# Patient Record
Sex: Male | Born: 1935 | Race: White | Hispanic: No | Marital: Married | Smoking: Never smoker
Health system: Southern US, Community
[De-identification: ages and names within clinical notes are randomized; demographics above are authoritative.]

## PROBLEM LIST (undated history)

## (undated) DIAGNOSIS — N183 Chronic kidney disease, stage 3 unspecified: Secondary | ICD-10-CM

## (undated) DIAGNOSIS — Z8679 Personal history of other diseases of the circulatory system: Secondary | ICD-10-CM

## (undated) DIAGNOSIS — G4700 Insomnia, unspecified: Secondary | ICD-10-CM

## (undated) DIAGNOSIS — I5042 Chronic combined systolic (congestive) and diastolic (congestive) heart failure: Secondary | ICD-10-CM

## (undated) DIAGNOSIS — I351 Nonrheumatic aortic (valve) insufficiency: Secondary | ICD-10-CM

## (undated) DIAGNOSIS — E785 Hyperlipidemia, unspecified: Secondary | ICD-10-CM

## (undated) DIAGNOSIS — M8580 Other specified disorders of bone density and structure, unspecified site: Secondary | ICD-10-CM

## (undated) DIAGNOSIS — R296 Repeated falls: Secondary | ICD-10-CM

## (undated) DIAGNOSIS — I272 Pulmonary hypertension, unspecified: Secondary | ICD-10-CM

## (undated) DIAGNOSIS — I10 Essential (primary) hypertension: Secondary | ICD-10-CM

## (undated) DIAGNOSIS — R5383 Other fatigue: Secondary | ICD-10-CM

## (undated) DIAGNOSIS — G4713 Recurrent hypersomnia: Secondary | ICD-10-CM

## (undated) DIAGNOSIS — H9193 Unspecified hearing loss, bilateral: Secondary | ICD-10-CM

## (undated) DIAGNOSIS — R001 Bradycardia, unspecified: Secondary | ICD-10-CM

## (undated) DIAGNOSIS — F0390 Unspecified dementia without behavioral disturbance: Secondary | ICD-10-CM

## (undated) DIAGNOSIS — I480 Paroxysmal atrial fibrillation: Secondary | ICD-10-CM

## (undated) DIAGNOSIS — I071 Rheumatic tricuspid insufficiency: Secondary | ICD-10-CM

## (undated) HISTORY — DX: Personal history of other diseases of the circulatory system: Z86.79

## (undated) HISTORY — DX: Rheumatic tricuspid insufficiency: I07.1

## (undated) HISTORY — DX: Bradycardia, unspecified: R00.1

## (undated) HISTORY — DX: Recurrent hypersomnia: G47.13

## (undated) HISTORY — DX: Chronic kidney disease, stage 3 unspecified: N18.30

## (undated) HISTORY — DX: Other fatigue: R53.83

## (undated) HISTORY — DX: Paroxysmal atrial fibrillation: I48.0

## (undated) HISTORY — DX: Other specified disorders of bone density and structure, unspecified site: M85.80

## (undated) HISTORY — DX: Insomnia, unspecified: G47.00

## (undated) HISTORY — PX: CATARACT EXTRACTION, BILATERAL: SHX1313

## (undated) HISTORY — DX: Chronic combined systolic (congestive) and diastolic (congestive) heart failure: I50.42

## (undated) HISTORY — DX: Unspecified hearing loss, bilateral: H91.93

## (undated) HISTORY — DX: Essential (primary) hypertension: I10

## (undated) HISTORY — DX: Nonrheumatic aortic (valve) insufficiency: I35.1

## (undated) HISTORY — DX: Pulmonary hypertension, unspecified: I27.20

## (undated) HISTORY — DX: Chronic kidney disease, stage 3 (moderate): N18.3

## (undated) HISTORY — DX: Hyperlipidemia, unspecified: E78.5

## (undated) NOTE — *Deleted (*Deleted)
FLUAD vaccine (from pharmacy) administered in right deltoid. Vaccine administration card given to spouse

---

## 1970-08-30 HISTORY — PX: NASAL SEPTUM SURGERY: SHX37

## 1999-12-15 ENCOUNTER — Ambulatory Visit (HOSPITAL_COMMUNITY): Admission: RE | Admit: 1999-12-15 | Discharge: 1999-12-15 | Payer: Self-pay | Admitting: *Deleted

## 2010-02-26 ENCOUNTER — Ambulatory Visit (HOSPITAL_COMMUNITY): Admission: RE | Admit: 2010-02-26 | Discharge: 2010-02-26 | Payer: Self-pay | Admitting: Internal Medicine

## 2010-05-05 ENCOUNTER — Telehealth (INDEPENDENT_AMBULATORY_CARE_PROVIDER_SITE_OTHER): Payer: Self-pay | Admitting: *Deleted

## 2010-05-06 ENCOUNTER — Ambulatory Visit: Payer: Self-pay

## 2010-05-06 ENCOUNTER — Encounter: Payer: Self-pay | Admitting: Cardiovascular Disease

## 2010-05-06 ENCOUNTER — Ambulatory Visit: Payer: Self-pay | Admitting: Cardiovascular Disease

## 2010-05-06 ENCOUNTER — Encounter (HOSPITAL_COMMUNITY): Admission: RE | Admit: 2010-05-06 | Discharge: 2010-05-19 | Payer: Self-pay | Admitting: Cardiovascular Disease

## 2010-07-01 ENCOUNTER — Ambulatory Visit: Payer: Self-pay | Admitting: Cardiovascular Disease

## 2010-09-29 NOTE — Progress Notes (Signed)
Summary: nuc pre procedure  Phone Note Outgoing Call Call back at Home Phone [phone]   Call placed by: Darrick Penna Call placed to: Patient Reason for Call: Confirm/change Appt Summary of Call: Reviewed information on [employer] Information Sheet (see scanned document for further details).  Spoke with patient.      Nuclear Med Background Indications for Stress Test: Evaluation for Ischemia   History: Echo  History Comments: 06/10 Echo nl, LVFand mild AI     Nuclear Pre-Procedure Cardiac Risk Factors: Hypertension, Lipids

## 2010-09-29 NOTE — Assessment & Plan Note (Signed)
Summary: Cardiology Nuclear Testing  Nuclear Med Background Indications for Stress Test: Evaluation for Ischemia   History: Echo  History Comments: .  Symptoms: DOE, Fatigue    Nuclear Pre-Procedure Cardiac Risk Factors: Hypertension, Lipids Caffeine/Decaff Intake: none NPO After: 7:30 AM Lungs: Clear IV 0.9% NS with Angio Cath: 24g     IV Site: L Wrist IV Started by: Bonnita Levan, RN Chest Size (in) 42     Height (in): 71 Weight (lb): 181 BMI: 25.34  Nuclear Med Study 1 or 2 day study:  1 day     Stress Test Type:  Treadmill/Lexiscan Reading MD:  Charlton Haws, MD     Referring MD:  Kristeen Miss, MD Resting Radionuclide:  Technetium 68m Tetrofosmin     Resting Radionuclide Dose:  11 mCi  Stress Radionuclide:  Technetium 65m Tetrofosmin     Stress Radionuclide Dose:  33 mCi   Stress Protocol Exercise Time (min):  5:30 min     Max HR:  123 bpm     Predicted Max HR:  144 bpm       Percent Max HR:  85.42 % Lexiscan: 0.4 mg   Stress Test Technologist:  [employer], CMA-N     Nuclear Technologist:  Domenic Polite, CNMT  Rest Procedure  Myocardial perfusion imaging was performed at rest 45 minutes following the intravenous administration of Technetium 74m Tetrofosmin.  Stress Procedure  The patient initially walked the treadmill utilizing the Bruce protocol for 5:06, but was unable to get his heart rate up, so he was then given IV Lexiscan 0.4 mg over 15-seconds with concurrent exercise and the technetium 25m Tetrofosmin was injected at 30-seconds.  There were no diagnostic ST-T changes with infusion.  There were occasional PVC's and PAC's and he did have a hypotensive response to infusion, relieved in trendelenburg position.  Quantitative spect images were obtained after a 45 minute delay.  QPS Raw Data Images:  Normal; no motion artifact; normal heart/lung ratio. Stress Images:  Normal homogeneous uptake in all areas of the myocardium. Rest Images:  Normal homogeneous  uptake in all areas of the myocardium. Subtraction (SDS):  Normal Transient Ischemic Dilatation:  0.69  (Normal <1.22)  Lung/Heart Ratio:  0.27  (Normal <0.45)  Quantitative Gated Spect Images QGS EDV:  84 ml QGS ESV:  22 ml QGS EF:  73 % QGS cine images:  normal  Findings Low risk nuclear study Clinically Abnormal (chest pain, ST abnormality, hypotension)      Overall Impression  Exercise Capacity: Poor exercise capacity. Lexiscan given at 5 minutes BP Response: Hypotensive blood pressure response. Clinical Symptoms: Dizzy ECG Impression: No significant ST segment change suggestive of ischemia. Overall Impression: Normal nuclear images.  Significance of hypotension with exercise after Lexiscan unknown

## 2010-11-15 LAB — ACTH STIMULATION, 3 TIME POINTS
Cortisol, 60 Min: 24.6 ug/dL (ref >20)
Cortisol, Base: 20.1 ug/dL

## 2011-05-24 ENCOUNTER — Encounter: Payer: Self-pay | Admitting: Cardiovascular Disease

## 2011-05-27 ENCOUNTER — Ambulatory Visit (INDEPENDENT_AMBULATORY_CARE_PROVIDER_SITE_OTHER): Payer: Medicare Other | Admitting: Cardiovascular Disease

## 2011-05-27 ENCOUNTER — Encounter: Payer: Self-pay | Admitting: Cardiovascular Disease

## 2011-05-27 VITALS — BP 126/80 | HR 80 | Ht 71.0 in | Wt 188.0 lb

## 2011-05-27 DIAGNOSIS — I959 Hypotension, unspecified: Secondary | ICD-10-CM

## 2011-05-27 DIAGNOSIS — I951 Orthostatic hypotension: Secondary | ICD-10-CM

## 2011-05-27 NOTE — Progress Notes (Signed)
Randy Murphy Date of Birth  10/30/1931 [facility] [address]    [address] [place], [place]  [address] [phone]  Fax  [phone]  History of Present Illness:  77 year old gentleman with a history of hypertension in the past. More recently he's been having episodes of hypotension. He was diagnosed as having possible early Parkinson's syndrome. He has occasional episodes of very low blood pressure.  He notes that he is fairly fatigued when he has these episodes of hypotension.   Current Outpatient Prescriptions on File Prior to Visit  Medication Sig Dispense Refill  . ALPRAZolam (XANAX) 0.5 MG tablet Take 0.5 mg by mouth at bedtime as needed.        Marland Kitchen aspirin 81 MG tablet Take 81 mg by mouth daily.        . carbidopa-levodopa (SINEMET) 25-100 MG per tablet Take 1 tablet by mouth 3 (three) times daily.        . Coenzyme Q10 (CO Q 10 PO) Take by mouth daily.        . finasteride (PROSCAR) 5 MG tablet Take 5 mg by mouth daily.        . Flaxseed, Linseed, (FLAX SEED OIL PO) Take by mouth 2 (two) times daily.        Marland Kitchen MAGNESIUM PO Take by mouth daily.        . Omega-3 Fatty Acids (OMEGA 3 PO) Take by mouth 2 (two) times daily. 1000 mg each pill        No Known Allergies  Past Medical History  Diagnosis Date  . Hypertension   . Hyperlipidemia   . History of orthostatic hypotension   . Parkinson's disease     Possible early parkinsons disease  . Fatigue   . Insomnia   . Osteopenia     No past surgical history on file.  History  Smoking status  . Unknown If Ever Smoked  Smokeless tobacco  . Not on file    History  Alcohol Use: Not on file    No family history on file.  Reviw of Systems:  Reviewed in the HPI.  All other systems are negative.  Physical Exam: BP 126/80  Pulse 80  Ht 5\' 11"  (1.803 m)  Wt 188 lb (85.276 kg)  BMI 26.22 kg/m2 The patient is alert and oriented x 3.  The mood and affect are normal.   Skin: warm and dry.  Color is normal.      HEENT:   the sclera are nonicteric.  The mucous membranes are moist.  The carotids are 2+ without bruits.  There is no thyromegaly.  There is no JVD.    Lungs: clear.  The chest wall is non tender.    Heart: regular rate with a normal S1 and S2.  There are no murmurs, gallops, or rubs. The PMI is not displaced.     Abdomen: good bowel sounds.  There is no guarding or rebound.  There is no hepatosplenomegaly or tenderness.  There are no masses.   Extremities:  no clubbing, cyanosis, or edema.  The legs are without rashes.  The distal pulses are intact.   Neuro:  Cranial nerves II - XII are intact.  Motor and sensory functions are intact.    The gait is normal.  ECG:  Assessment / Plan:

## 2011-05-27 NOTE — Assessment & Plan Note (Addendum)
He has Parkinson's disease and I suspect that that is the cause of his episodes of hypotension. He will continue to check his blood pressure. I'll see him again in one year. He'll call me sooner if he starts in lots of blood pressure readings that are less than 90.    I've asked him to drink V8 juice and to stay well hydrated. If he continues to have episodes of hypotension we will need to consider starting Midodrine or perhaps Florinef.

## 2011-05-27 NOTE — Patient Instructions (Signed)
Continue to drink V8 juice to keep your BP stable.

## 2011-11-23 ENCOUNTER — Ambulatory Visit: Payer: Medicare Other | Attending: Internal Medicine | Admitting: Physical Therapy

## 2011-11-23 DIAGNOSIS — G20A1 Parkinson's disease without dyskinesia, without mention of fluctuations: Secondary | ICD-10-CM | POA: Insufficient documentation

## 2011-11-23 DIAGNOSIS — R269 Unspecified abnormalities of gait and mobility: Secondary | ICD-10-CM | POA: Insufficient documentation

## 2011-11-23 DIAGNOSIS — IMO0001 Reserved for inherently not codable concepts without codable children: Secondary | ICD-10-CM | POA: Insufficient documentation

## 2011-11-23 DIAGNOSIS — G20 Parkinson's disease: Secondary | ICD-10-CM | POA: Insufficient documentation

## 2011-11-25 ENCOUNTER — Ambulatory Visit: Payer: Medicare Other | Admitting: Physical Therapy

## 2011-11-30 ENCOUNTER — Ambulatory Visit: Payer: Medicare Other | Attending: Internal Medicine | Admitting: Physical Therapy

## 2011-11-30 DIAGNOSIS — IMO0001 Reserved for inherently not codable concepts without codable children: Secondary | ICD-10-CM | POA: Insufficient documentation

## 2011-11-30 DIAGNOSIS — G20 Parkinson's disease: Secondary | ICD-10-CM | POA: Insufficient documentation

## 2011-11-30 DIAGNOSIS — G20A1 Parkinson's disease without dyskinesia, without mention of fluctuations: Secondary | ICD-10-CM | POA: Insufficient documentation

## 2011-11-30 DIAGNOSIS — R269 Unspecified abnormalities of gait and mobility: Secondary | ICD-10-CM | POA: Insufficient documentation

## 2011-12-01 ENCOUNTER — Ambulatory Visit: Payer: Medicare Other | Admitting: Physical Therapy

## 2011-12-07 ENCOUNTER — Ambulatory Visit: Payer: Medicare Other | Admitting: Physical Therapy

## 2011-12-09 ENCOUNTER — Ambulatory Visit: Payer: Medicare Other | Admitting: Physical Therapy

## 2011-12-14 ENCOUNTER — Ambulatory Visit: Payer: Medicare Other | Admitting: Physical Therapy

## 2011-12-16 ENCOUNTER — Ambulatory Visit: Payer: Medicare Other | Admitting: Physical Therapy

## 2011-12-21 ENCOUNTER — Ambulatory Visit: Payer: Medicare Other | Admitting: Physical Therapy

## 2011-12-23 ENCOUNTER — Ambulatory Visit: Payer: Medicare Other | Admitting: Physical Therapy

## 2012-06-12 ENCOUNTER — Ambulatory Visit (HOSPITAL_BASED_OUTPATIENT_CLINIC_OR_DEPARTMENT_OTHER): Payer: Medicare Other | Attending: Psychiatry | Admitting: Radiology

## 2012-06-12 VITALS — Ht 71.0 in | Wt 184.0 lb

## 2012-06-12 DIAGNOSIS — G4733 Obstructive sleep apnea (adult) (pediatric): Secondary | ICD-10-CM | POA: Insufficient documentation

## 2012-06-17 DIAGNOSIS — G4733 Obstructive sleep apnea (adult) (pediatric): Secondary | ICD-10-CM

## 2012-06-17 NOTE — Procedures (Signed)
NAME:  Randy, PIENTA NO.:  [id number]  MEDICAL RECORD NO.:  [id number]          PATIENT TYPE:  OUT  LOCATION:  SLEEP CENTER                 FACILITY:  [facility]  PHYSICIAN:  Rubina Basinski D. Maple Hudson, MD, FCCP, FACPDATE OF BIRTH:  1931/10/02  DATE OF STUDY:  06/12/2012                           NOCTURNAL POLYSOMNOGRAM  REFERRING PHYSICIAN:  IHTSHAM U Murphy  This is an unattended home sleep study.  INDICATION FOR STUDY:  A 78 year old man referred for complaints of excessive daytime somnolence and fatigue with possible insomnia and sleep apnea.  EPWORTH SLEEPINESS SCORE:  8/24.  BMI 26, weight 184 pounds, height 5 feet 11 inches, neck 17 inches.  MEDICATIONS:  Home medications are charted and reviewed.  SLEEP ARCHITECTURE AND RESPIRATORY DATA:  See document in Epic media file.  IMPRESSION: 1. Mild obstructive sleep apnea/hypopnea syndrome, AHI 6 per hour.     The normal range for adults is from 0-5 per hour. 2. Snoring was not recorded by the monitor equipment.  Oxygen     desaturation to a nadir of 88%, with a mean saturation through the     study of 91% on room air. 3. Average heart rate of 59 per minute with maximum of 81 per minute.     Rhythm analysis not available.  RECOMMENDATIONS: 1. The patient indicated he slept poorly on the study night and felt     he was awake much of the night.  Sleep stage assessment is not     provided with this limited monitoring format.  Consider if     difficulty initiating and maintaining sleep (insomnia) is a     significant component of his sleep complaint.  A full sleep center     overnight NPSG study could be an option if appropriate. 2. AHI scores in this range would usually be addressed with     conservative measures if intervention is needed.     Randy Murphy D. Maple Hudson, MD, [facility], FACP Diplomate, American Board of Sleep Medicine   CDY/MEDQ  D:  06/17/2012 10:21:36  T:  06/17/2012 11:55:34  Job:  [id number]

## 2014-02-18 ENCOUNTER — Encounter: Payer: Self-pay | Admitting: Neurology

## 2014-02-18 ENCOUNTER — Ambulatory Visit (INDEPENDENT_AMBULATORY_CARE_PROVIDER_SITE_OTHER): Payer: Medicare Other | Admitting: Neurology

## 2014-02-18 ENCOUNTER — Encounter (INDEPENDENT_AMBULATORY_CARE_PROVIDER_SITE_OTHER): Payer: Self-pay

## 2014-02-18 VITALS — BP 150/75 | HR 63 | Ht 71.0 in | Wt 181.0 lb

## 2014-02-18 DIAGNOSIS — G238 Other specified degenerative diseases of basal ganglia: Secondary | ICD-10-CM

## 2014-02-18 DIAGNOSIS — G903 Multi-system degeneration of the autonomic nervous system: Secondary | ICD-10-CM

## 2014-02-18 DIAGNOSIS — H9190 Unspecified hearing loss, unspecified ear: Secondary | ICD-10-CM

## 2014-02-18 DIAGNOSIS — Z8619 Personal history of other infectious and parasitic diseases: Secondary | ICD-10-CM | POA: Insufficient documentation

## 2014-02-18 DIAGNOSIS — H9193 Unspecified hearing loss, bilateral: Secondary | ICD-10-CM

## 2014-02-18 DIAGNOSIS — G4713 Recurrent hypersomnia: Secondary | ICD-10-CM | POA: Insufficient documentation

## 2014-02-18 DIAGNOSIS — I951 Orthostatic hypotension: Secondary | ICD-10-CM

## 2014-02-18 DIAGNOSIS — Z8661 Personal history of infections of the central nervous system: Secondary | ICD-10-CM

## 2014-02-18 DIAGNOSIS — G20 Parkinson's disease: Secondary | ICD-10-CM | POA: Insufficient documentation

## 2014-02-18 HISTORY — DX: Recurrent hypersomnia: G47.13

## 2014-02-18 NOTE — Patient Instructions (Signed)
Hypersomnia Hypersomnia usually brings recurrent episodes of excessive daytime sleepiness or prolonged nighttime sleep. It is different than feeling tired due to lack of or interrupted sleep at night. People with hypersomnia are compelled to nap repeatedly during the day. This is often at inappropriate times such as:  At work.  During a meal.  In conversation. These daytime naps usually provide no relief. This disorder typically affects adolescents and young adults. CAUSES  This condition may be caused by:  Another sleep disorder (such as narcolepsy or sleep apnea).  Dysfunction of the autonomic nervous system.  Drug or alcohol abuse.  A physical problem, such as:  A tumor.  Head trauma. This is damage caused by an accident.  Injury to the central nervous system.  Certain medications, or medicine withdrawal.  Medical conditions may contribute to the disorder, including:  Multiple sclerosis.  Depression.  Encephalitis.  Epilepsy.  Obesity.  Some people appear to have a genetic predisposition to this disorder. In others, there is no known cause. SYMPTOMS   Patients often have difficulty waking from a long sleep. They may feel dazed or confused.  Other symptoms may include:  Anxiety.  Increased irritation (inflammation).  Decreased energy.  Restlessness.  Slow thinking.  Slow speech.  Loss of appetite.  Hallucinations.  Memory difficulty.  Tremors, Tics.  Some patients lose the ability to function in family, social, occupational, or other settings. TREATMENT  Treatment is symptomatic in nature. Stimulants and other drugs may be used to treat this disorder. Changes in behavior may help. For example, avoid night work and social activities that delay bed time. Changes in diet may offer some relief. Patients should avoid alcohol and caffeine. PROGNOSIS  The likely outcome (prognosis) for persons with hypersomnia depends on the cause of the disorder.  The disorder itself is not life threatening. But it can have serious consequences. For example, automobile accidents can be caused by falling asleep while driving. The attacks usually continue indefinitely. Document Released: 08/06/2002 Document Revised: 11/08/2011 Document Reviewed: 07/10/2008 ExitCare Patient Information 2015 [employer]. This information is not intended to replace advice given to you by your health care provider. Make sure you discuss any questions you have with your health care provider.  

## 2014-02-18 NOTE — Addendum Note (Signed)
Addended by: Melvyn NovasHMEIER, Leigh Kaeding on: 02/18/2014 02:42 PM   Modules accepted: Orders

## 2014-02-18 NOTE — Progress Notes (Signed)
[facility]  Provider:  Melvyn Murphy  Randy Murphy  Referring Provider: Ezequiel Murphy, Randy Murphy Primary Care Physician:  Randy Murphy,Randy Murphy  Chief Complaint  Patient presents with  . New Evaluation    Room 10  . Sleep consult    HPI:  Randy Murphy is Murphy 80 y.o. righthanded, married, caucasian  male , who  is seen here as Murphy referral upon his wifes request, for evaluation of excessive daytime sleepiness and nocturnal insomnia.    Dr. Laurey MoralePerini's notes indicate medical history of regional syndrome, hyperlipidemia, benign prostatic hyperplasia, probably whitecoat phenomenon as his blood pressure has only been elevated when measured in the office.  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  In 2011 he was diagnosed with parkinson's disease . [facility], documented small strokes lacunes - He was placed on Aspirin daily by his neurologist, Randy Murphy .  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 He is not drinking anymore since 2011.  The patent usually goes to bed at 9 PM but watches TV in bed, before he sleeps around 10 Pm. He is waking up at 12 , 2 AM and 4 AM with nocturia. 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 Estimated his usual overnight sleep time at 6 hours only, and in daytime struggles with fatigue.  He has some dysautonomia , orthostatic hypotension, Reynauds and cold hands- not affecting his feet. He has nocturnal leg spasms , cramps.  He exercises on Murphy treat mill daily and than walks his dogs outdoors. In the afternoon he frequently watches TV and sleeps in front of it, his wife reports.  He feels  fatigued when waking in the morning. He has one cup of coffee in AM. He drives for pleasure, only neighborhood area, [place].      Review of Systems: Out of Murphy complete 14 system review, the patient complains of only the following symptoms, and all other reviewed systems are negative. REM BD RLS, cramping . Reynauds. Masked face , dysphonia.  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 He never noted improvement on sinemet.   History   Social History  . Marital Status: Married    Spouse Name: Randy Murphy    Number of Children: 1  . Years of Education: 16   Occupational History  . Not on file.   Social History Main Topics  . Smoking status: Never Smoker   . Smokeless tobacco: Never Used  . Alcohol Use: No  . Drug Use: No  . Sexual Activity: Not on file   Other Topics Concern  . Not on file   Social History Narrative   Patient is married Randy Murphy(Randy Murphy) and lives at home with his wife.   Patient has one adult child.   Patient is retired.   Patient has Murphy college education.   Patient is right-handed.   Patient drinks one cup of coffee daily.    History reviewed. No pertinent family history.  Past Medical History  Diagnosis Date  . Hypertension   . Hyperlipidemia   .  History of orthostatic hypotension   . Parkinson's disease     Possible early parkinsons disease  . Fatigue   . Insomnia   . Osteopenia   . Hearing loss of both ears   . Hypersomnia, periodic 02/18/2014    Past Surgical History  Procedure Laterality Date  . Nasal septum surgery  1972    Current Outpatient Prescriptions  Medication Sig Dispense Refill  . aspirin 81 MG tablet Take 81 mg by mouth daily.        . carbidopa-levodopa (SINEMET) 25-100 MG per tablet Take 1 tablet by mouth 3 (three) times daily.        . Cholecalciferol (VITAMIN Murphy PO) Take by mouth.        . clonazePAM (KLONOPIN) 0.5 MG tablet Take 0.5 mg by mouth at bedtime.      . Coenzyme Q10 (CO  Q 10 PO) Take by mouth daily.        . diphenhydrAMINE (SOMINEX) 25 MG tablet Take 25 mg by mouth daily.      Marland Kitchen donepezil (ARICEPT) 10 MG tablet Take 10 mg by mouth at bedtime.      . finasteride (PROSCAR) 5 MG tablet Take 5 mg by mouth daily.        . Flaxseed, Linseed, (FLAX SEED OIL PO) Take by mouth 2 (two) times daily.        Marland Kitchen MAGNESIUM PO Take by mouth daily.        . Misc Natural Products (PROSTATE HEALTH PO) Take 1 tablet by mouth daily.      . Multiple Vitamins-Minerals (ICAPS AREDS FORMULA PO) Take 1 capsule by mouth 2 (two) times daily.      . Multiple Vitamins-Minerals (MULTIVITAMIN PO) Take 1 tablet by mouth daily.      . Probiotic Product (ALIGN PO) Take 1 tablet by mouth daily.      . sertraline (ZOLOFT) 50 MG tablet Take 50 mg by mouth daily.       No current facility-administered medications for this visit.    Allergies as of 02/18/2014  . (No Known Allergies)    Vitals: BP 150/75  Pulse 63  Ht 5\' 11"  (1.803 m)  Wt 181 lb (82.101 kg)  BMI 25.26 kg/m2 Last Weight:  Wt Readings from Last 1 Encounters:  02/18/14 181 lb (82.101 kg)   Last Height:   Ht Readings from Last 1 Encounters:  02/18/14 5\' 11"  (1.803 m)     Physical exam:  General: The patient is awake, alert and appears not in acute distress. The patient is well groomed. Head: Normocephalic, atraumatic. Neck is supple. Mallampati 2 . Lower on the left , neck circumference: 16.5 inches.  Cardiovascular:  Regular rate and rhythm , without  murmurs or carotid bruit, and without distended neck veins. Respiratory: Lungs are clear to auscultation. Skin:  Without evidence of edema, or rash Trunk: BMI is normal . Hunched posture.  Neurologic exam : The patient is awake and alert, oriented to place and time.  Memory subjective  described as intact. There is Murphy normal attention span & concentration ability.  Speech is fluent with dysphonia . Mood and affect are appropriate.  Cranial nerves: Pupils are equal  and briskly reactive to light. Funduscopic exam without evidence of pallor or edema.  Extraocular movements  in vertical and horizontal planes with coarse saccades and without nystagmus- . Visual fields by finger perimetry are intact. Hearing to finger rub intact.  Facial sensation intact to fine touch. Facial  motor strength - masked face , hypomimia.,symmetric . His  tongue moves midline.  Motor exam:  Cog wheeling over right more than left biceps, elevated  tone but normal muscle bulk and symmetric normal strength in all extremities.  He reports loss of grip strength when opening Murphy jar.  Sensory:  Fine touch, pinprick and vibration were tested in all extremities.  Proprioception is tested in the upper extremities only. This was normal.  Coordination: Rapid alternating movements in the fingers/hands are slowed.  Finger-to-nose maneuver tested - there is no dysmetria and no tremor.  Gait and station: Patient walks without assistive device - stooped posture. Strength within normal limits. Stance is stable and normal.  Deep tendon reflexes: in the  upper and lower extremities are symmetric and intact. Babinski maneuver response is  downgoing.   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   Plan:  Treatment plan and additional workup : 1)  SPLIT study to evaluate for hypoxemia or central apnea.  The patient has REM BD- needs parasomnia montage and CO2. 2) follow yearly with Randy Murphy , I will follow locally.

## 2014-03-28 ENCOUNTER — Ambulatory Visit
Admission: RE | Admit: 2014-03-28 | Discharge: 2014-03-28 | Disposition: A | Discharge status: Home / Self Care | Payer: Medicare Other | Source: Ambulatory Visit | Attending: Neurology | Admitting: Neurology

## 2014-03-28 DIAGNOSIS — I951 Orthostatic hypotension: Secondary | ICD-10-CM

## 2014-03-28 DIAGNOSIS — G903 Multi-system degeneration of the autonomic nervous system: Secondary | ICD-10-CM

## 2014-03-28 DIAGNOSIS — G4713 Recurrent hypersomnia: Secondary | ICD-10-CM

## 2014-03-28 DIAGNOSIS — G890 Central pain syndrome: Secondary | ICD-10-CM

## 2014-03-28 DIAGNOSIS — Z8661 Personal history of infections of the central nervous system: Secondary | ICD-10-CM

## 2014-03-28 DIAGNOSIS — H9193 Unspecified hearing loss, bilateral: Secondary | ICD-10-CM

## 2014-03-28 DIAGNOSIS — Z8619 Personal history of other infectious and parasitic diseases: Secondary | ICD-10-CM

## 2014-03-31 ENCOUNTER — Ambulatory Visit (INDEPENDENT_AMBULATORY_CARE_PROVIDER_SITE_OTHER): Payer: Medicare Other | Admitting: Neurology

## 2014-03-31 DIAGNOSIS — G903 Multi-system degeneration of the autonomic nervous system: Secondary | ICD-10-CM

## 2014-03-31 DIAGNOSIS — H9193 Unspecified hearing loss, bilateral: Secondary | ICD-10-CM

## 2014-03-31 DIAGNOSIS — G4710 Hypersomnia, unspecified: Secondary | ICD-10-CM

## 2014-03-31 DIAGNOSIS — R0902 Hypoxemia: Secondary | ICD-10-CM

## 2014-03-31 DIAGNOSIS — G4713 Recurrent hypersomnia: Secondary | ICD-10-CM

## 2014-03-31 DIAGNOSIS — I951 Orthostatic hypotension: Secondary | ICD-10-CM

## 2014-04-04 ENCOUNTER — Encounter: Payer: Self-pay | Admitting: Neurology

## 2014-04-04 NOTE — Progress Notes (Signed)
Quick Note:  Left message with MRI brain results showing age related changes in brain size and small vessel disease, no focal abnormalities, per Dr. Kandyce Rudomeier. Told to call with further questions. ______

## 2014-04-10 NOTE — Sleep Study (Signed)
See media tab for full report  

## 2014-04-29 ENCOUNTER — Telehealth: Payer: Self-pay | Admitting: Neurology

## 2014-04-29 NOTE — Telephone Encounter (Signed)
Called the patient and left a message for him or his wife to return a call to the office so that sleep study results could be provided.

## 2015-02-24 ENCOUNTER — Other Ambulatory Visit: Payer: Self-pay

## 2017-07-25 ENCOUNTER — Encounter: Payer: Self-pay | Admitting: Neurology

## 2017-08-04 NOTE — Progress Notes (Signed)
Randy Murphy was seen today in the movement disorders clinic for neurologic consultation at the request of Dr. Tonye Royalty.  His PCP is Crist Infante, MD.  This patient is accompanied in the office by his wife who supplements the history.The consultation is for the evaluation of PD.  Pt has also previously seen Dr. Brett Fairy.  I have reviewed neurology records made available to me.  The patient last saw Dr. Tonye Royalty on July 14, 2017.  Records from initial diagnosis are not available.  Patient reports that he was diagnosed in approximately 2011.  His first symptom was falls.  He is currently on carbidopa/levodopa 25/100, 2 tablets four times per day (8am/noon/5pm/3am).  He automatically wakes up at 3am and then takes his medication.  He isn't sure that medication makes a difference (appears that did on/off test and proved efficacy of carbidopa/levodopa at [facility])  Specific Symptoms:  Tremor: No. Family hx of similar:  No. Voice: yes, weaker Sleep: trouble staying asleep  Vivid Dreams:  No.  Acting out dreams:  Yes.  , minor Wet Pillows: Yes.  , minor Postural symptoms:  Yes.    Falls?  Yes.  , last fall was 2 days ago and went down for no reason Bradykinesia symptoms: slow movements and difficulty getting out of a chair; walks fast with walker Loss of smell:  Yes.   Loss of taste:  No. Urinary Incontinence:  No., but has urgency Difficulty Swallowing:  No. Handwriting, micrographia: Yes.   Trouble with ADL's:  No.  Trouble buttoning clothing: Yes.   Depression:  No. Memory changes:  Yes.   (on donepezil; wife takes care of monthly finances; uses pillbox that wife prepares for long time;  Wife drives; pt quit driving x 3-4 years; wife cooks) Hallucinations:  No.  visual distortions: No. N/V:  No. Lightheaded:  Yes.    Syncope: Yes.   (unsure if actually passed out in the past) Diplopia:  No. Dyskinesia:  Yes.   (happens daily to some degree.  Wife notes it at dinner time.  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  Catapres has not been of value for this.  Neuroimaging of the brain has previously been performed.  It is available for my review today.  He had one done on 03/29/14.  There is atrophy and mod small vessel disease.   PREVIOUS MEDICATIONS:  Catapres (for sweating, no help)  ALLERGIES:  No Known Allergies  CURRENT MEDICATIONS:  Outpatient Encounter Medications as of 08/05/2017  Medication Sig  . aspirin 81 MG tablet Take 81 mg by mouth daily.    . carbidopa-levodopa (SINEMET) 25-100 MG per tablet Take 2.5 tablets by mouth 4 (four) times daily. 3 am/ 8 am/ 12 pm/ 5 pm  . Cholecalciferol (VITAMIN D PO) Take by mouth.    . clonazePAM (KLONOPIN) 0.5 MG tablet Take 0.5 mg by mouth at bedtime.  . Coenzyme Q10 (CO Q 10 PO) Take by mouth daily.    Marland Kitchen donepezil (ARICEPT) 10 MG tablet Take 10 mg by mouth at bedtime.  . finasteride (PROSCAR) 5 MG tablet Take 5 mg by mouth daily.    . Flaxseed, Linseed, (FLAX SEED OIL PO) Take by mouth 2 (two) times daily.    Marland Kitchen MAGNESIUM PO Take by mouth daily.    . Melatonin 5 MG TABS Take 5 mg by mouth daily.  . Multiple Vitamins-Minerals (ICAPS AREDS FORMULA PO) Take  1 capsule by mouth 2 (two) times daily.  . Multiple Vitamins-Minerals (MULTIVITAMIN PO) Take 1 tablet by mouth daily.  . Probiotic Product (ALIGN PO) Take 1 tablet by mouth daily.  . TURMERIC PO Take by mouth.  . [DISCONTINUED] diphenhydrAMINE (SOMINEX) 25 MG tablet Take 25 mg by mouth daily.  . [DISCONTINUED] Misc Natural Products (PROSTATE HEALTH PO) Take 1 tablet by mouth daily.  . [DISCONTINUED] sertraline (ZOLOFT) 50 MG tablet Take 50 mg by mouth daily.   No facility-administered encounter medications on file as of 08/05/2017.     PAST MEDICAL HISTORY:   Past Medical History:  Diagnosis Date  . Fatigue   . Hearing loss of both ears   . History of orthostatic hypotension   . Hyperlipidemia   .  Hypersomnia, periodic 02/18/2014  . Hypertension   . Insomnia   . Osteopenia   . Parkinson's disease    Possible early parkinsons disease    PAST SURGICAL HISTORY:   Past Surgical History:  Procedure Laterality Date  . CATARACT EXTRACTION, BILATERAL    . NASAL SEPTUM SURGERY  1972    SOCIAL HISTORY:   Social History   Socioeconomic History  . Marital status: Married    Spouse name: [name]  . Number of children: 1  . Years of education: 10  . Highest education level: Not on file  Social Needs  . Financial resource strain: Not on file  . Food insecurity - worry: Not on file  . Food insecurity - inability: Not on file  . Transportation needs - medical: Not on file  . Transportation needs - non-medical: Not on file  Occupational History  . Not on file  Tobacco Use  . Smoking status: Never Smoker  . Smokeless tobacco: Never Used  Substance and Sexual Activity  . Alcohol use: No  . Drug use: No  . Sexual activity: Not on file  Other Topics Concern  . Not on file  Social History Narrative   Patient is married [name]) and lives at home with his wife.   Patient has one adult child.   Patient is retired.   Patient has a college education.   Patient is right-handed.   Patient drinks one cup of coffee daily.    FAMILY HISTORY:   Family Status  Relation Name Status  . Mother died at 59 Deceased  . Father influenza Deceased  . Sister  Alive  . Son  Alive    ROS:  A complete 10 system review of systems was obtained and was unremarkable apart from what is mentioned above.  PHYSICAL EXAMINATION:    VITALS:   Vitals:   08/05/17 1339  BP: (!) 128/92  Pulse: 74  SpO2: 96%  Weight: 187 lb (84.8 kg)  Height: '5\' 11"'  (1.803 m)    GEN:  The patient appears stated age and is in NAD. HEENT:  Normocephalic, atraumatic.  The mucous membranes are moist. The superficial temporal arteries are without ropiness or tenderness. CV:  RRR Lungs:  CTAB Neck/HEME:  There are no  carotid bruits bilaterally.  Neurological examination:  Orientation: The patient is alert and oriented x3.  I did I did not repeat his Moca since it was just done at [facility] a few weeks ago.  At that point in time his Moca was 12/30. Cranial nerves: There is good facial symmetry. Pupils are equal round and minimally reactive to light bilaterally. Fundoscopic exam is attempted but the disc margins are not well visualized bilaterally. Extraocular muscles  are intact. The visual fields are full to confrontational testing. The speech is fluent and clear. He is hypophonic.  Soft palate rises symmetrically and there is no tongue deviation. Hearing is intact to conversational tone. Sensation: Sensation is intact to light and pinprick throughout (facial, trunk, extremities). Vibration is decreased at the bilateral big toe. There is no extinction with double simultaneous stimulation. There is no sensory dermatomal level identified. Motor: Strength is 5/5 in the bilateral upper and lower extremities.   Shoulder shrug is equal and symmetric.  There is no pronator drift. Deep tendon reflexes: Deep tendon reflexes are 2/4 at the bilateral biceps, triceps, brachioradialis, patella and achilles. Plantar responses are downgoing bilaterally.  Movement examination: Tone: There is normal tone in the bilateral upper extremities.  The tone in the lower extremities is normal.  Abnormal movements: None Coordination:  There is no decremation with RAM's, with any form of RAMS, including alternating supination and pronation of the forearm, hand opening and closing, finger taps, heel taps and toe taps.  He does have some apraxia with rapid alternating movements Gait and Station: The patient has no difficulty arising out of a deep-seated chair without the use of the hands. The patient's stride length is just slightly decreased.  Arm swing is fairly good.    ASSESSMENT/PLAN:  1.   Akinetic idiopathic Parkinson's disease.  The  patient was diagnosed in approximately 2011  -We discussed the diagnosis as well as pathophysiology of the disease.  We discussed treatment options as well as prognostic indicators.  Patient education was provided.  -We discussed that it used to be thought that levodopa would increase risk of melanoma but now it is believed that Parkinsons itself likely increases risk of melanoma. he is to get regular skin checks.  -Greater than 50% of the 60 minute visit was spent in counseling answering questions and talking about what to expect now as well as in the future.  We talked about medication options as well as potential future surgical options.  We talked about safety in the home.  -The patient will continue carbidopa/levodopa 25/100, 2 tablets 4 times per day.  Patient states he is not sure if medication works.  However, it appears that on/off testing at [facility] demonstrate efficacy of medication.  He takes the fourth dosage he is already going to in the middle of the night.  I am not really sure he needs that, as he is really not complaining about symptoms in the middle of the night.  However, I did not change that today.  -I will refer the patient [facility], for PT/ST (including cognitive therapy).  We talked about the importance of safe, cardiovascular exercise in Parkinson's disease.  -We discussed community resources in the area including patient support groups and community exercise programs for PD and pt education was provided to the patient.  He is already going [facility] but encouraged him to go more than 1 day/week.   Also talked about rock steady boxing   -His wife does have respite care  -Met with our social worker today.  Discussed Parkinson's caregiver support group  -His Parkinson's disease has been complicated by dysautonomia (sweating).  Catapres was attempted for this without success.  2.  Memory Loss  -I do think that the patient has  Parkinsons disease dementia.  His Moca was 12/30 in November, 2018.  This is a common part of Parkinson's disease and we discussed this today.  We talked about  the nature, etiology and pathophysiology as well as the degenerative nature.  The patient is on Aricept.  We discussed the importance of mental and physical exercise in the treatment of dementia, and discussed in detail what these things mean.  We discussed community resources and addressed safety in the home.  We discussed caregiver support and caregiver resources.  We offered our PD social worker as part of a support system.  -I am concerned about day/night reversal.  Talked about regular schedule.    3.  Follow up is anticipated in the next few months, sooner should new neurologic issues arise.  Time above did not include the 40 min of record review which was detailed above, which was non face to face time.    Cc:  Crist Infante, MD

## 2017-08-05 ENCOUNTER — Ambulatory Visit: Payer: Medicare Other | Admitting: Neurology

## 2017-08-05 ENCOUNTER — Encounter: Payer: Self-pay | Admitting: Neurology

## 2017-08-05 VITALS — BP 128/92 | HR 74 | Ht 71.0 in | Wt 187.0 lb

## 2017-08-05 DIAGNOSIS — G20 Parkinson's disease: Secondary | ICD-10-CM | POA: Diagnosis not present

## 2017-08-05 DIAGNOSIS — G901 Familial dysautonomia [Riley-Day]: Secondary | ICD-10-CM | POA: Diagnosis not present

## 2017-08-05 DIAGNOSIS — F0280 Dementia in other diseases classified elsewhere without behavioral disturbance: Secondary | ICD-10-CM

## 2017-08-05 NOTE — Patient Instructions (Signed)
1. You have been referred to [facility] for physical and speech therapy. They will call you directly to schedule an appointment.  Please call [phone] if you do not hear from them.    

## 2017-08-29 ENCOUNTER — Other Ambulatory Visit: Payer: Self-pay | Admitting: Neurology

## 2017-08-29 MED ORDER — DONEPEZIL HCL 10 MG PO TABS: 10.0000 mg | ORAL_TABLET | Freq: Every day | ORAL | 1 refills | Status: DC

## 2017-09-02 ENCOUNTER — Telehealth: Payer: Self-pay | Admitting: Neurology

## 2017-09-02 MED ORDER — CLONAZEPAM 0.5 MG PO TABS: 0.5000 mg | ORAL_TABLET | Freq: Every day | ORAL | 1 refills | Status: DC

## 2017-09-02 NOTE — Telephone Encounter (Signed)
RX faxed to [facility].  

## 2017-09-02 NOTE — Telephone Encounter (Signed)
yes

## 2017-09-02 NOTE — Telephone Encounter (Signed)
Find out from patient/wife why taking.  There is some reported RBD in [facility] records but patient/wife said only mild acting out of dreams when here (maybe because he was on med).

## 2017-09-02 NOTE — Telephone Encounter (Signed)
Received request from [facility] to refill Clonazepam 0.5 mg - one at bedtime. You have never prescribed medication. Please advise on refilling.

## 2017-09-02 NOTE — Telephone Encounter (Signed)
I spoke with patient's wife and he does take it for sleep and RBD. Okay to fill?

## 2017-09-13 ENCOUNTER — Ambulatory Visit: Payer: Medicare Other | Attending: Neurology | Admitting: Physical Therapy

## 2017-09-13 ENCOUNTER — Ambulatory Visit: Payer: Medicare Other | Admitting: Speech Pathology

## 2017-09-13 ENCOUNTER — Other Ambulatory Visit: Payer: Self-pay

## 2017-09-13 ENCOUNTER — Encounter: Payer: Self-pay | Admitting: Physical Therapy

## 2017-09-13 DIAGNOSIS — R293 Abnormal posture: Secondary | ICD-10-CM | POA: Diagnosis present

## 2017-09-13 DIAGNOSIS — R2681 Unsteadiness on feet: Secondary | ICD-10-CM | POA: Diagnosis present

## 2017-09-13 DIAGNOSIS — R471 Dysarthria and anarthria: Secondary | ICD-10-CM | POA: Diagnosis present

## 2017-09-13 DIAGNOSIS — R2689 Other abnormalities of gait and mobility: Secondary | ICD-10-CM | POA: Insufficient documentation

## 2017-09-13 NOTE — Therapy (Signed)
[facility] [address] [place], [place], [address] Phone: [phone]   Fax:  [phone]  Physical Therapy Evaluation  Patient Details  Name: Randy Murphy MRN: [id number] Date of Birth: 10/22/1940 Referring Provider: Kerin Salen   Encounter Date: 09/13/2017  PT End of Session - 09/13/17 2244    Visit Number  1    Number of Visits  17    Date for PT Re-Evaluation  11/12/17    Authorization Type  [employer] Medicare    PT Start Time  1149    PT Stop Time  1231    PT Time Calculation (min)  42 min    Activity Tolerance  Patient tolerated treatment well    Behavior During Therapy  [facility] for tasks assessed/performed       Past Medical History:  Diagnosis Date  . Fatigue   . Hearing loss of both ears   . History of orthostatic hypotension   . Hyperlipidemia   . Hypersomnia, periodic 02/18/2014  . Hypertension   . Insomnia   . Osteopenia   . Parkinson's disease    Possible early parkinsons disease    Past Surgical History:  Procedure Laterality Date  . CATARACT EXTRACTION, BILATERAL    . NASAL SEPTUM SURGERY  1972    There were no vitals filed for this visit.   Subjective Assessment - 09/13/17 1156    Subjective  Saw Dr. Arbutus Leas recently, and she referred me here.  Wife reports that its a good thing because his balance is getting worse.  He uses rollator at times, but does not have it with him today.  Pt reports 4-5 falls in the past 6 months.  Falls sometimes occur with just standing there.  Once on the floor, has to call EMS to help get up.    Patient is accompained by:  Family member    Pertinent History  PD diagnosis 2011 (recently evaluated by Dr. Arbutus Leas; has been seen by Dr. Zola Button); osteopenia, HTN, orthostatic hypotension; HOH bilateral    Patient Stated Goals  Pt's goals for physical therapy are to improve balance and moving through house (without walker)    Currently in Pain?  No/denies         [facility] PT  Assessment - 09/13/17 1202      Assessment   Medical Diagnosis  Parkinson's disease    Referring Provider  Tat, Lurena Joiner    Onset Date/Surgical Date  08/05/17      Precautions   Precautions  Fall    Precaution Comments  Is not driving      Balance Screen   Has the patient fallen in the past 6 months  Yes    How many times?  4-5    Has the patient had a decrease in activity level because of a fear of falling?   Yes    Is the patient reluctant to leave their home because of a fear of falling?   Yes      Home Environment   Living Environment  Private residence    Living Arrangements  Spouse/significant other    Available Help at Discharge  Family    Type of Home  House    Home Access  Stairs to enter    Entrance Stairs-Number of Steps  4    Entrance Stairs-Rails  Right;Left    Home Layout  One level    Home Equipment  Walker - 4 wheels;Walker - 2 wheels;Cane - single point  walking poles      Prior Function   Level of Independence  Independent with household mobility with device;Independent with household mobility without device;Independent with community mobility with device Needs assist getting out of shower, with buttons    Vocation  Retired    Leisure  Goes to [employer] once per week, for strengthening; does treadmill every day, walk in the park 30 minutes most days in the park      Cognition   Overall Cognitive Status  History of cognitive impairments - at baseline Per MD report:  MOCA 12/30-PD dementia?      Observation/Other Assessments   Focus on Therapeutic Outcomes (FOTO)   NA      Posture/Postural Control   Posture/Postural Control  Postural limitations    Postural Limitations  Forward head;Rounded Shoulders;Increased thoracic kyphosis;Weight shift left flexed knees bilaterally    Posture Comments  In sitting, pt noted to have dyskinesias in trunk/head      Tone   Assessment Location  --      ROM / Strength   AROM / PROM / Strength  Strength      Strength    Overall Strength Comments  Grossly tested at least 4/5      Transfers   Transfers  Sit to Stand;Stand to Sit    Sit to Stand  6: Modified independent (Device/Increase time);Without upper extremity assist;From chair/3-in-1;5: Supervision    Five time sit to stand comments   13.88 posterior lean on second trial    Stand to Sit  6: Modified independent (Device/Increase time);5: Supervision;Without upper extremity assist;To chair/3-in-1      Ambulation/Gait   Ambulation/Gait  Yes    Ambulation/Gait Assistance  5: Supervision    Ambulation/Gait Assistance Details  No device used during PT eval today; but pt reports using rollator at home and in community    Ambulation Distance (Feet)  200 Feet    Assistive device  None    Gait Pattern  Step-through pattern;Decreased trunk rotation;Narrow base of support    Ambulation Surface  Level;Unlevel    Gait velocity  10.75 sec (3.05 ft/sec); 2nd trial:  8.75 sec (3.75 ft/sec)    Gait velocity - backwards  26.56 sec for 20 ft (0.75 ft/sec)      Standardized Balance Assessment   Standardized Balance Assessment  Timed Up and Go Test      Timed Up and Go Test   Normal TUG (seconds)  12    Cognitive TUG (seconds)  17.59    TUG Comments  Scores >10% in TUG and cognitive TUG indicate difficulty with dual tasking.      High Level Balance   High Level Balance Comments  MiniBEStest score:  12/28 (Scores <22/28 indicate increased fall risk)         Mini-BESTest: Balance Evaluation Systems Test  2005-2013 [facility] & [employer]. All rights reserved. ________________________________________________________________________________________Anticipatory_________Subscore__3___/6 1. SIT TO STAND Instruction: "Cross your arms across your chest. Try not to use your hands unless you must.Do not let your legs lean against the back of the chair when you stand. Please stand up now." X(2) Normal: Comes to stand without use of hands and stabilizes  independently. (1) Moderate: Comes to stand WITH use of hands on first attempt. (0) Severe: Unable to stand up from chair without assistance, OR needs several attempts with use of hands. 2. RISE TO TOES Instruction: "Place your feet shoulder width apart. Place your hands on your hips. Try to rise as high  as you can onto your toes. I will count out loud to 3 seconds. Try to hold this pose for at least 3 seconds. Look straight ahead. Rise now." (2) Normal: Stable for 3 s with maximum height. (1) Moderate: Heels up, but not full range (smaller than when holding hands), OR noticeable instability for 3 s. X(0) Severe: < 3 s. 3. STAND ON ONE LEG Instruction: "Look straight ahead. Keep your hands on your hips. Lift your leg off of the ground behind you without touching or resting your raised leg upon your other standing leg. Stay standing on one leg as long as you can. Look straight ahead. Lift now." Left: Time in Seconds Trial 1:___1.78__Trial 2:__0.78___ (2) Normal: 20 s. X(1) Moderate: < 20 s. (0) Severe: Unable. Right: Time in Seconds Trial 1:__0.75___Trial 2:__2.47___ (2) Normal: 20 s. X(1) Moderate: < 20 s. (0) Severe: Unable To score each side separately use the trial with the longest time. To calculate the sub-score and total score use the side [left or right] with the lowest numerical score [i.e. the worse side]. ______________________________________________________________________________________Reactive Postural Control___________Subscore:___2__/6 4. COMPENSATORY STEPPING CORRECTION- FORWARD Instruction: "Stand with your feet shoulder width apart, arms at your sides. Lean forward against my hands beyond your forward limits. When I let go, do whatever is necessary, including taking a step, to avoid a fall." (2) Normal: Recovers independently with a single, large step (second realignment step is allowed). (1) Moderate: More than one step used to recover equilibrium. X(0) Severe: No  step, OR would fall if not caught, OR falls spontaneously. 5. COMPENSATORY STEPPING CORRECTION- BACKWARD Instruction: "Stand with your feet shoulder width apart, arms at your sides. Lean backward against my hands beyond your backward limits. When I let go, do whatever is necessary, including taking a step, to avoid a fall." (2) Normal: Recovers independently with a single, large step. (1) Moderate: More than one step used to recover equilibrium. X(0) Severe: No step, OR would fall if not caught, OR falls spontaneously. 6. COMPENSATORY STEPPING CORRECTION- LATERAL Instruction: "Stand with your feet together, arms down at your sides. Lean into my hand beyond your sideways limit. When I let go, do whatever is necessary, including taking a step, to avoid a fall." Left X(2) Normal: Recovers independently with 1 step (crossover or lateral OK). (1) Moderate: Several steps to recover equilibrium. (0) Severe: Falls, or cannot step. Right X(2) Normal: Recovers independently with 1 step (crossover or lateral OK). (1) Moderate: Several steps to recover equilibrium. (0) Severe: Falls, or cannot step. Use the side with the lowest score to calculate sub-score and total score. ____________________________________________________________________________________Sensory Orientation_____________Subscore:_____2____/6 7. STANCE (FEET TOGETHER); EYES OPEN, FIRM SURFACE Instruction: "Place your hands on your hips. Place your feet together until almost touching. Look straight ahead. Be as stable and still as possible, until I say stop." Time in seconds:________ X(2) Normal: 30 s. (1) Moderate: < 30 s. (0) Severe: Unable. 8. STANCE (FEET TOGETHER); EYES CLOSED, FOAM SURFACE Instruction: "Step onto the foam. Place your hands on your hips. Place your feet together until almost touching. Be as stable and still as possible, until I say stop. I will start timing when you close your eyes." Time in  seconds:________ (2) Normal: 30 s. (1) Moderate: < 30 s. X(0) Severe: Unable. 9. INCLINE- EYES CLOSED Instruction: "Step onto the incline ramp. Please stand on the incline ramp with your toes toward the top. Place your feet shoulder width apart and have your arms down at your sides. I will start timing  when you close your eyes." Time in seconds:___18.31_____ (2) Normal: Stands independently 30 s and aligns with gravity. X(1) Moderate: Stands independently <30 s OR aligns with surface. (0) Severe: Unable. _________________________________________________________________________________________Dynamic Gait ______Subscore______5__/10 10. CHANGE IN GAIT SPEED Instruction: "Begin walking at your normal speed, when I tell you 'fast', walk as fast as you can. When I say 'slow', walk very slowly." (2) Normal: Significantly changes walking speed without imbalance. X(1) Moderate: Unable to change walking speed or signs of imbalance. (0) Severe: Unable to achieve significant change in walking speed AND signs of imbalance. 11. WALK WITH HEAD TURNS - HORIZONTAL Instruction: "Begin walking at your normal speed, when I say "right", turn your head and look to the right. When I say "left" turn your head and look to the left. Try to keep yourself walking in a straight line." (2) Normal: performs head turns with no change in gait speed and good balance. X(1) Moderate: performs head turns with reduction in gait speed. (0) Severe: performs head turns with imbalance. 12. WALK WITH PIVOT TURNS Instruction: "Begin walking at your normal speed. When I tell you to 'turn and stop', turn as quickly as you can, face the opposite direction, and stop. After the turn, your feet should be close together." (2) Normal: Turns with feet close FAST (< 3 steps) with good balance. X(1) Moderate: Turns with feet close SLOW (>4 steps) with good balance. (0) Severe: Cannot turn with feet close at any speed without imbalance. 13.  STEP OVER OBSTACLES Instruction: "Begin walking at your normal speed. When you get to the box, step over it, not around it and keep walking." (2) Normal: Able to step over box with minimal change of gait speed and with good balance. X(1) Moderate: Steps over box but touches box OR displays cautious behavior by slowing gait. (0) Severe: Unable to step over box OR steps around box. 14. TIMED UP & GO WITH DUAL TASK [3 METER WALK] Instruction TUG: "When I say 'Go', stand up from chair, walk at your normal speed across the tape on the floor, turn around, and come back to sit in the chair." Instruction TUG with Dual Task: "Count backwards by threes starting at ___. When I say 'Go', stand up from chair, walk at your normal speed across the tape on the floor, turn around, and come back to sit in the chair. Continue counting backwards the entire time." TUG: ____12____seconds; Dual Task TUG: ___17.59_____seconds (2) Normal: No noticeable change in sitting, standing or walking while backward counting when compared to TUG without Dual Task. X(1) Moderate: Dual Task affects either counting OR walking (>10%) when compared to the TUG without Dual Task. (0) Severe: Stops counting while walking OR stops walking while counting. When scoring item 14, if subject's gait speed slows more than 10% between the TUG without and with a Dual Task the score should be decreased by a point. TOTAL SCORE: ___12_____/28      Objective measurements completed on examination: See above findings.                PT Short Term Goals - 09/13/17 2251      PT SHORT TERM GOAL #1   Title  Pt will perform HEP with family's supervision, for improved balance, transfers, and gait.  TARGET 10/14/17    Time  4    Period  Weeks    Status  New    Target Date  10/14/17      PT SHORT TERM GOAL #2  Title  Pt will improve TUG cognitive score to less or equal to 15 seconds for decreased fall risk.    Time  4    Period   Weeks    Status  New    Target Date  10/14/17      PT SHORT TERM GOAL #3   Title  Pt will improve MiniBESTest score to at least 16/28 for decreased fall risk.    Time  4    Period  Weeks    Status  New    Target Date  10/14/17      PT SHORT TERM GOAL #4   Title  Pt will improve walking speed in posterior direction to at least 1 ft/sec for improved balance recovery in posterior direction.    Time  4    Period  Weeks    Status  New    Target Date  10/14/17      PT SHORT TERM GOAL #5   Title  Pt will perform floor>stand transfer with UE support with moderate assistance for improved fall recovery.    Time  4    Period  Weeks    Status  New    Target Date  10/14/17        PT Long Term Goals - 09/13/17 2302      PT LONG TERM GOAL #1   Title  Pt/wife will verbalize understanding of fall prevention in home environment.  TARGET 11/11/17    Time  8    Period  Weeks    Status  New    Target Date  11/11/17      PT LONG TERM GOAL #2   Title  Pt will improve MiniBESTest score to at least 20/28 for decreased fall risk.    Time  8    Period  Weeks    Status  New    Target Date  11/11/17      PT LONG TERM GOAL #3   Title  Pt will perform at least 8 of 10 reps of sit<>stand transfers from 18" surface, no posterior lean with minimal to no UE support.    Time  8    Period  Weeks    Status  New    Target Date  11/11/17      PT LONG TERM GOAL #4   Title  Pt will ambulate household distances, 100-200 ft, using rollator walker modified independently, negotiating furniture and narrow spaces, for improved independence with gait in the home.    Time  8    Period  Weeks    Status  New    Target Date  11/11/17             Plan - 09/13/17 2246    Clinical Impression Statement  Pt is an 84 year old male who presents to OP PT with history of Parkinson's disease, with at least 4-5 falls in the past 6 months.  He presents with decreased balance, abnormal posture, postural instability,  decreased timing and coordination of gait.  Pt is at fall risk per TUG cognitive and MiniBESTest scores.  When he has fallen at home, he has been unable to get up without EMS assist most of the time.  Pt would benefit from skilled PT to address the above stated deficits, to improve functional mobility and to decrease fall risk.    History and Personal Factors relevant to plan of care:  4-5 falls past 6 months, at fall risk per TUG  cog and MiniBESTest scores, >3 co-morbidities    Clinical Presentation  Evolving    Clinical Presentation due to:  PD as progressive disease, recent history of falls    Clinical Decision Making  Moderate    Rehab Potential  Good    Clinical Impairments Affecting Rehab Potential  cognition; good family support    PT Frequency  2x / week    PT Duration  8 weeks plus eval    PT Treatment/Interventions  ADLs/Self Care Home Management;DME Instruction;Balance training;Therapeutic exercise;Therapeutic activities;Functional mobility training;Gait training;Neuromuscular re-education;Patient/family education    PT Next Visit Plan  Initiate HEP to address balance strategies, sit<>stand trasnfers; work on SLS, turns, compliant surfaces; gait training with rollator    Consulted and Agree with Plan of Care  Patient;Family member/caregiver    Family Member Consulted  wife, [name]       Patient will benefit from skilled therapeutic intervention in order to improve the following deficits and impairments:  Abnormal gait, Decreased balance, Decreased mobility, Difficulty walking, Impaired flexibility, Postural dysfunction  Visit Diagnosis: Other abnormalities of gait and mobility  Unsteadiness on feet  Abnormal posture     Problem List Patient Active Problem List   Diagnosis Date Noted  . Hypersomnia, periodic 02/18/2014  . 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 H/O viral encephalitis 02/18/2014  . Hearing loss of both  ears   . Orthostatic hypotension 05/27/2011    MARRIOTT,AMY W. 09/13/2017, 11:07 PM  Gean Maidens., PT   [facility] [address] [place], [place], [address] Phone: [phone]   Fax:  [phone]  Name: OZIAH VITANZA MRN: [id number] Date of Birth: 08-Nov-1931

## 2017-09-13 NOTE — Therapy (Signed)
[facility] [address] [place], [place], [address] Phone: [phone]   Fax:  [phone]  Speech Language Pathology Evaluation  Patient Details  Name: Randy Murphy MRN: [id number] Date of Birth: 06/25/1932 Referring Provider: Dr. Lurena Joinerebecca Tat   Encounter Date: 09/13/2017  End of Session - 09/13/17 1315    Visit Number  1    Number of Visits  17    Date for SLP Re-Evaluation  11/11/17    SLP Start Time  1230    SLP Stop Time   1312    SLP Time Calculation (min)  42 min    Activity Tolerance  Patient tolerated treatment well       Past Medical History:  Diagnosis Date  . Fatigue   . Hearing loss of both ears   . History of orthostatic hypotension   . Hyperlipidemia   . Hypersomnia, periodic 02/18/2014  . Hypertension   . Insomnia   . Osteopenia   . Parkinson's disease    Possible early parkinsons disease    Past Surgical History:  Procedure Laterality Date  . CATARACT EXTRACTION, BILATERAL    . NASAL SEPTUM SURGERY  1972    There were no vitals filed for this visit.  Subjective Assessment - 09/13/17 1237    Patient is accompained by:  Family member spouse, Randy JonesCarolyn    Currently in Pain?  No/denies         [facility] - 09/13/17 1237      SLP Visit Information   SLP Received On  09/13/17    Referring Provider  Dr. Lurena Joinerebecca Tat    Onset Date  2011    Medical Diagnosis  Parkinson's Disease      Subjective   Subjective  "He fell on the other side of the house and I couldn't hear him yelling for help"    Patient/Family Stated Goal  I'd like to be able to hear him and understand him - volume and clarityu"      General Information   HPI  Records from initial diagnosis are not available.  Patient reports that he was diagnosed in approximately 2011.  His first symptom was falls.  He is currently on carbidopa/levodopa 25/100, 2 tablets four times per day (8am/noon/5pm/3am).  He automatically  wakes up at 3am and then takes his medication.  He isn't sure that medication makes a difference (appears that did on/off test and proved efficacy of car[facility]opa/[facility]opa at [facility]) HOH Bilateral BTE hearing aids Spouse reports 3 "mini" strokes. Per MD notes, spouse has been assisting pt with med management and she has been managing finances for a while due to  Pt with cognitive impairment   Behavioral/Cognition  [facility]    Mobility Status  fall 3 weeks ago      Balance Screen   Has the patient fallen in the past 6 months  Yes    How many times?  6    Has the patient had a decrease in activity level because of a fear of falling?   Yes    Is the patient reluctant to leave their home because of a fear of falling?   Yes      Prior Functional Status   Cognitive/Linguistic Baseline  Baseline deficits    Type of Home  House     Lives With  Spouse    Available Support  Personal care attendant    Vocation  Retired  Cognition   Overall Cognitive Status  History of cognitive impairments - at baseline reduced memory; bradyphrenia;       Oral Motor/Sensory Function   Overall Oral Motor/Sensory Function  Impaired    Labial ROM  Within Functional Limits    Labial Symmetry  Within Functional Limits    Labial Coordination  Reduced    Lingual ROM  Within Functional Limits    Lingual Symmetry  -- deviates right upon protrusion    Lingual Coordination  Reduced    Velum  Impaired right    Mandible  Within Functional Limits      Motor Speech   Overall Motor Speech  Impaired    Respiration  Impaired    Level of Impairment  Phrase    Phonation  Low vocal intensity    Articulation  Impaired    Level of Impairment  Sentence    Intelligibility  Intelligibility reduced    Word  75-100% accurate    Phrase  75-100% accurate    Sentence  75-100% accurate    Conversation  50-74% accurate    Motor Planning  Witnin functional limits    Interfering Components  Hearing loss     Effective Techniques  Increased vocal intensity;Slow rate;Over-articulate                      SLP Education - 09/13/17 1314    Education provided  Yes    Education Details  Loud AH!, Loudn Hey, breath support for volume, environmental compensations to improve intellgibility    Person(s) Educated  Patient;Spouse    Methods  Explanation;Demonstration;Verbal cues;Handout    Comprehension  Verbalized understanding;Returned demonstration;Verbal cues required       SLP Short Term Goals - 09/13/17 1523      SLP SHORT TERM GOAL #1   Title  Pt will average 85dB on loud /a/ and "hey" with occasional min A over 3 sessions    Time  4    Period  Weeks    Status  New      SLP SHORT TERM GOAL #2   Title  Pt will average 68dB in sentence level structured speech tasksk with occasional min A over 2 sessions    Time  4    Period  Weeks    Status  New      SLP SHORT TERM GOAL #3   Title  Pt will average 68dB in simple conversation over 5 minutes with occasional min A over 2 sessions    Time  4    Period  Weeks    Status  New       SLP Long Term Goals - 09/13/17 1525      SLP LONG TERM GOAL #1   Title  Pt will average 85dB on loud /a/ or "hey" over 6 sessions with rare min A    Time  8    Period  Weeks    Status  New      SLP LONG TERM GOAL #2   Title  Pt will average 68dB over 15  minute simple conversation with occasional min A over 2 sessions    Time  8    Period  Weeks    Status  New      SLP LONG TERM GOAL #3   Title  Pt will be audible/intellgible in mildly noisy environment duirng 10 minute simple conversation over 2 sessions, with 2 or less requests for repetition.  Plan - 09/13/17 1523    Clinical Impression Statement  Mr. Randy Murphy, an 84 y.o. male with h/o of PD dx in 2011 is referred for outpt ST due to reduced intelligilbity. He is accompanied by his wife, Randy Murphy. Per spouse, pt fell in the other end of their home and he did not generate enough volume  to call her to the room. He remained on the floor for an hour, until she found him. Today, he presents with severe hypokinetic dysarthria affecting intellgilbity at home, with his wife and family. His wife also reports reduced articulation. Pt and spouse deny any s/s of dysphagia.  Oral motor assessment revealed WFL lingual ROM and reduced lingual strength and coordination. Labial [facility] was [facility] and strength was [facility] Velar ROM appeared reduced right   Measured when a sound level meter was placed 30 cm away from pt's mouth, 8 minutes of conversational speech was reduced today, at average 55dB (WNL= average 70-72dB) with range of 53 to 58dB. Overall speech intelligibility for this listener in a quiet environment was not affected, at approx 100%. Production of loud /a/ averaged 83dB (range of 80  to 85) and usual moderate cues  needed for loudness.   Pt did not rate effort level upon request, with some confusion of what I was asking and my instructions,  for production of loud /a/. In oral reading of simple common social phrases task pt was asked to use the same amount of effort as with loud /a/. Loudness average with this increased effort was 68dBdB (range of 65 to 70) with usual mod A  for loudness. Pt would benefit from skilled ST in order to improve speech intelligibility and pt's QOL.       Patient will benefit from skilled therapeutic intervention in order to improve the following deficits and impairments:   Dysarthria and anarthria    Problem List Patient Active Problem List   Diagnosis Date Noted  . Hypersomnia, periodic 02/18/2014  . 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 H/O viral encephalitis 02/18/2014  . Hearing loss of both ears   . Orthostatic hypotension 05/27/2011    Babe Anthis, Radene Journey MS, CCC-SLP 09/13/2017, 3:29 PM  [facility] [address] [place], [place],  [address] Phone: [phone]   Fax:  [phone]  Name: Randy Murphy MRN: [id number] Date of Birth: May 24, 1932

## 2017-09-13 NOTE — Patient Instructions (Signed)
  Big Breath with Loud "AH!" 5x twice a day  Big breath before each setence  SLOW, LOUD and BIG  HEY!  HEY THERE  Come here Jean RosenthalJackson  Thank you, Marylu LundJanet  How are you, Francis DowseJoel?  Hi, Marilyn  Read 10 from the handout twice a day    Get the persons attention before you speak  Use eye contact and face the person you are speaking to  Be in close proximity to the person you are speaking to  Turn down any noise in the environment such as the TV, walk away from loud appliances, air conditioners, fans, dish washers etc

## 2017-10-07 ENCOUNTER — Emergency Department (HOSPITAL_COMMUNITY)
Admission: EM | Admit: 2017-10-07 | Discharge: 2017-10-07 | Disposition: A | Discharge status: Home / Self Care | Payer: Medicare Other | Attending: Emergency Medicine | Admitting: Emergency Medicine

## 2017-10-07 ENCOUNTER — Encounter (HOSPITAL_COMMUNITY): Payer: Self-pay

## 2017-10-07 ENCOUNTER — Ambulatory Visit: Payer: Medicare Other | Admitting: Physical Therapy

## 2017-10-07 ENCOUNTER — Ambulatory Visit: Payer: Medicare Other

## 2017-10-07 ENCOUNTER — Emergency Department (HOSPITAL_COMMUNITY): Payer: Medicare Other

## 2017-10-07 DIAGNOSIS — S61412A Laceration without foreign body of left hand, initial encounter: Secondary | ICD-10-CM

## 2017-10-07 DIAGNOSIS — Z79899 Other long term (current) drug therapy: Secondary | ICD-10-CM | POA: Diagnosis not present

## 2017-10-07 DIAGNOSIS — Y999 Unspecified external cause status: Secondary | ICD-10-CM | POA: Diagnosis not present

## 2017-10-07 DIAGNOSIS — Y929 Unspecified place or not applicable: Secondary | ICD-10-CM | POA: Diagnosis not present

## 2017-10-07 DIAGNOSIS — Z23 Encounter for immunization: Secondary | ICD-10-CM | POA: Insufficient documentation

## 2017-10-07 DIAGNOSIS — Y9301 Activity, walking, marching and hiking: Secondary | ICD-10-CM | POA: Diagnosis not present

## 2017-10-07 DIAGNOSIS — I10 Essential (primary) hypertension: Secondary | ICD-10-CM | POA: Insufficient documentation

## 2017-10-07 DIAGNOSIS — S0990XA Unspecified injury of head, initial encounter: Secondary | ICD-10-CM | POA: Diagnosis present

## 2017-10-07 DIAGNOSIS — S0083XA Contusion of other part of head, initial encounter: Secondary | ICD-10-CM | POA: Diagnosis not present

## 2017-10-07 DIAGNOSIS — W19XXXA Unspecified fall, initial encounter: Secondary | ICD-10-CM

## 2017-10-07 DIAGNOSIS — W010XXA Fall on same level from slipping, tripping and stumbling without subsequent striking against object, initial encounter: Secondary | ICD-10-CM | POA: Insufficient documentation

## 2017-10-07 DIAGNOSIS — G20 Parkinson's disease: Secondary | ICD-10-CM | POA: Insufficient documentation

## 2017-10-07 DIAGNOSIS — Z7982 Long term (current) use of aspirin: Secondary | ICD-10-CM | POA: Insufficient documentation

## 2017-10-07 MED ORDER — TETANUS-DIPHTH-ACELL PERTUSSIS 5-2.5-18.5 LF-MCG/0.5 IM SUSP
0.5000 mL | Freq: Once | INTRAMUSCULAR | Status: AC
  Administered 2017-10-07: 0.5 mL via INTRAMUSCULAR
  Filled 2017-10-07: qty 0.5

## 2017-10-07 MED ORDER — LIDOCAINE-EPINEPHRINE (PF) 2 %-1:200000 IJ SOLN
10.0000 mL | Freq: Once | INTRAMUSCULAR | Status: AC
  Administered 2017-10-07: 10 mL via INTRADERMAL
  Filled 2017-10-07: qty 20

## 2017-10-07 NOTE — ED Provider Notes (Signed)
[facility] EMERGENCY DEPARTMENT Provider Note   CSN: [id number] Arrival date & time: 10/07/17  1041     History   Chief Complaint Chief Complaint  Patient presents with  . Fall    HPI Randy Murphy is a 84 y.o. male.  HPI  84 year old male with a history of Parkinson's presents after a fall and head injury.  He states he was walking too fast and fell forward and hit his head.  He did not lose consciousness.  He never felt dizzy or lightheaded beforehand.  No chest pain or shortness of breath.  He denies a headache.  X-ray was obtained of his thumb but he states that there is some swelling but no pain.  He also has a small laceration to the left hand.  He is on aspirin but no other blood thinners.  He denies any weakness or numbness.  Past Medical History:  Diagnosis Date  . Fatigue   . Hearing loss of both ears   . History of orthostatic hypotension   . Hyperlipidemia   . Hypersomnia, periodic 02/18/2014  . Hypertension   . Insomnia   . Osteopenia   . Parkinson's disease    Possible early parkinsons disease    Patient Active Problem List   Diagnosis Date Noted  . Hypersomnia, periodic 02/18/2014  . 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 H/O viral encephalitis 02/18/2014  . Hearing loss of both ears   . Orthostatic hypotension 05/27/2011    Past Surgical History:  Procedure Laterality Date  . CATARACT EXTRACTION, BILATERAL    . NASAL SEPTUM SURGERY  1972       Home Medications    Prior to Admission medications   Medication Sig Start Date End Date Taking? Authorizing Provider  aspirin 81 MG tablet Take 81 mg by mouth daily.      [provider]  carbidopa-levodopa (SINEMET) 25-100 MG per tablet Take 2.5 tablets by mouth 4 (four) times daily. 3 am/ 8 am/ 12 pm/ 5 pm    [provider]  Cholecalciferol (VITAMIN D PO) Take by mouth.      [provider]    clonazePAM (KLONOPIN) 0.5 MG tablet Take 1 tablet (0.5 mg total) by mouth at bedtime. 09/02/17   Tat, Octaviano Batty, DO  Coenzyme Q10 (CO Q 10 PO) Take by mouth daily.      [provider]  donepezil (ARICEPT) 10 MG tablet Take 1 tablet (10 mg total) by mouth at bedtime. 08/29/17   Tat, Octaviano Batty, DO  finasteride (PROSCAR) 5 MG tablet Take 5 mg by mouth daily.      [provider]  Flaxseed, Linseed, (FLAX SEED OIL PO) Take by mouth 2 (two) times daily.      [provider]  losartan (COZAAR) 25 MG tablet Take 25 mg by mouth daily. 1/2 tablet every evening    [provider]  MAGNESIUM PO Take by mouth daily.      [provider]  Melatonin 5 MG TABS Take 5 mg by mouth daily.    [provider]  Multiple Vitamins-Minerals (ICAPS AREDS FORMULA PO) Take 1 capsule by mouth 2 (two) times daily.    [provider]  Multiple Vitamins-Minerals (MULTIVITAMIN PO) Take 1 tablet by mouth daily.    [provider]  Probiotic Product (ALIGN PO) Take 1 tablet by mouth daily.    [provider]  TURMERIC PO Take by mouth.  [provider]    Family History Family History  Problem Relation Age of Onset  . Stroke Sister     Social History Social History   Tobacco Use  . Smoking status: Never Smoker  . Smokeless tobacco: Never Used  Substance Use Topics  . Alcohol use: No  . Drug use: No     Allergies   Patient has no known allergies.   Review of Systems Review of Systems  Respiratory: Negative for shortness of breath.   Cardiovascular: Negative for chest pain.  Gastrointestinal: Negative for vomiting.  Musculoskeletal: Positive for arthralgias and joint swelling. Negative for neck pain.  Skin: Positive for wound.  Neurological: Positive for headaches. Negative for weakness and numbness.  All other systems reviewed and are negative.    Physical Exam Updated Vital Signs BP (!) 203/96 (BP Location:  Right Arm)   Pulse 85   Temp (!) 97.4 F (36.3 C) (Oral)   Resp 18   Ht 5\' 11"  (1.803 m)   Wt 82.6 kg (182 lb)   SpO2 99%   BMI 25.38 kg/m   Physical Exam  Constitutional: He is oriented to person, place, and time. He appears well-developed and well-nourished. No distress.  HENT:  Head: Normocephalic. Head is with contusion.    Right Ear: External ear normal.  Left Ear: External ear normal.  Nose: Nose normal.  Eyes: Right eye exhibits no discharge. Left eye exhibits no discharge.  Neck: Neck supple. No spinous process tenderness and no muscular tenderness present.  Cardiovascular: Normal rate, regular rhythm and normal heart sounds.  Pulmonary/Chest: Effort normal and breath sounds normal.  Abdominal: Soft. There is no tenderness.  Musculoskeletal: He exhibits no edema.       Right hand: He exhibits swelling. He exhibits no tenderness.       Left hand: He exhibits laceration. He exhibits no tenderness.       Hands: Neurological: He is alert and oriented to person, place, and time.  Skin: Skin is warm and dry. He is not diaphoretic.  Nursing note and vitals reviewed.    ED Treatments / Results  Labs (all labs ordered are listed, but only abnormal results are displayed) Labs Reviewed - No data to display  EKG  EKG Interpretation None       Radiology Ct Head Wo Contrast  Result Date: 10/07/2017 CLINICAL DATA:  84 year old and fall this morning. Swelling in the anterior forehead. EXAM: CT HEAD WITHOUT CONTRAST TECHNIQUE: Contiguous axial images were obtained from the base of the skull through the vertex without intravenous contrast. COMPARISON:  Brain MR 03/28/2014 FINDINGS: Brain: Mild atrophy. Extensive low-density in the periventricular and subcortical white matter suggesting chronic changes. No evidence for acute hemorrhage, mass lesion, midline shift, hydrocephalus or large infarct. Vascular: Vertebral artery atherosclerotic calcifications. Skull: No calvarial  fracture. Sinuses/Orbits: Mucosal thickening in anterior ethmoid air cells. Evidence for previous right maxillary sinus surgery and suspect chronic disease in the right maxillary sinus. Other: Focal soft tissue swelling along the anterior forehead. No underlying fracture. IMPRESSION: No acute intracranial abnormality. Scattered white matter disease is suggestive for chronic small vessel ischemic changes. Forehead scalp hematoma.  No underlying fracture. Electronically Signed   By: Richarda OverlieAdam  Henn M.D.   On: 10/07/2017 13:46   Dg Hand Complete Right  Result Date: 10/07/2017 CLINICAL DATA:  Right thumb pain after fall today. EXAM: RIGHT HAND - COMPLETE 3+ VIEW COMPARISON:  None. FINDINGS: There is no evidence of fracture or dislocation. Severe narrowing and  osteophyte formation is seen involving the first carpometacarpal joint. Soft tissues are unremarkable. 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 No acute abnormality seen in the right hand. Electronically Signed   By: Lupita Raider, M.D.   On: 10/07/2017 12:41    Procedures .Marland KitchenLaceration Repair Date/Time: 10/07/2017 5:15 PM Performed by: Pricilla Loveless, MD Authorized by: Pricilla Loveless, MD   Consent:    Consent obtained:  Verbal   Consent given by:  Patient Anesthesia (see MAR for exact dosages):    Anesthesia method:  Local infiltration   Local anesthetic:  Lidocaine 2% WITH epi Laceration details:    Location:  Hand   Hand location:  L hand, dorsum   Length (cm):  3 Repair type:    Repair type:  Simple Pre-procedure details:    Preparation:  Patient was prepped and draped in usual sterile fashion Exploration:    Wound exploration: wound explored through full range of motion and entire depth of wound probed and visualized   Treatment:    Area cleansed with:  Saline   Amount of cleaning:  Standard   Irrigation solution:  Sterile saline   Irrigation method:  Syringe Skin repair:    Repair method:  Sutures    Suture size:  4-0   Suture material:  Nylon   Number of sutures:  4 Approximation:    Approximation:  Close Post-procedure details:    Patient tolerance of procedure:  Tolerated well, no immediate complications   (including critical care time)  Medications Ordered in ED Medications  Tdap (BOOSTRIX) injection 0.5 mL (not administered)  lidocaine-EPINEPHrine (XYLOCAINE W/EPI) 2 %-1:200000 (PF) injection 10 mL (10 mLs Intradermal Given 10/07/17 1609)     Initial Impression / Assessment and Plan / ED Course  I have reviewed the triage vital signs and the nursing notes.  Pertinent labs & imaging results that were available during my care of the patient were reviewed by me and considered in my medical decision making (see chart for details).     Patient presents with mechanical fall.  Has an impressive forehead contusion but CT head does not show any intracranial injury.  He is acting at baseline and his neuro exam is benign.  He is ambulating with a walker at baseline.  He does have some thumb swelling but this x-ray is negative for acute process.  Small V-shaped laceration was repaired as above.  He was updated on his tetanus because he is not sure of when his last immunization was.  Follow-up with PCP for suture removal.  Discussed return precautions.  Final Clinical Impressions(s) / ED Diagnoses   Final diagnoses:  Fall, initial encounter  Contusion of forehead, initial encounter  Laceration of left hand without foreign body, initial encounter    ED Discharge Orders    None       Pricilla Loveless, MD 10/07/17 [phone]

## 2017-10-07 NOTE — ED Notes (Signed)
Suture cart at bedside 

## 2017-10-07 NOTE — ED Triage Notes (Signed)
Per Pt, Pt is coming from home with a mechanical fall this morning that happened when he got tripped up. Pt reports hitting his head and two hands. Has some swelling noted on the anterior of the head, swelling to the right thumb, and an abrasion to the left hand. Alert and oriented x4. Denies any LOC. Hx of ASA, but no other blood thinner.

## 2017-10-07 NOTE — ED Notes (Signed)
Pt verbalized understanding of suture care and to follow up with pcp in 7 days for suture removal. NO further questions. VSS.

## 2017-10-10 ENCOUNTER — Emergency Department (HOSPITAL_COMMUNITY)
Admission: EM | Admit: 2017-10-10 | Discharge: 2017-10-10 | Disposition: A | Discharge status: Home / Self Care | Payer: Medicare Other | Attending: Emergency Medicine | Admitting: Emergency Medicine

## 2017-10-10 ENCOUNTER — Emergency Department (HOSPITAL_COMMUNITY): Payer: Medicare Other

## 2017-10-10 ENCOUNTER — Encounter (HOSPITAL_COMMUNITY): Payer: Self-pay

## 2017-10-10 DIAGNOSIS — I10 Essential (primary) hypertension: Secondary | ICD-10-CM | POA: Insufficient documentation

## 2017-10-10 DIAGNOSIS — Z79899 Other long term (current) drug therapy: Secondary | ICD-10-CM | POA: Diagnosis not present

## 2017-10-10 DIAGNOSIS — W010XXA Fall on same level from slipping, tripping and stumbling without subsequent striking against object, initial encounter: Secondary | ICD-10-CM | POA: Insufficient documentation

## 2017-10-10 DIAGNOSIS — Y929 Unspecified place or not applicable: Secondary | ICD-10-CM | POA: Insufficient documentation

## 2017-10-10 DIAGNOSIS — Y998 Other external cause status: Secondary | ICD-10-CM | POA: Diagnosis not present

## 2017-10-10 DIAGNOSIS — W19XXXA Unspecified fall, initial encounter: Secondary | ICD-10-CM

## 2017-10-10 DIAGNOSIS — Y9301 Activity, walking, marching and hiking: Secondary | ICD-10-CM | POA: Diagnosis not present

## 2017-10-10 DIAGNOSIS — G20 Parkinson's disease: Secondary | ICD-10-CM | POA: Diagnosis not present

## 2017-10-10 DIAGNOSIS — S0093XA Contusion of unspecified part of head, initial encounter: Secondary | ICD-10-CM | POA: Insufficient documentation

## 2017-10-10 DIAGNOSIS — Z7982 Long term (current) use of aspirin: Secondary | ICD-10-CM | POA: Diagnosis not present

## 2017-10-10 DIAGNOSIS — S0990XA Unspecified injury of head, initial encounter: Secondary | ICD-10-CM | POA: Diagnosis present

## 2017-10-10 LAB — COMPREHENSIVE METABOLIC PANEL
ALT: 6 U/L — ABNORMAL LOW (ref 17–63)
AST: 26 U/L (ref 15–41)
Albumin: 3.9 g/dL (ref 3.5–5.0)
Alkaline Phosphatase: 63 U/L (ref 38–126)
Anion gap: 10 (ref 5–15)
BUN: 25 mg/dL — ABNORMAL HIGH (ref 6–20)
CO2: 26 mmol/L (ref 22–32)
Calcium: 8.9 mg/dL (ref 8.9–10.3)
Chloride: 103 mmol/L (ref 101–111)
Creatinine, Ser: 1.22 mg/dL (ref 0.61–1.24)
GFR calc Af Amer: >60 mL/min (ref >60)
GFR calc non Af Amer: 53 mL/min — ABNORMAL LOW (ref >60)
Glucose, Bld: 126 mg/dL — ABNORMAL HIGH (ref 65–99)
Potassium: 4.1 mmol/L (ref 3.5–5.1)
Sodium: 139 mmol/L (ref 135–145)
Total Bilirubin: 0.8 mg/dL (ref 0.3–1.2)
Total Protein: 6.6 g/dL (ref 6.5–8.1)

## 2017-10-10 LAB — CBC
HCT: 43.1 % (ref 39.0–52.0)
Hemoglobin: 13.6 g/dL (ref 13.0–17.0)
MCH: 31.2 pg (ref 26.0–34.0)
MCHC: 31.6 g/dL (ref 30.0–36.0)
MCV: 98.9 fL (ref 78.0–100.0)
Platelets: 182 10*3/uL (ref 150–400)
RBC: 4.36 MIL/uL (ref 4.22–5.81)
RDW: 12.8 % (ref 11.5–15.5)
WBC: 6.9 10*3/uL (ref 4.0–10.5)

## 2017-10-10 NOTE — ED Notes (Signed)
Pt wife would like to come back with pt to help explain what happened to pt. She also wanted to know if she could be treated in same room with pt.

## 2017-10-10 NOTE — ED Notes (Signed)
ED Provider at bedside. 

## 2017-10-10 NOTE — ED Provider Notes (Signed)
[facility] Provider Note   CSN: [id number] Arrival date & time: 10/10/17  1312     History   Chief Complaint Chief Complaint  Patient presents with  . Fall    HPI Randy Murphy is a 84 y.o. male.  HPI 84 y/o male hx of PD/chronic falls presents to ED. S/p fall yesterday after misteppiing with walker.  Pt denies LOC. ASA daily. Denies additional new injuries.  Past Medical History:  Diagnosis Date  . Fatigue   . Hearing loss of both ears   . History of orthostatic hypotension   . Hyperlipidemia   . Hypersomnia, periodic 02/18/2014  . Hypertension   . Insomnia   . Osteopenia   . Parkinson's disease    Possible early parkinsons disease    Patient Active Problem List   Diagnosis Date Noted  . Hypersomnia, periodic 02/18/2014  . 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 H/O viral encephalitis 02/18/2014  . Hearing loss of both ears   . Orthostatic hypotension 05/27/2011    Past Surgical History:  Procedure Laterality Date  . CATARACT EXTRACTION, BILATERAL    . NASAL SEPTUM SURGERY  1972       Home Medications    Prior to Admission medications   Medication Sig Start Date End Date Taking? Authorizing Provider  acetaminophen (TYLENOL) 500 MG tablet Take 1,000 mg by mouth every 6 (six) hours as needed for mild pain.   Yes [provider]  aspirin 81 MG tablet Take 81 mg by mouth daily.     Yes [provider]  carbidopa-levodopa (SINEMET) 25-100 MG per tablet Take 2.5 tablets by mouth 4 (four) times daily. 3 am/ 8 am/ 12 pm/ 5 pm   Yes [provider]  Cholecalciferol (VITAMIN D PO) Take by mouth.     Yes [provider]  clonazePAM (KLONOPIN) 0.5 MG tablet Take 1 tablet (0.5 mg total) by mouth at bedtime. 09/02/17  Yes Tat, Octaviano Battyebecca S, DO  Coenzyme Q10 (CO Q 10 PO) Take by mouth daily.     Yes [provider]  donepezil (ARICEPT)  10 MG tablet Take 1 tablet (10 mg total) by mouth at bedtime. 08/29/17  Yes Tat, Octaviano Battyebecca S, DO  finasteride (PROSCAR) 5 MG tablet Take 5 mg by mouth daily.     Yes [provider]  Flaxseed, Linseed, (FLAX SEED OIL PO) Take by mouth 2 (two) times daily.     Yes [provider]  losartan (COZAAR) 25 MG tablet Take 25 mg by mouth daily. 1/2 tablet every evening   Yes [provider]  MAGNESIUM PO Take by mouth daily.     Yes [provider]  Melatonin 5 MG TABS Take 5 mg by mouth daily.   Yes [provider]  Multiple Vitamins-Minerals (ICAPS AREDS FORMULA PO) Take 1 capsule by mouth 2 (two) times daily.   Yes [provider]  Multiple Vitamins-Minerals (MULTIVITAMIN PO) Take 1 tablet by mouth daily.   Yes [provider]  Probiotic Product (ALIGN PO) Take 1 tablet by mouth daily.   Yes [provider]  TURMERIC PO Take by mouth.   Yes [provider]    Family History Family History  Problem Relation Age of Onset  . Stroke Sister     Social History Social History   Tobacco Use  . Smoking status: Never Smoker  . Smokeless tobacco: Never Used  Substance Use Topics  .  Alcohol use: No  . Drug use: No     Allergies   Patient has no known allergies.   Review of Systems Review of Systems  Constitutional: Negative for chills and fever.  HENT: Negative for ear pain and sore throat.   Eyes: Negative for pain and visual disturbance.  Respiratory: Negative for cough and shortness of breath.   Cardiovascular: Negative for chest pain and palpitations.  Gastrointestinal: Negative for abdominal pain and vomiting.  Genitourinary: Negative for dysuria and hematuria.  Musculoskeletal: Negative for arthralgias and back pain.  Skin: Positive for color change (contusion for forehead). Negative for rash.  Neurological: Negative for seizures and syncope.  All other systems reviewed and are negative.       Physical Exam Updated Vital Signs BP (!) 182/92   Pulse 90   Temp 98.1 F (36.7 C) (Oral)   Resp 16   Ht 5\' 11"  (1.803 m)   Wt 82.6 kg (182 lb)   SpO2 96%   BMI 25.38 kg/m   Physical Exam  Constitutional: Randy Murphy appears well-developed and well-nourished.  HENT:  Head: Normocephalic and atraumatic.  Eyes: Conjunctivae are normal.  Neck: Neck supple.  Cardiovascular: Normal rate and regular rhythm.  No murmur heard. Pulmonary/Chest: Effort normal and breath sounds normal. No respiratory distress.  Abdominal: Soft. There is no tenderness.  Musculoskeletal: Randy Murphy exhibits no edema.  Neurological: Randy Murphy is alert. Randy Murphy displays no tremor. No cranial nerve deficit or sensory deficit. Randy Murphy exhibits normal muscle tone. GCS eye subscore is 4. GCS verbal subscore is 5. GCS motor subscore is 6.  Skin: Skin is warm and dry. Ecchymosis (periorbital; LUE; forehead) noted.  Psychiatric: Randy Murphy has a normal mood and affect.  Nursing note and vitals reviewed.    ED Treatments / Results  Labs (all labs ordered are listed, but only abnormal results are displayed) Labs Reviewed  COMPREHENSIVE METABOLIC PANEL - Abnormal; Notable for the following components:      Result Value   Glucose, Bld 126 (*)    BUN 25 (*)    ALT 6 (*)    GFR calc non Af Amer 53 (*)    All other components within normal limits  CBC  CBG MONITORING, ED    EKG  EKG Interpretation None       Radiology Ct Head Wo Contrast  Result Date: 10/10/2017 CLINICAL DATA:  84 year old male with frequent falls. Subsequent encounter. EXAM: CT HEAD WITHOUT CONTRAST TECHNIQUE: Contiguous axial images were obtained from the base of the skull through the vertex without intravenous contrast. COMPARISON:  10/07/2017. FINDINGS: Brain: No intracranial hemorrhage or CT evidence of large acute infarct. Chronic microvascular changes. Atrophy. No intracranial mass lesion noted on this unenhanced exam. Vascular: Vascular calcifications. Skull: No  skull fracture. Sinuses/Orbits: Mucosal thickening maxillary sinuses greater on right with appearance of sinusitis rather than orbital floor fracture. Other: Decrease size frontal subcutaneous hematoma. Degenerative changes upper cervical spine. IMPRESSION: Decrease size of right frontal subcutaneous hematoma. No skull fracture or intracranial hemorrhage. Chronic microvascular changes. Global atrophy. Maxillary sinus mucosal thickening greater on right suggestive of sinusitis rather than orbital floor fracture. Electronically Signed   By: Lacy Duverney M.D.   On: 10/10/2017 18:01    Procedures Procedures (including critical care time)  Medications Ordered in ED Medications - No data to display   Initial Impression / Assessment and Plan / ED Course  I have reviewed the triage vital signs and the nursing notes.  Pertinent labs & imaging results that were  available during my care of the patient were reviewed by me and considered in my medical decision making (see chart for details).    84 y/o male hx of PD/chronic falls presents to ED. S/p fall yesterday after misteppiing with walker.  Pt denies LOC. ASA daily. Denies additional new injuries.  Review of labs unremarkable.  Neg focal deficits on physical exam.  Multiple contusions various stages healing. Review of labs unremarkable concern for anemia/hypoglycemia/infection.. CT head without findings of CVA/ICH.  Pt baseline cognitive/functional without complaints.    Final Clinical Impressions(s) / ED Diagnoses   Final diagnoses:  Fall, initial encounter  Contusion of head, unspecified part of head, initial encounter    ED Discharge Orders    None       Jaynie Collins, DO 10/10/17 1932    Cathren Laine, MD 10/11/17 [phone]

## 2017-10-10 NOTE — ED Triage Notes (Signed)
Per pT, Pt is coming from home to be re-evaluated after frequent falls. Pt has racoon eyes noted from last fall. Pt fell after he left the ED onto a ceramic floor.  Denies any nausea, vomiting, CP, SOB, headache, blurred vision. Pt reports staying awake all night. Alert and oriented x 4 upon arrival.   Wife reports that she was not able to wake him up easily this morning. Pt finally woke up around 1100. Reports some confusion, and quick to fall asleep. BP at home 77/52 at 1130, 81/51, 98/53 at 1200. Pt was given V8 at home to increase BP.

## 2017-10-11 ENCOUNTER — Encounter: Payer: Medicare Other | Admitting: Speech Pathology

## 2017-10-11 ENCOUNTER — Ambulatory Visit: Payer: Medicare Other | Admitting: Physical Therapy

## 2017-10-13 ENCOUNTER — Encounter: Payer: Medicare Other | Admitting: Speech Pathology

## 2017-10-13 ENCOUNTER — Ambulatory Visit: Payer: Medicare Other | Admitting: Physical Therapy

## 2017-10-17 NOTE — Progress Notes (Signed)
Randy Murphy was seen today in the movement disorders clinic for neurologic consultation at the request of Dr. Zola Murphy.  His PCP is Randy Murphy, [name], MD.  This patient is accompanied in the office by his wife who supplements the history.The consultation is for the evaluation of PD.  Pt has also previously seen Dr. Vickey Murphy.  I have reviewed neurology records made available to me.  The patient last saw Dr. Zola Murphy on July 14, 2017.  Records from initial diagnosis are not available.  Patient reports that he was diagnosed in approximately 2011.  His first symptom was falls.  He is currently on carbidopa/levodopa 25/100, 2 tablets four times per day (8am/noon/5pm/3am).  He automatically wakes up at 3am and then takes his medication.  He isn't sure that medication makes a difference (appears that did on/off test and proved efficacy of carbidopa/levodopa at [facility])  10/18/17 update: Patient is seen today in follow-up.  This patient is accompanied in the office by his wife and son who supplements the history.He is on carbidopa/levodopa 25/100, 2.5 tablets 3 times per day and then takes 2.5 tablets in the middle of the night.  Records have been reviewed since last visit.  The patient was in the emergency room on February 8 and October 10, 2017 with falls.  With the first fall, he did have to have sutures.  Reports that he was walking too fast and fell forward and hit his head.  With the second fall, he was using his walker and mis-stepped and had ecchymosis in the emergency room in his left upper extremity and periorbital region.  Reports had 6 falls in one week.  Called PCP and tried to get urine test and told inconclusive for UTI but called in abx on 2/15. He has 2 days left and is markedly better and nearly back to baseline. Had a single hallucination when he wasn't doing well but none since. Son states there has been a dramatic improvement compared to over the weekend where he described patient as "catatonic."   I  sent him for physical therapy last visit and he had an initial screen, but was not able to get into therapy until February 22.  His wife is not sure that she will be able to safely get him there.  PREVIOUS MEDICATIONS:  Catapres (for sweating, no help)  ALLERGIES:  No Known Allergies  CURRENT MEDICATIONS:  Outpatient Encounter Medications as of 10/18/2017  Medication Sig  . aspirin 81 MG tablet Take 81 mg by mouth daily.    . carbidopa-levodopa (SINEMET IR) 25-100 MG tablet Take 2.5 tablets by mouth 4 (four) times daily. 3 am/ 8 am/ 12 pm/ 5 pm  . Cholecalciferol (VITAMIN D PO) Take by mouth.    . clonazePAM (KLONOPIN) 0.5 MG tablet Take 1 tablet (0.5 mg total) by mouth at bedtime.  . Coenzyme Q10 (CO Q 10 PO) Take by mouth daily.    Marland Kitchen. donepezil (ARICEPT) 10 MG tablet Take 1 tablet (10 mg total) by mouth at bedtime.  . finasteride (PROSCAR) 5 MG tablet Take 5 mg by mouth daily.    . Flaxseed, Linseed, (FLAX SEED OIL PO) Take by mouth 2 (two) times daily.    Marland Kitchen. losartan (COZAAR) 25 MG tablet Take 25 mg by mouth daily. 1/2 tablet every evening  . MAGNESIUM PO Take by mouth daily.    . Melatonin 5 MG TABS Take 5 mg by mouth daily.  . Multiple Vitamins-Minerals (ICAPS AREDS FORMULA PO) Take 1  capsule by mouth 2 (two) times daily.  . Multiple Vitamins-Minerals (MULTIVITAMIN PO) Take 1 tablet by mouth daily.  . nitrofurantoin, macrocrystal-monohydrate, (MACROBID) 100 MG capsule TAKE 1 CAPSULE BY MOUTH TWICE A DAY X 7 DAYS  . Probiotic Product (ALIGN PO) Take 1 tablet by mouth daily.  . TURMERIC PO Take by mouth.  . [DISCONTINUED] carbidopa-levodopa (SINEMET) 25-100 MG per tablet Take 2.5 tablets by mouth 4 (four) times daily. 3 am/ 8 am/ 12 pm/ 5 pm  . AMBULATORY NON FORMULARY MEDICATION Lift Chair DX: G20  . [DISCONTINUED] acetaminophen (TYLENOL) 500 MG tablet Take 1,000 mg by mouth every 6 (six) hours as needed for mild pain.   No facility-administered encounter medications on file as of  10/18/2017.     PAST MEDICAL HISTORY:   Past Medical History:  Diagnosis Date  . Fatigue   . Hearing loss of both ears   . History of orthostatic hypotension   . Hyperlipidemia   . Hypersomnia, periodic 02/18/2014  . Hypertension   . Insomnia   . Osteopenia   . Parkinson's disease    Possible early parkinsons disease    PAST SURGICAL HISTORY:   Past Surgical History:  Procedure Laterality Date  . CATARACT EXTRACTION, BILATERAL    . NASAL SEPTUM SURGERY  1972    SOCIAL HISTORY:   Social History   Socioeconomic History  . Marital status: Married    Spouse name: Randy Murphy  . Number of children: 1  . Years of education: 21  . Highest education level: Not on file  Social Needs  . Financial resource strain: Not on file  . Food insecurity - worry: Not on file  . Food insecurity - inability: Not on file  . Transportation needs - medical: Not on file  . Transportation needs - non-medical: Not on file  Occupational History  . Occupation: retired    Comment: Dealer - Building surveyor  Tobacco Use  . Smoking status: Never Smoker  . Smokeless tobacco: Never Used  Substance and Sexual Activity  . Alcohol use: No  . Drug use: No  . Sexual activity: Not on file  Other Topics Concern  . Not on file  Social History Narrative   Patient is married Randy Murphy) and lives at home with his wife.   Patient has one adult child.   Patient is retired.   Patient has a college education.   Patient is right-handed.   Patient drinks one cup of coffee daily.    FAMILY HISTORY:   Family Status  Relation Name Status  . Mother died at 84 Deceased  . Father influenza Deceased  . Sister  Alive  . Son  Alive    ROS:  A complete 10 system review of systems was obtained and was unremarkable apart from what is mentioned above.  PHYSICAL EXAMINATION:    VITALS:   Vitals:   10/18/17 1447  BP: (!) 152/80  Pulse: 70  SpO2: 91%    GEN:  The patient appears stated age  and is in NAD. HEENT:  Normocephalic, atraumatic.  The mucous membranes are moist. The superficial temporal arteries are without ropiness or tenderness. CV:  RRR Lungs:  Has diffuse expiratory wheezes but otherwise no rales/rhonchi Neck/HEME:  There are no carotid bruits bilaterally.  Neurological examination:  Orientation: The patient is alert and oriented x3.  I did I did not repeat his Moca since it was just done at [facility] a few weeks ago.  At that point in  time his Moca was 12/30. Cranial nerves: There is good facial symmetry. Pupils are equal round and minimally reactive to light bilaterally. Fundoscopic exam is attempted but the disc margins are not well visualized bilaterally. Extraocular muscles are intact. The visual fields are full to confrontational testing. The speech is fluent and clear. He is hypophonic.  Soft palate rises symmetrically and there is no tongue deviation. Hearing is intact to conversational tone. Sensation: Sensation is intact to light and pinprick throughout (facial, trunk, extremities). Vibration is decreased at the bilateral big toe. There is no extinction with double simultaneous stimulation. There is no sensory dermatomal level identified. Motor: Strength is 5/5 in the bilateral upper and lower extremities.   Shoulder shrug is equal and symmetric.  There is no pronator drift. Deep tendon reflexes: Deep tendon reflexes are 2/4 at the bilateral biceps, triceps, brachioradialis, patella and achilles. Plantar responses are downgoing bilaterally.  Movement examination: Tone: There is normal tone in the bilateral upper extremities.  The tone in the lower extremities is normal.  Abnormal movements: None Coordination:  There is no decremation with finger taps or hand opening and closing bilaterally. Gait and Station: The patient arrived on a stretcher.  He was unbuckled and given a gait belt and walker.  He requires some assistance off of the stretcher, but once off of  it, he actually walks very quickly down the hall and has no shuffling.  He has mild difficulty with the turns, but he actually picks up the walker in the turn instead of leaving it on the floor.  ASSESSMENT/PLAN:  1.   Akinetic idiopathic Parkinson's disease.  The patient was diagnosed in approximately 2011  -We discussed the diagnosis as well as pathophysiology of the disease.  We discussed treatment options as well as prognostic indicators.  Patient education was provided.  -We discussed that it used to be thought that levodopa would increase risk of melanoma but now it is believed that Parkinsons itself likely increases risk of melanoma. he is to get regular skin checks.  -The patient will continue carbidopa/levodopa 25/100, 2.5 tablets 4 times per day.  Patient states he is not sure if medication works.  However, it appears that on/off testing at [facility] demonstrate efficacy of medication.  He takes the fourth dosage he is already going to in the middle of the night.  I talked to him about potentially changing this to the CR formulation so that he does not have to take in the middle the night, but we decided to hold on that for right now.  -Patient has gotten markedly worse after urinary tract infection, but only has 2 days of antibiotics left and is almost back to baseline.  Talked about the fact that urinary tract infections can have.  He should follow-up with Dr. Waynard Edwards as he is still having coughing and wheezing.  We were able to schedule an appointment with him tomorrow for this and for suture removal in the hand.  I asked me to do that, but I told him that this is not a service we do here.  -RX lift chair  -recommended gait belt  -PT/ST at home and cx neurorehab for now  -urology referral -Set this up at [facility] urology for February 27.  -information on ramps given (will have Child psychotherapist contact them)  -Levodopa was refilled today.  -will schedule for WC eval  -ask about hospital bed and  don't recommend yet  -discussed importance of hydration and what that means  2.  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  His Moca was 12/30 in November, 2018.  This is a common part of Parkinson's disease and we discussed this today.  We talked about the nature, etiology and pathophysiology as well as the degenerative nature.  The patient is on Aricept.  We discussed the importance of mental and physical exercise in the treatment of dementia, and discussed in detail what these things mean.  We discussed community resources and addressed safety in the home.  We discussed caregiver support and caregiver resources.  We offered our PD social worker as part of a support system.  -I am concerned about day/night reversal.  Talked about regular schedule.    3.  Follow up is anticipated in the next few months, sooner should new neurologic issues arise.  Much greater than 50% of this visit was spent in counseling and coordinating care.  Total face to face time:  40 min    Cc:  Randy Ran, MD

## 2017-10-18 ENCOUNTER — Ambulatory Visit: Payer: Medicare Other | Admitting: Neurology

## 2017-10-18 ENCOUNTER — Ambulatory Visit: Payer: Medicare Other | Admitting: Physical Therapy

## 2017-10-18 ENCOUNTER — Encounter: Payer: Self-pay | Admitting: Neurology

## 2017-10-18 ENCOUNTER — Encounter: Payer: Medicare Other | Admitting: Speech Pathology

## 2017-10-18 VITALS — BP 152/80 | HR 70

## 2017-10-18 DIAGNOSIS — R32 Unspecified urinary incontinence: Secondary | ICD-10-CM | POA: Diagnosis not present

## 2017-10-18 DIAGNOSIS — G20 Parkinson's disease: Secondary | ICD-10-CM | POA: Diagnosis not present

## 2017-10-18 DIAGNOSIS — F0280 Dementia in other diseases classified elsewhere without behavioral disturbance: Secondary | ICD-10-CM

## 2017-10-18 DIAGNOSIS — N390 Urinary tract infection, site not specified: Secondary | ICD-10-CM | POA: Diagnosis not present

## 2017-10-18 DIAGNOSIS — G20A1 Parkinson's disease without dyskinesia, without mention of fluctuations: Secondary | ICD-10-CM

## 2017-10-18 MED ORDER — CARBIDOPA-LEVODOPA 25-100 MG PO TABS: 2.5000 | ORAL_TABLET | Freq: Four times a day (QID) | ORAL | 1 refills | Status: DC

## 2017-10-18 MED ORDER — AMBULATORY NON FORMULARY MEDICATION: 0 refills | Status: DC

## 2017-10-18 NOTE — Patient Instructions (Addendum)
1. Appointment made at [facility] for 10/26/17 at 12:45 with Dr. Mena GoesEskridge. They are located at [address] [place] [address]. If this is not a good date/time please call [phone] to reschedule.   2. [facility]. They will contact you directly to set up care.  3. Appointment scheduled with Dr. Laurey MoralePerini's office tomorrow (10/19/17) at 2:00 pm to remove sutures.   4. Will send referral for power wheelchair. Debbie with [facility] will contact you about this evaluation.

## 2017-10-19 ENCOUNTER — Telehealth: Payer: Self-pay | Admitting: Neurology

## 2017-10-19 NOTE — Telephone Encounter (Signed)
Referral faxed to Zenia Residesebbie Roach for wheelchair evaluation at [phone] with confirmation received. She will contact patient/family.   Notes faxed to [facility] at [phone] with confirmation received.

## 2017-10-20 ENCOUNTER — Encounter: Payer: Self-pay | Admitting: Neurology

## 2017-10-21 ENCOUNTER — Encounter: Payer: Self-pay | Admitting: Psychology

## 2017-10-21 ENCOUNTER — Ambulatory Visit: Payer: Medicare Other | Admitting: Physical Therapy

## 2017-10-21 NOTE — Progress Notes (Signed)
TC to patient's wife and left a message about the [facility] [employer] [place] [facility].  I provided her with the contact information of Ernie AvenaLinda Hopkins [phone].  I did  mention that this is income based ramp program and they provide short-term emergency ramps in addition to long-term ramps that are built  at the home.

## 2017-10-25 ENCOUNTER — Encounter: Payer: Medicare Other | Admitting: Speech Pathology

## 2017-10-25 ENCOUNTER — Ambulatory Visit: Payer: Medicare Other | Admitting: Physical Therapy

## 2017-10-26 ENCOUNTER — Telehealth: Payer: Self-pay | Admitting: Neurology

## 2017-10-26 ENCOUNTER — Encounter: Payer: Self-pay | Admitting: Neurology

## 2017-10-26 MED ORDER — CARBIDOPA-LEVODOPA ER 50-200 MG PO TBCR: 1.0000 | EXTENDED_RELEASE_TABLET | Freq: Every day | ORAL | 1 refills | Status: DC

## 2017-10-26 NOTE — Telephone Encounter (Signed)
Needs  rx for a stair lift with the wording as following  Stair lift is needed for Randy Murphy Date of Birth 03/10/1932 [employer] customer number is [phone]. If we can fax it to [phone] today she can have it installed tomorrow. She would liek to get it done as soon as possible

## 2017-10-26 NOTE — Telephone Encounter (Signed)
Letter faxed to number provided with confirmation received. Patient's wife made aware.

## 2017-10-27 ENCOUNTER — Encounter: Payer: Medicare Other | Admitting: Speech Pathology

## 2017-10-27 ENCOUNTER — Ambulatory Visit: Payer: Medicare Other | Admitting: Physical Therapy

## 2017-11-01 ENCOUNTER — Encounter: Payer: Medicare Other | Admitting: Speech Pathology

## 2017-11-01 ENCOUNTER — Ambulatory Visit: Payer: Medicare Other | Admitting: Physical Therapy

## 2017-11-03 ENCOUNTER — Ambulatory Visit: Payer: Medicare Other | Admitting: Physical Therapy

## 2017-11-03 ENCOUNTER — Encounter: Payer: Medicare Other | Admitting: Speech Pathology

## 2017-11-03 ENCOUNTER — Encounter: Payer: Self-pay | Admitting: Neurology

## 2017-11-04 ENCOUNTER — Ambulatory Visit: Payer: Medicare Other | Admitting: Physical Therapy

## 2017-11-08 ENCOUNTER — Encounter: Payer: Medicare Other | Admitting: Speech Pathology

## 2017-11-08 ENCOUNTER — Ambulatory Visit: Payer: Medicare Other | Admitting: Physical Therapy

## 2017-11-10 ENCOUNTER — Ambulatory Visit: Payer: Medicare Other | Admitting: Physical Therapy

## 2017-11-10 ENCOUNTER — Encounter: Payer: Medicare Other | Admitting: Speech Pathology

## 2017-11-15 ENCOUNTER — Other Ambulatory Visit: Payer: Self-pay | Admitting: Neurology

## 2017-11-17 ENCOUNTER — Encounter: Payer: Medicare Other | Admitting: Speech Pathology

## 2017-11-22 ENCOUNTER — Encounter: Payer: Medicare Other | Admitting: Speech Pathology

## 2017-11-24 ENCOUNTER — Encounter: Payer: Medicare Other | Admitting: Speech Pathology

## 2017-12-08 NOTE — Progress Notes (Deleted)
Randy Murphy was seen today in the movement disorders clinic for neurologic consultation at the request of Dr. Zola Button.  His PCP is Randy Ran, MD.  This patient is accompanied in the office by his wife who supplements the history.The consultation is for the evaluation of PD.  Pt has also previously seen Dr. Vickey Huger.  I have reviewed neurology records made available to me.  The patient last saw Dr. Zola Button on July 14, 2017.  Records from initial diagnosis are not available.  Patient reports that he was diagnosed in approximately 2011.  His first symptom was falls.  He is currently on carbidopa/levodopa 25/100, 2 tablets four times per day (8am/noon/5pm/3am).  He automatically wakes up at 3am and then takes his medication.  He isn't sure that medication makes a difference (appears that did on/off test and proved efficacy of carbidopa/levodopa at [facility])  10/18/17 update: Patient is seen today in follow-up.  This patient is accompanied in the office by his wife and son who supplements the history.He is on carbidopa/levodopa 25/100, 2.5 tablets 3 times per day and then takes 2.5 tablets in the middle of the night.  Records have been reviewed since last visit.  The patient was in the emergency room on February 8 and October 10, 2017 with falls.  With the first fall, he did have to have sutures.  Reports that he was walking too fast and fell forward and hit his head.  With the second fall, he was using his walker and mis-stepped and had ecchymosis in the emergency room in his left upper extremity and periorbital region.  Reports had 6 falls in one week.  Called PCP and tried to get urine test and told inconclusive for UTI but called in abx on 2/15. He has 2 days left and is markedly better and nearly back to baseline. Had a single hallucination when he wasn't doing well but none since. Son states there has been a dramatic improvement compared to over the weekend where he described patient as "catatonic."   I  sent him for physical therapy last visit and he had an initial screen, but was not able to get into therapy until February 22.  His wife is not sure that she will be able to safely get him there.  12/12/17 update:  Pt seen in f/u.  This patient is accompanied in the office by his {companion:[id number]} who supplements the history.  He is on carbidopa/levodopa 25/100, 2.5 tablets three times in the day and we started carbidopa/levodopa 50/200 at bedtime since he was previously taking IR in the middle of the night.  Set pt up last visit for home physical therapy.  Also set him up with referral to urology.  ***  PREVIOUS MEDICATIONS:  Catapres (for sweating, no help)  ALLERGIES:  No Known Allergies  CURRENT MEDICATIONS:  Outpatient Encounter Medications as of 12/12/2017  Medication Sig  . AMBULATORY NON FORMULARY MEDICATION Lift Chair DX: G20  . aspirin 81 MG tablet Take 81 mg by mouth daily.    . carbidopa-levodopa (SINEMET CR) 50-200 MG tablet Take 1 tablet by mouth at bedtime.  . carbidopa-levodopa (SINEMET IR) 25-100 MG tablet Take 2.5 tablets by mouth 4 (four) times daily. 3 am/ 8 am/ 12 pm/ 5 pm  . Cholecalciferol (VITAMIN D PO) Take by mouth.    . clonazePAM (KLONOPIN) 0.5 MG tablet Take 1 tablet (0.5 mg total) by mouth at bedtime.  . Coenzyme Q10 (CO Q 10 PO) Take by mouth  daily.    . donepezil (ARICEPT) 10 MG tablet TAKE 1 TABLET BY MOUTH AT  BEDTIME  . finasteride (PROSCAR) 5 MG tablet Take 5 mg by mouth daily.    . Flaxseed, Linseed, (FLAX SEED OIL PO) Take by mouth 2 (two) times daily.    Marland Kitchen. losartan (COZAAR) 25 MG tablet Take 25 mg by mouth daily. 1/2 tablet every evening  . MAGNESIUM PO Take by mouth daily.    . Melatonin 5 MG TABS Take 5 mg by mouth daily.  . Multiple Vitamins-Minerals (ICAPS AREDS FORMULA PO) Take 1 capsule by mouth 2 (two) times daily.  . Multiple Vitamins-Minerals (MULTIVITAMIN PO) Take 1 tablet by mouth daily.  . nitrofurantoin, macrocrystal-monohydrate,  (MACROBID) 100 MG capsule TAKE 1 CAPSULE BY MOUTH TWICE A DAY X 7 DAYS  . Probiotic Product (ALIGN PO) Take 1 tablet by mouth daily.  . TURMERIC PO Take by mouth.   No facility-administered encounter medications on file as of 12/12/2017.     PAST MEDICAL HISTORY:   Past Medical History:  Diagnosis Date  . Fatigue   . Hearing loss of both ears   . History of orthostatic hypotension   . Hyperlipidemia   . Hypersomnia, periodic 02/18/2014  . Hypertension   . Insomnia   . Osteopenia   . Parkinson's disease    Possible early parkinsons disease    PAST SURGICAL HISTORY:   Past Surgical History:  Procedure Laterality Date  . CATARACT EXTRACTION, BILATERAL    . NASAL SEPTUM SURGERY  1972    SOCIAL HISTORY:   Social History   Socioeconomic History  . Marital status: Married    Spouse name: Randy Murphy  . Number of children: 1  . Years of education: 3016  . Highest education level: Not on file  Occupational History  . Occupation: retired    Comment: [employer] - [employer]  Social Needs  . Financial resource strain: Not on file  . Food insecurity:    Worry: Not on file    Inability: Not on file  . Transportation needs:    Medical: Not on file    Non-medical: Not on file  Tobacco Use  . Smoking status: Never Smoker  . Smokeless tobacco: Never Used  Substance and Sexual Activity  . Alcohol use: No  . Drug use: No  . Sexual activity: Not on file  Lifestyle  . Physical activity:    Days per week: Not on file    Minutes per session: Not on file  . Stress: Not on file  Relationships  . Social connections:    Talks on phone: Not on file    Gets together: Not on file    Attends religious service: Not on file    Active member of club or organization: Not on file    Attends meetings of clubs or organizations: Not on file    Relationship status: Not on file  . Intimate partner violence:    Fear of current or ex partner: Not on file    Emotionally abused:  Not on file    Physically abused: Not on file    Forced sexual activity: Not on file  Other Topics Concern  . Not on file  Social History Narrative   Patient is married Randy Jones([name]) and lives at home with his wife.   Patient has one adult child.   Patient is retired.   Patient has a college education.   Patient is right-handed.   Patient drinks one  cup of coffee daily.    FAMILY HISTORY:   Family Status  Relation Name Status  . Mother died at 52 Deceased  . Father influenza Deceased  . Sister  Alive  . Son  Alive    ROS:  A complete 10 system review of systems was obtained and was unremarkable apart from what is mentioned above.  PHYSICAL EXAMINATION:    VITALS:   There were no vitals filed for this visit.  GEN:  The patient appears stated age and is in NAD. HEENT:  Normocephalic, atraumatic.  The mucous membranes are moist. The superficial temporal arteries are without ropiness or tenderness. CV:  RRR Lungs:  Has diffuse expiratory wheezes but otherwise no rales/rhonchi Neck/HEME:  There are no carotid bruits bilaterally.  Neurological examination:  Orientation: The patient is alert and oriented x3.  I did I did not repeat his Moca since it was just done at [facility] a few weeks ago.  At that point in time his Moca was 12/30. Cranial nerves: There is good facial symmetry. Pupils are equal round and minimally reactive to light bilaterally. Fundoscopic exam is attempted but the disc margins are not well visualized bilaterally. Extraocular muscles are intact. The visual fields are full to confrontational testing. The speech is fluent and clear. He is hypophonic.  Soft palate rises symmetrically and there is no tongue deviation. Hearing is intact to conversational tone. Sensation: Sensation is intact to light and pinprick throughout (facial, trunk, extremities). Vibration is decreased at the bilateral big toe. There is no extinction with double simultaneous stimulation. There is no  sensory dermatomal level identified. Motor: Strength is 5/5 in the bilateral upper and lower extremities.   Shoulder shrug is equal and symmetric.  There is no pronator drift. Deep tendon reflexes: Deep tendon reflexes are 2/4 at the bilateral biceps, triceps, brachioradialis, patella and achilles. Plantar responses are downgoing bilaterally.  Movement examination: Tone: There is normal tone in the bilateral upper extremities.  The tone in the lower extremities is normal.  Abnormal movements: None Coordination:  There is no decremation with finger taps or hand opening and closing bilaterally. Gait and Station: The patient arrived on a stretcher.  He was unbuckled and given a gait belt and walker.  He requires some assistance off of the stretcher, but once off of it, he actually walks very quickly down the hall and has no shuffling.  He has mild difficulty with the turns, but he actually picks up the walker in the turn instead of leaving it on the floor.  ASSESSMENT/PLAN:  1.   Akinetic idiopathic Parkinson's disease.  The patient was diagnosed in approximately 2011  -We discussed the diagnosis as well as pathophysiology of the disease.  We discussed treatment options as well as prognostic indicators.  Patient education was provided.  -We discussed that it used to be thought that levodopa would increase risk of melanoma but now it is believed that Parkinsons itself likely increases risk of melanoma. he is to get regular skin checks.  -The patient will continue carbidopa/levodopa 25/100, 2.5 tablets tid   -continue carbidopa/levodopa 50/200 at bed  2.  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  His Moca was 12/30 in November, 2018.  This is a common part of Parkinson's disease and we discussed this today.  We talked about the nature, etiology and pathophysiology as well as the degenerative nature.  The patient is on Aricept.  We discussed the importance of mental  and  physical exercise in the treatment of dementia, and discussed in detail what these things mean.  We discussed community resources and addressed safety in the home.  We discussed caregiver support and caregiver resources.  We offered our PD social worker as part of a support system.  -I am concerned about day/night reversal.  Talked about regular schedule.    3.  ***    Cc:  Randy Ran, MD

## 2017-12-12 ENCOUNTER — Ambulatory Visit: Payer: Medicare Other | Admitting: Neurology

## 2017-12-13 ENCOUNTER — Telehealth: Payer: Self-pay | Admitting: Neurology

## 2017-12-13 NOTE — Telephone Encounter (Signed)
Gerlene Burdockoach, Debbie N sent to Silvio PateMcCracken, Kenzee Bassin L, CMA        Patient's wife, [name] decided since installing a stair lift that patient's conditions have improved and requested we hold off on pursuing power wheelchair or custom manual wheelchair for now. Plese let us know if you would like for us to move forward. Thanks!

## 2018-01-09 ENCOUNTER — Other Ambulatory Visit: Payer: Self-pay | Admitting: Neurology

## 2018-01-10 NOTE — Telephone Encounter (Signed)
Per last note patient to remain on medications. Dr. Arbutus Leas can you please sign?

## 2018-01-12 ENCOUNTER — Other Ambulatory Visit: Payer: Self-pay | Admitting: Neurology

## 2018-01-12 MED ORDER — CARBIDOPA-LEVODOPA ER 50-200 MG PO TBCR: 1.0000 | EXTENDED_RELEASE_TABLET | Freq: Every day | ORAL | 1 refills | Status: DC

## 2018-02-10 ENCOUNTER — Ambulatory Visit: Payer: Medicare Other | Admitting: Neurology

## 2018-03-08 ENCOUNTER — Telehealth: Payer: Self-pay | Admitting: Neurology

## 2018-03-08 NOTE — Telephone Encounter (Signed)
Patients wife states pt has fell 4 times in the past 5 days and after 3pm everyday pt seems confused and does not know where to go. Pt wife also states his legs become jelly like. They are wanting to know if this is due to the parkinson's or could it be a medication. Please advise.

## 2018-03-09 NOTE — Telephone Encounter (Signed)
Patient's wife made aware. They will keep appt next Tuesday and go to ER if anything worsens.

## 2018-03-09 NOTE — Telephone Encounter (Signed)
Spoke with patient's wife who states he has gotten worse in the last two weeks. Increased falls. No LOC. No injury, but having to call EMS for help lifting patient multiple times. His falls are often in the middle of the night when patient tries to go to the bathroom by himself.  Increased confusion. States worse the last two weeks.  No recent illnesses. No urinary symptoms (other than normal frequency.) He is currently undergoing treatment for overactive bladder with urology.  Patient's wife wants a hospital bed. Last note had stated that Dr. Arbutus Leasat did not want to write for that yet.   Please advise.

## 2018-03-09 NOTE — Telephone Encounter (Signed)
Left message on machine for patient's wife to call back.   

## 2018-03-09 NOTE — Telephone Encounter (Signed)
Can we discuss at appt next week?  If urgent, go to ER

## 2018-03-13 NOTE — Progress Notes (Signed)
Randy Murphy was seen today in the movement disorders clinic for neurologic consultation at the request of Randy Murphy.  His PCP is Randy Murphy, [name], MD.  This patient is accompanied in the office by his wife who supplements the history.The consultation is for the evaluation of PD.  Pt has also previously seen Dr. Vickey Murphy.  I have reviewed neurology records made available to me.  The patient last saw Randy Murphy on July 14, 2017.  Records from initial diagnosis are not available.  Patient reports that he was diagnosed in approximately 2011.  His first symptom was falls.  He is currently on carbidopa/levodopa 25/100, 2 tablets four times per day (8am/noon/5pm/3am).  He automatically wakes up at 3am and then takes his medication.  He isn't sure that medication makes a difference (appears that did on/off test and proved efficacy of carbidopa/levodopa at [facility])  10/18/17 update: Patient is seen today in follow-up.  This patient is accompanied in the office by his wife and son who supplements the history.He is on carbidopa/levodopa 25/100, 2.5 tablets 3 times per day and then takes 2.5 tablets in the middle of the night.  Records have been reviewed since last visit.  The patient was in the emergency room on February 8 and October 10, 2017 with falls.  With the first fall, he did have to have sutures.  Reports that he was walking too fast and fell forward and hit his head.  With the second fall, he was using his walker and mis-stepped and had ecchymosis in the emergency room in his left upper extremity and periorbital region.  Reports had 6 falls in one week.  Called PCP and tried to get urine test and told inconclusive for UTI but called in abx on 2/15. He has 2 days left and is markedly better and nearly back to baseline. Had a single hallucination when he wasn't doing well but none since. Son states there has been a dramatic improvement compared to over the weekend where he described patient as "catatonic."   I  sent him for physical therapy last visit and he had an initial screen, but was not able to get into therapy until February 22.  His wife is not sure that she will be able to safely get him there.  03/14/18 update: Patient is seen today in follow-up.  The patient is accompanied by his wife who supplements the history.  Patient is on carbidopa/levodopa 25/100, 2-1/2 tablets 3 times per day (quit taking in the middle of the night dosage).   He is still on carbidopa/levodopa 50/200 at bedtime.  He was given information on wheelchairs last visit and they decided not to pursue this.  They have gotten a stair lift placed in the home since our last visit and found that mobility was better this way.  They have recently called with multiple falls.  He had 6 falls in June and 6 in the first 2 weeks of July.  Records indicated that falls are often in the middle of the night when he gets up to use the bathroom by himself.  Wife indicates today however that falls can be any time of day.  Doesn't seem to be associated with wearing off of medication.  More confusion at late in day.  No hallucination.  When falling, it may be with or without use of the walker.  He has trouble when backing up to the chair to sit and then will fall.  Last home PT was a  month ago.  Was working out at [employer] but can't get there now.  Went to urology and did nerve stim at the ankle and that has helped with bladder issues/nocturia.  Wanting hospital bed.  Spending 10 hours per day in bed and then will get up and sleep in chair.  Trouble rolling over in the bed.  Trouble getting in and out of the bed.    PREVIOUS MEDICATIONS:  Catapres (for sweating, no help)  ALLERGIES:  No Known Allergies  CURRENT MEDICATIONS:  Outpatient Encounter Medications as of 03/14/2018  Medication Sig  . carbidopa-levodopa (SINEMET CR) 50-200 MG tablet Take 1 tablet by mouth at bedtime.  . carbidopa-levodopa (SINEMET IR) 25-100 MG tablet TAKE 2 AND 1/2 TABLETS BY  MOUTH 4  TIMES DAILY AT 3  AM, 8 AM, 12 PM, 5 PM (Patient taking differently: TAKE 2 AND 1/2 TABLETS BY  MOUTH at 8 am, 12 pm, and 5 pm)  . Cholecalciferol (VITAMIN D PO) Take by mouth.    . clonazePAM (KLONOPIN) 0.5 MG tablet TAKE 1 TABLET BY MOUTH AT  BEDTIME  . Coenzyme Q10 (CO Q 10 PO) Take by mouth daily.    Marland Kitchen donepezil (ARICEPT) 10 MG tablet TAKE 1 TABLET BY MOUTH AT  BEDTIME  . finasteride (PROSCAR) 5 MG tablet Take 5 mg by mouth daily.    . Flaxseed, Linseed, (FLAX SEED OIL PO) Take by mouth 2 (two) times daily.    . fludrocortisone (FLORINEF) 0.1 MG tablet Take 0.1 mcg by mouth 2 (two) times daily.  Marland Kitchen ibandronate (BONIVA) 150 MG tablet Take 150 mg by mouth every 30 (thirty) days. Take in the morning with a full glass of water, on an empty stomach, and do not take anything else by mouth or lie down for the next 30 min.  Marland Kitchen losartan (COZAAR) 25 MG tablet Take 25 mg by mouth daily. 1/2 tablet every evening  . MAGNESIUM PO Take by mouth daily.    . Multiple Vitamins-Minerals (ICAPS AREDS FORMULA PO) Take 1 capsule by mouth 2 (two) times daily.  . Multiple Vitamins-Minerals (MULTIVITAMIN PO) Take 1 tablet by mouth daily.  . Probiotic Product (ALIGN PO) Take 1 tablet by mouth daily.  . TURMERIC PO Take by mouth.  . Carbidopa-Levodopa ER (SINEMET CR) 25-100 MG tablet controlled release Take 2 tablets by mouth 4 (four) times daily.  . [DISCONTINUED] AMBULATORY NON FORMULARY MEDICATION Lift Chair DX: G20  . [DISCONTINUED] aspirin 81 MG tablet Take 81 mg by mouth daily.    . [DISCONTINUED] Melatonin 5 MG TABS Take 5 mg by mouth daily.  . [DISCONTINUED] nitrofurantoin, macrocrystal-monohydrate, (MACROBID) 100 MG capsule TAKE 1 CAPSULE BY MOUTH TWICE A DAY X 7 DAYS   No facility-administered encounter medications on file as of 03/14/2018.     PAST MEDICAL HISTORY:   Past Medical History:  Diagnosis Date  . Fatigue   . Hearing loss of both ears   . History of orthostatic hypotension   .  Hyperlipidemia   . Hypersomnia, periodic 02/18/2014  . Hypertension   . Insomnia   . Osteopenia   . Parkinson's disease    Possible early parkinsons disease    PAST SURGICAL HISTORY:   Past Surgical History:  Procedure Laterality Date  . CATARACT EXTRACTION, BILATERAL    . NASAL SEPTUM SURGERY  1972    SOCIAL HISTORY:   Social History   Socioeconomic History  . Marital status: Married    Spouse name: Randy Murphy  . Number of  children: 1  . Years of education: 48  . Highest education level: Not on file  Occupational History  . Occupation: retired    Comment: Dealer - Building surveyor  Social Needs  . Financial resource strain: Not on file  . Food insecurity:    Worry: Not on file    Inability: Not on file  . Transportation needs:    Medical: Not on file    Non-medical: Not on file  Tobacco Use  . Smoking status: Never Smoker  . Smokeless tobacco: Never Used  Substance and Sexual Activity  . Alcohol use: No  . Drug use: No  . Sexual activity: Not on file  Lifestyle  . Physical activity:    Days per week: Not on file    Minutes per session: Not on file  . Stress: Not on file  Relationships  . Social connections:    Talks on phone: Not on file    Gets together: Not on file    Attends religious service: Not on file    Active member of club or organization: Not on file    Attends meetings of clubs or organizations: Not on file    Relationship status: Not on file  . Intimate partner violence:    Fear of current or ex partner: Not on file    Emotionally abused: Not on file    Physically abused: Not on file    Forced sexual activity: Not on file  Other Topics Concern  . Not on file  Social History Narrative   Patient is married Randy Murphy) and lives at home with his wife.   Patient has one adult child.   Patient is retired.   Patient has a college education.   Patient is right-handed.   Patient drinks one cup of coffee daily.    FAMILY  HISTORY:   Family Status  Relation Name Status  . Mother died at 49 Deceased  . Father influenza Deceased  . Sister  Alive  . Son  Alive    ROS:  Review of Systems  Constitutional: Negative.   HENT: Negative.   Eyes: Negative.   Respiratory: Negative.   Gastrointestinal: Negative.   Genitourinary: Negative.   Musculoskeletal: Positive for falls.  Skin: Negative.      PHYSICAL EXAMINATION:    VITALS:   Vitals:   03/14/18 1500  BP: (!) 144/90  Pulse: 74  SpO2: 95%   GEN:  The patient appears stated age and is in NAD. HEENT:  Normocephalic, atraumatic.  The mucous membranes are moist. The superficial temporal arteries are without ropiness or tenderness. CV:  RRR Lungs:  CTAB Neck/HEME:  There are no carotid bruits bilaterally.  Neurological examination:  Orientation: The patient is alert and oriented to person and place.  He has trouble with time. Cranial nerves: There is good facial symmetry. The speech is fluent and clear. Soft palate rises symmetrically and there is no tongue deviation. Hearing is intact to conversational tone. Sensation: Sensation is intact to light touch throughout Motor: Strength is at least antigravity x4.  Movement examination: Tone: There is normal tone throughout. Abnormal movements: None today. Coordination:  There is mild decremation with RAM's, seen primarily with finger taps bilaterally. Gait and Station: The patient pushes off of the transport chair.  He is given a walker.  He actually walks quite well with a walker today.  He does have a stooped posture.    ASSESSMENT/PLAN:  1.   Akinetic idiopathic Parkinson's disease.  The patient was diagnosed in approximately 2011  -We discussed the diagnosis as well as pathophysiology of the disease.  We discussed treatment options as well as prognostic indicators.  Patient education was provided.  -We discussed that it used to be thought that levodopa would increase risk of melanoma but now it  is believed that Parkinsons itself likely increases risk of melanoma. he is to get regular skin checks.  -change carbidopa/levodopa 25/100 IR to carbidopa/levodopa 25/100 CR, 2 po at 7am/11am/3pm/7pm.  Levodopa challenge test previously done at [facility] demonstrated efficacy of medication.    -Continue carbidopa/levodopa 50/200 at bedtime.  -RX hospital bed.    -Lab work today including CBC, chemistry, TSH.  Urinalysis was just completed on March 10, 2018 at primary care physician office.  2.  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  His Moca was 12/30 in November, 2018.  This is a common part of Parkinson's disease and we discussed this today.  We talked about the nature, etiology and pathophysiology as well as the degenerative nature.  The patient is on Aricept.  We discussed the importance of mental and physical exercise in the treatment of dementia, and discussed in detail what these things mean.  We discussed community resources and addressed safety in the home.  We discussed caregiver support and caregiver resources.  We offered our PD social worker as part of a support system.  -Patient does have 24 hour/day care.  He has caregivers coming into the home.  This has been greatly useful for the patient's wife.  3.  Follow-up in the next 5 months, sooner should new neurologic issues arise.  His wife is to contact me should the above changes not be helpful in terms of fall frequency.  Safety discussed in detail.  Greater than 50% of the 25-minute visit spent in counseling.    Cc:  Randy Ran, MD

## 2018-03-14 ENCOUNTER — Ambulatory Visit: Payer: Medicare Other | Admitting: Neurology

## 2018-03-14 ENCOUNTER — Encounter: Payer: Self-pay | Admitting: Neurology

## 2018-03-14 VITALS — BP 144/90 | HR 74

## 2018-03-14 DIAGNOSIS — G20 Parkinson's disease: Secondary | ICD-10-CM

## 2018-03-14 DIAGNOSIS — G9340 Encephalopathy, unspecified: Secondary | ICD-10-CM | POA: Diagnosis not present

## 2018-03-14 DIAGNOSIS — R296 Repeated falls: Secondary | ICD-10-CM | POA: Diagnosis not present

## 2018-03-14 DIAGNOSIS — Z5181 Encounter for therapeutic drug level monitoring: Secondary | ICD-10-CM

## 2018-03-14 MED ORDER — CARBIDOPA-LEVODOPA ER 25-100 MG PO TBCR: 2.0000 | EXTENDED_RELEASE_TABLET | Freq: Four times a day (QID) | ORAL | 5 refills | Status: DC

## 2018-03-14 NOTE — Patient Instructions (Addendum)
1. Your provider has requested that you have labwork completed today. Please go to [facility]. See attached.   2. Stop Carbidopa Levodopa IR.  Start Carbidopa Levodopa 25/100 CR - 2 tablets at 7 am, 2 tablets at 11 am, 2 tablets at 3 pm, and 2 tablets at 7 pm.   3. We have placed orders for a hospital bed and [facility] should be calling you about this.

## 2018-03-15 ENCOUNTER — Telehealth: Payer: Self-pay | Admitting: Neurology

## 2018-03-15 LAB — CBC WITH DIFFERENTIAL/PLATELET
Basophils Absolute: 39 cells/uL (ref 0–200)
Basophils Relative: 0.5 %
Eosinophils Absolute: 108 cells/uL (ref 15–500)
Eosinophils Relative: 1.4 %
HCT: 42.5 % (ref 38.5–50.0)
Hemoglobin: 14.2 g/dL (ref 13.2–17.1)
Lymphs Abs: 1394 cells/uL (ref 850–3900)
MCH: 30.9 pg (ref 27.0–33.0)
MCHC: 33.4 g/dL (ref 32.0–36.0)
MCV: 92.4 fL (ref 80.0–100.0)
MPV: 11.1 fL (ref 7.5–12.5)
Monocytes Relative: 11.7 %
Neutro Abs: 5259 cells/uL (ref 1500–7800)
Neutrophils Relative %: 68.3 %
Platelets: 204 10*3/uL (ref 140–400)
RBC: 4.6 10*6/uL (ref 4.20–5.80)
RDW: 12.2 % (ref 11.0–15.0)
Total Lymphocyte: 18.1 %
WBC mixed population: 901 cells/uL (ref 200–950)
WBC: 7.7 10*3/uL (ref 3.8–10.8)

## 2018-03-15 LAB — COMPREHENSIVE METABOLIC PANEL
AG Ratio: 1.7 (calc) (ref 1.0–2.5)
ALT: 10 U/L (ref 9–46)
AST: 17 U/L (ref 10–35)
Albumin: 4.3 g/dL (ref 3.6–5.1)
Alkaline phosphatase (APISO): 74 U/L (ref 40–115)
BUN/Creatinine Ratio: 26 (calc) — ABNORMAL HIGH (ref 6–22)
BUN: 29 mg/dL — ABNORMAL HIGH (ref 7–25)
CO2: 26 mmol/L (ref 20–32)
Calcium: 9.2 mg/dL (ref 8.6–10.3)
Chloride: 105 mmol/L (ref 98–110)
Creat: 1.13 mg/dL — ABNORMAL HIGH (ref 0.70–1.11)
Globulin: 2.6 g/dL (calc) (ref 1.9–3.7)
Glucose, Bld: 113 mg/dL (ref 65–139)
Potassium: 4.2 mmol/L (ref 3.5–5.3)
Sodium: 140 mmol/L (ref 135–146)
Total Bilirubin: 0.6 mg/dL (ref 0.2–1.2)
Total Protein: 6.9 g/dL (ref 6.1–8.1)

## 2018-03-15 LAB — TSH: TSH: 1.74 mIU/L (ref 0.40–4.50)

## 2018-03-15 NOTE — Telephone Encounter (Signed)
Mychart message sent to patient.

## 2018-03-15 NOTE — Telephone Encounter (Signed)
-----   Message from Octaviano Battyebecca S Tat, DO sent at 03/15/2018 10:30 AM EDT ----- I have reviewed all lab results which are normal or stable. Please inform the patient.

## 2018-03-16 ENCOUNTER — Other Ambulatory Visit: Payer: Self-pay | Admitting: Neurology

## 2018-03-16 MED ORDER — CARBIDOPA-LEVODOPA ER 25-100 MG PO TBCR: 2.0000 | EXTENDED_RELEASE_TABLET | Freq: Four times a day (QID) | ORAL | 1 refills | Status: DC

## 2018-03-16 NOTE — Telephone Encounter (Signed)
Received request to have medication sent to Pharmacy.

## 2018-03-23 ENCOUNTER — Telehealth: Payer: Self-pay | Admitting: Neurology

## 2018-03-23 DIAGNOSIS — R296 Repeated falls: Secondary | ICD-10-CM

## 2018-03-23 DIAGNOSIS — G20 Parkinson's disease: Secondary | ICD-10-CM

## 2018-03-23 NOTE — Telephone Encounter (Signed)
Patient's wife is needing to know if Dr. Arbutus Leasat could fax a New order for her husband to get an Electric bed? She is unable to use the crank on the bed he has now. Thanks

## 2018-03-23 NOTE — Telephone Encounter (Signed)
[facility] [place] [facility] about [facility] with [facility] and best way to go about it. Waiting to hear back before contacting wife.

## 2018-03-23 NOTE — Telephone Encounter (Signed)
Patient wife called and states that hospital bed was delivered and the patient is having a hard time getting it due to the long rail on the side of the bed. They want a half rail ordered for him. [facility].

## 2018-03-24 NOTE — Telephone Encounter (Signed)
Randy NightingalePierce, [name] sent to Silvio PateMcCracken, Jade L, CMA        Fully electric beds are not covered by insurance. We would need an order for half rails in order to switch the rails out. Hope this helps.   Previous Messages    ----- Message -----  From: Silvio PateMcCracken, Jade L, CMA  Sent: 03/23/2018 10:39 AM  To: Randy Murphy  Subject: Hospital bed questions              Ashley MurrainHi [name],   I have received two messages from patient's wife this morning. They just got hospital bed delivered, but she states that he can not get in and out of the bed and needs a shorter railing ordered and she can not use the manual crank and wants an electric bed.   What is the best way to go about handling this? Thanks so much.   Lesly RubensteinJade      Patient's wife made aware and is okay with order for rails. This has been entered into Epic and message sent to [place].

## 2018-03-27 ENCOUNTER — Telehealth: Payer: Self-pay | Admitting: Neurology

## 2018-03-27 NOTE — Telephone Encounter (Signed)
[employer] calling requesting clarification on on sinemet, Ref [id number].

## 2018-03-27 NOTE — Telephone Encounter (Signed)
Spoke with pharmacist and confirmed that patient should be taking Carbidopa Levodopa 25/100 CR - 2 QID and 50/200 CR 1 hs.

## 2018-04-07 ENCOUNTER — Telehealth: Payer: Self-pay | Admitting: Neurology

## 2018-04-07 NOTE — Telephone Encounter (Signed)
Patient's wife called and stated that her husband saw Dr. Arbutus Leasat on July 16th. At that appt she changed his carbidopa-levodopa to 4xs a day. The patient is experiencing hallucinations,unable to walk/sit,and has fallen 5 times this week. The EMT has been out to their house 2 times this week. She wanted to see if there was something they could do to help him or maybe change his medication. Please call her back at [phone]. His pharmacy is [facility] [address] Thanks.

## 2018-04-07 NOTE — Telephone Encounter (Signed)
At last appt you changed Carbidopa Levodopa IR to CR 25/100 and patient instructed to take 2 QID.  I called to speak to patient's wife to figure out what was worse because all symptoms below have been present. She states he is now more confused. Needs help all the time with transition when it was periodically prior to medicine change. Please advise.

## 2018-04-07 NOTE — Telephone Encounter (Signed)
Try 2 po tid.  Would home health/PT be of value in the home?

## 2018-04-07 NOTE — Telephone Encounter (Signed)
Spoke with patient's wife and instructed on medication. Patient just finished home health PT a month ago.

## 2018-04-25 ENCOUNTER — Telehealth: Payer: Self-pay | Admitting: Neurology

## 2018-04-25 DIAGNOSIS — G20 Parkinson's disease: Secondary | ICD-10-CM

## 2018-04-25 NOTE — Telephone Encounter (Signed)
I thought that they had a transport chair?  Can we send PT to the home to work with them and get recommendations for things to help with shower, etc?

## 2018-04-25 NOTE — Telephone Encounter (Signed)
Patient finished home PT about 1.5-2 months ago. Okay to reorder?

## 2018-04-25 NOTE — Telephone Encounter (Signed)
yes

## 2018-04-25 NOTE — Telephone Encounter (Signed)
Received vm from 04/21/18 from patient's wife.  She states patient having a lot of weakness and falls. This had been worse the last few days. She had had to call EMS several times to help get him back up after falling. They are concerned and wanted to see about getting a smaller wheelchair due to narrow hallways in their home so that patient was not walking so much. Also asked about possible gait belt, or another option to help so he would not fall to get in and out of the shower. She is currently "bathing" him in the bed.   Please advise.

## 2018-04-25 NOTE — Telephone Encounter (Signed)
Patient's wife called back and agrees to try home health PT again to help with these things. She does have transport chair and they will use that.  [facility].

## 2018-04-25 NOTE — Telephone Encounter (Signed)
Left message on machine for patient to call back.

## 2018-05-16 ENCOUNTER — Other Ambulatory Visit: Payer: Self-pay | Admitting: Neurology

## 2018-07-05 ENCOUNTER — Other Ambulatory Visit: Payer: Self-pay | Admitting: Neurology

## 2018-07-09 ENCOUNTER — Inpatient Hospital Stay (HOSPITAL_COMMUNITY)
Admission: EM | Admit: 2018-07-09 | Discharge: 2018-07-14 | DRG: 291 | Disposition: A | Discharge status: Skilled Nursing Facility | Payer: Medicare Other | Attending: Internal Medicine | Admitting: Internal Medicine

## 2018-07-09 ENCOUNTER — Other Ambulatory Visit: Payer: Self-pay

## 2018-07-09 ENCOUNTER — Encounter (HOSPITAL_COMMUNITY): Payer: Self-pay | Admitting: *Deleted

## 2018-07-09 ENCOUNTER — Emergency Department (HOSPITAL_COMMUNITY): Payer: Medicare Other

## 2018-07-09 DIAGNOSIS — H9193 Unspecified hearing loss, bilateral: Secondary | ICD-10-CM | POA: Diagnosis present

## 2018-07-09 DIAGNOSIS — E871 Hypo-osmolality and hyponatremia: Secondary | ICD-10-CM | POA: Diagnosis not present

## 2018-07-09 DIAGNOSIS — F0280 Dementia in other diseases classified elsewhere without behavioral disturbance: Secondary | ICD-10-CM | POA: Diagnosis present

## 2018-07-09 DIAGNOSIS — I5043 Acute on chronic combined systolic (congestive) and diastolic (congestive) heart failure: Secondary | ICD-10-CM | POA: Diagnosis present

## 2018-07-09 DIAGNOSIS — Z79899 Other long term (current) drug therapy: Secondary | ICD-10-CM

## 2018-07-09 DIAGNOSIS — J189 Pneumonia, unspecified organism: Secondary | ICD-10-CM

## 2018-07-09 DIAGNOSIS — R001 Bradycardia, unspecified: Secondary | ICD-10-CM | POA: Diagnosis not present

## 2018-07-09 DIAGNOSIS — N179 Acute kidney failure, unspecified: Secondary | ICD-10-CM | POA: Diagnosis present

## 2018-07-09 DIAGNOSIS — I10 Essential (primary) hypertension: Secondary | ICD-10-CM | POA: Diagnosis not present

## 2018-07-09 DIAGNOSIS — I272 Pulmonary hypertension, unspecified: Secondary | ICD-10-CM | POA: Diagnosis present

## 2018-07-09 DIAGNOSIS — I441 Atrioventricular block, second degree: Secondary | ICD-10-CM | POA: Diagnosis not present

## 2018-07-09 DIAGNOSIS — I5023 Acute on chronic systolic (congestive) heart failure: Secondary | ICD-10-CM | POA: Diagnosis not present

## 2018-07-09 DIAGNOSIS — R7989 Other specified abnormal findings of blood chemistry: Secondary | ICD-10-CM | POA: Diagnosis not present

## 2018-07-09 DIAGNOSIS — I509 Heart failure, unspecified: Secondary | ICD-10-CM | POA: Diagnosis not present

## 2018-07-09 DIAGNOSIS — M7989 Other specified soft tissue disorders: Secondary | ICD-10-CM | POA: Diagnosis not present

## 2018-07-09 DIAGNOSIS — I480 Paroxysmal atrial fibrillation: Secondary | ICD-10-CM | POA: Diagnosis not present

## 2018-07-09 DIAGNOSIS — R627 Adult failure to thrive: Secondary | ICD-10-CM | POA: Diagnosis present

## 2018-07-09 DIAGNOSIS — I130 Hypertensive heart and chronic kidney disease with heart failure and stage 1 through stage 4 chronic kidney disease, or unspecified chronic kidney disease: Principal | ICD-10-CM | POA: Diagnosis present

## 2018-07-09 DIAGNOSIS — M8580 Other specified disorders of bone density and structure, unspecified site: Secondary | ICD-10-CM | POA: Diagnosis present

## 2018-07-09 DIAGNOSIS — I5021 Acute systolic (congestive) heart failure: Secondary | ICD-10-CM | POA: Diagnosis not present

## 2018-07-09 DIAGNOSIS — I420 Dilated cardiomyopathy: Secondary | ICD-10-CM | POA: Diagnosis present

## 2018-07-09 DIAGNOSIS — R296 Repeated falls: Secondary | ICD-10-CM | POA: Diagnosis present

## 2018-07-09 DIAGNOSIS — N183 Chronic kidney disease, stage 3 (moderate): Secondary | ICD-10-CM | POA: Diagnosis present

## 2018-07-09 DIAGNOSIS — E876 Hypokalemia: Secondary | ICD-10-CM | POA: Diagnosis not present

## 2018-07-09 DIAGNOSIS — G20 Parkinson's disease: Secondary | ICD-10-CM | POA: Diagnosis present

## 2018-07-09 DIAGNOSIS — G901 Familial dysautonomia [Riley-Day]: Secondary | ICD-10-CM | POA: Diagnosis not present

## 2018-07-09 DIAGNOSIS — I110 Hypertensive heart disease with heart failure: Secondary | ICD-10-CM | POA: Diagnosis not present

## 2018-07-09 DIAGNOSIS — E785 Hyperlipidemia, unspecified: Secondary | ICD-10-CM | POA: Diagnosis present

## 2018-07-09 DIAGNOSIS — R0602 Shortness of breath: Secondary | ICD-10-CM | POA: Diagnosis present

## 2018-07-09 DIAGNOSIS — I5041 Acute combined systolic (congestive) and diastolic (congestive) heart failure: Secondary | ICD-10-CM | POA: Diagnosis not present

## 2018-07-09 DIAGNOSIS — G4700 Insomnia, unspecified: Secondary | ICD-10-CM | POA: Diagnosis present

## 2018-07-09 DIAGNOSIS — I447 Left bundle-branch block, unspecified: Secondary | ICD-10-CM | POA: Diagnosis not present

## 2018-07-09 DIAGNOSIS — I4891 Unspecified atrial fibrillation: Secondary | ICD-10-CM | POA: Diagnosis not present

## 2018-07-09 DIAGNOSIS — I951 Orthostatic hypotension: Secondary | ICD-10-CM | POA: Diagnosis present

## 2018-07-09 DIAGNOSIS — Z6824 Body mass index (BMI) 24.0-24.9, adult: Secondary | ICD-10-CM | POA: Diagnosis not present

## 2018-07-09 DIAGNOSIS — I351 Nonrheumatic aortic (valve) insufficiency: Secondary | ICD-10-CM | POA: Diagnosis not present

## 2018-07-09 DIAGNOSIS — I340 Nonrheumatic mitral (valve) insufficiency: Secondary | ICD-10-CM | POA: Diagnosis not present

## 2018-07-09 HISTORY — DX: Unspecified dementia, unspecified severity, without behavioral disturbance, psychotic disturbance, mood disturbance, and anxiety: F03.90

## 2018-07-09 HISTORY — DX: Repeated falls: R29.6

## 2018-07-09 LAB — CBC WITH DIFFERENTIAL/PLATELET
Abs Immature Granulocytes: 0.03 10*3/uL (ref 0.00–0.07)
Basophils Absolute: 0 10*3/uL (ref 0.0–0.1)
Basophils Relative: 0 %
Eosinophils Absolute: 0 10*3/uL (ref 0.0–0.5)
Eosinophils Relative: 0 %
HCT: 44.8 % (ref 39.0–52.0)
Hemoglobin: 13.3 g/dL (ref 13.0–17.0)
Immature Granulocytes: 0 %
Lymphocytes Relative: 14 %
Lymphs Abs: 1 10*3/uL (ref 0.7–4.0)
MCH: 30.1 pg (ref 26.0–34.0)
MCHC: 29.7 g/dL — ABNORMAL LOW (ref 30.0–36.0)
MCV: 101.4 fL — ABNORMAL HIGH (ref 80.0–100.0)
Monocytes Absolute: 0.5 10*3/uL (ref 0.1–1.0)
Monocytes Relative: 8 %
Neutro Abs: 5.3 10*3/uL (ref 1.7–7.7)
Neutrophils Relative %: 78 %
Platelets: 251 10*3/uL (ref 150–400)
RBC: 4.42 MIL/uL (ref 4.22–5.81)
RDW: 14.5 % (ref 11.5–15.5)
WBC: 6.9 10*3/uL (ref 4.0–10.5)
nRBC: 0 % (ref 0.0–0.2)

## 2018-07-09 LAB — TSH: TSH: 3.757 u[IU]/mL (ref 0.350–4.500)

## 2018-07-09 LAB — BASIC METABOLIC PANEL
Anion gap: 8 (ref 5–15)
BUN: 22 mg/dL (ref 8–23)
CO2: 27 mmol/L (ref 22–32)
Calcium: 9.1 mg/dL (ref 8.9–10.3)
Chloride: 107 mmol/L (ref 98–111)
Creatinine, Ser: 1.06 mg/dL (ref 0.61–1.24)
GFR calc Af Amer: >60 mL/min (ref >60)
GFR calc non Af Amer: >60 mL/min (ref >60)
Glucose, Bld: 126 mg/dL — ABNORMAL HIGH (ref 70–99)
Potassium: 3.4 mmol/L — ABNORMAL LOW (ref 3.5–5.1)
Sodium: 142 mmol/L (ref 135–145)

## 2018-07-09 LAB — BRAIN NATRIURETIC PEPTIDE: B Natriuretic Peptide: 752 pg/mL — ABNORMAL HIGH (ref 0.0–100.0)

## 2018-07-09 LAB — I-STAT TROPONIN, ED: Troponin i, poc: 0.03 ng/mL (ref 0.00–0.08)

## 2018-07-09 LAB — STREP PNEUMONIAE URINARY ANTIGEN: Strep Pneumo Urinary Antigen: NEGATIVE

## 2018-07-09 LAB — TROPONIN I
Troponin I: 0.06 ng/mL (ref <0.03)
Troponin I: 0.08 ng/mL (ref <0.03)

## 2018-07-09 MED ORDER — CLONAZEPAM 0.5 MG PO TABS
0.5000 mg | ORAL_TABLET | Freq: Every day | ORAL | Status: DC
  Administered 2018-07-09 – 2018-07-13 (×5): 0.5 mg via ORAL
  Filled 2018-07-09 (×5): qty 1

## 2018-07-09 MED ORDER — SODIUM CHLORIDE 0.9 % IV SOLN
500.0000 mg | Freq: Once | INTRAVENOUS | Status: AC
  Administered 2018-07-09: 500 mg via INTRAVENOUS
  Filled 2018-07-09: qty 500

## 2018-07-09 MED ORDER — FINASTERIDE 5 MG PO TABS
5.0000 mg | ORAL_TABLET | Freq: Every day | ORAL | Status: DC
  Administered 2018-07-10 – 2018-07-14 (×5): 5 mg via ORAL
  Filled 2018-07-09 (×5): qty 1

## 2018-07-09 MED ORDER — SODIUM CHLORIDE 0.9 % IV SOLN: 250.0000 mL | INTRAVENOUS | Status: DC | PRN

## 2018-07-09 MED ORDER — ONDANSETRON HCL 4 MG/2ML IJ SOLN: 4.0000 mg | Freq: Four times a day (QID) | INTRAMUSCULAR | Status: DC | PRN

## 2018-07-09 MED ORDER — FUROSEMIDE 10 MG/ML IJ SOLN
20.0000 mg | Freq: Once | INTRAMUSCULAR | Status: AC
  Administered 2018-07-09: 20 mg via INTRAVENOUS
  Filled 2018-07-09: qty 2

## 2018-07-09 MED ORDER — ENOXAPARIN SODIUM 30 MG/0.3ML ~~LOC~~ SOLN
30.0000 mg | SUBCUTANEOUS | Status: DC
  Administered 2018-07-09 – 2018-07-10 (×2): 30 mg via SUBCUTANEOUS
  Filled 2018-07-09 (×2): qty 0.3

## 2018-07-09 MED ORDER — AZITHROMYCIN 500 MG PO TABS
500.0000 mg | ORAL_TABLET | ORAL | Status: DC
  Administered 2018-07-10: 500 mg via ORAL
  Filled 2018-07-09: qty 1

## 2018-07-09 MED ORDER — HYDROCODONE-ACETAMINOPHEN 5-325 MG PO TABS
1.0000 | ORAL_TABLET | ORAL | Status: DC | PRN
  Administered 2018-07-11: 1 via ORAL
  Filled 2018-07-09: qty 1

## 2018-07-09 MED ORDER — SODIUM CHLORIDE 0.9 % IV SOLN
1.0000 g | Freq: Once | INTRAVENOUS | Status: AC
  Administered 2018-07-09: 1 g via INTRAVENOUS
  Filled 2018-07-09: qty 10

## 2018-07-09 MED ORDER — IBANDRONATE SODIUM 150 MG PO TABS: 150.0000 mg | ORAL_TABLET | ORAL | Status: DC

## 2018-07-09 MED ORDER — FUROSEMIDE 10 MG/ML IJ SOLN
40.0000 mg | Freq: Two times a day (BID) | INTRAMUSCULAR | Status: DC
  Administered 2018-07-10 – 2018-07-13 (×7): 40 mg via INTRAVENOUS
  Filled 2018-07-09 (×7): qty 4

## 2018-07-09 MED ORDER — ACETAMINOPHEN 650 MG RE SUPP: 650.0000 mg | Freq: Four times a day (QID) | RECTAL | Status: DC | PRN

## 2018-07-09 MED ORDER — CARBIDOPA-LEVODOPA ER 50-200 MG PO TBCR
1.0000 | EXTENDED_RELEASE_TABLET | Freq: Every day | ORAL | Status: DC
  Administered 2018-07-09 – 2018-07-13 (×5): 1 via ORAL
  Filled 2018-07-09 (×6): qty 1

## 2018-07-09 MED ORDER — ONDANSETRON HCL 4 MG PO TABS: 4.0000 mg | ORAL_TABLET | Freq: Four times a day (QID) | ORAL | Status: DC | PRN

## 2018-07-09 MED ORDER — SODIUM CHLORIDE 0.9% FLUSH: 3.0000 mL | INTRAVENOUS | Status: DC | PRN

## 2018-07-09 MED ORDER — FLUDROCORTISONE ACETATE 0.1 MG PO TABS
0.2000 mg | ORAL_TABLET | Freq: Every day | ORAL | Status: DC
  Administered 2018-07-10: 0.2 mg via ORAL
  Filled 2018-07-09: qty 2

## 2018-07-09 MED ORDER — HYDRALAZINE HCL 20 MG/ML IJ SOLN: 5.0000 mg | Freq: Four times a day (QID) | INTRAMUSCULAR | Status: DC | PRN

## 2018-07-09 MED ORDER — CARBIDOPA-LEVODOPA ER 25-100 MG PO TBCR
2.0000 | EXTENDED_RELEASE_TABLET | ORAL | Status: DC
  Administered 2018-07-10 – 2018-07-14 (×18): 2 via ORAL
  Filled 2018-07-09 (×21): qty 2

## 2018-07-09 MED ORDER — ACETAMINOPHEN 500 MG PO TABS: 1000.0000 mg | ORAL_TABLET | Freq: Four times a day (QID) | ORAL | Status: DC | PRN

## 2018-07-09 MED ORDER — SODIUM CHLORIDE 0.9 % IV SOLN
1.0000 g | INTRAVENOUS | Status: DC
  Administered 2018-07-10: 1 g via INTRAVENOUS
  Filled 2018-07-09 (×2): qty 10

## 2018-07-09 MED ORDER — ACETAMINOPHEN 325 MG PO TABS: 650.0000 mg | ORAL_TABLET | Freq: Four times a day (QID) | ORAL | Status: DC | PRN

## 2018-07-09 MED ORDER — SODIUM CHLORIDE 0.9% FLUSH
3.0000 mL | Freq: Two times a day (BID) | INTRAVENOUS | Status: DC
  Administered 2018-07-09 – 2018-07-14 (×9): 3 mL via INTRAVENOUS

## 2018-07-09 NOTE — ED Triage Notes (Signed)
Wife present in room and reports  Pt was DX with PNA 10 days ago at his PCP office. Pt has been taking PO anti  Pt finished Anti-bx on Friday. Wife called PCP today and was instructed to come to ED. PCP reported to wife pt my have aspirated.

## 2018-07-09 NOTE — Progress Notes (Signed)
Pt's BP is 173/99. No PRN BP meds on file.  MD on call was notified.

## 2018-07-09 NOTE — H&P (Signed)
[facility]                                                                                    Patient Demographics  Randy Murphy, is a 84 y.o. male  CSN: [id number]  MRN: [id number]  DOB - 10-05-1931  Admit Date - 07/09/2018  Outpatient Primary MD for the patient is Rodrigo Ran, MD   With History of -  Past Medical History:  Diagnosis Date  . Fatigue   . Hearing loss of both ears   . History of orthostatic hypotension   . Hyperlipidemia   . Hypersomnia, periodic 02/18/2014  . Hypertension   . Insomnia   . Osteopenia   . Parkinson's disease    Possible early parkinsons disease      Past Surgical History:  Procedure Laterality Date  . CATARACT EXTRACTION, BILATERAL    . NASAL SEPTUM SURGERY  1972    in for   Chief Complaint  Patient presents with  . Pneumonia     HPI  Randy Murphy  is a 84 y.o. male, 84 year old male with past medical history significant for dementia and Parkinson's in addition to hypertension presenting with 2 weeks history of shortness of breath and cough in addition to fever or chills.  The patient was treated with a course of Levaquin by his primary care physician that finished 2 days ago however the patient started having more shortness of breath and cough in the last 2 days and he presented to our hospital for further management.  Patient denies any chest pains but his wife noted that his lower extremities have been swelling more.  His chest x-ray in the emergency room showed minimal left pleural effusion with atelectasis versus pneumonia at the left base.  No history of nausea or vomiting reported.    Review of Systems    In addition to the HPI above,  No Headache, No changes with Vision or hearing, No problems swallowing food or Liquids, No Chest pain,  No Abdominal pain, No Nausea or Vommitting, Bowel movements are regular, No Blood in stool or Urine, No dysuria, No new skin rashes or bruises, No new joints  pains-aches,  No new weakness, tingling, numbness in any extremity, No recent weight gain or loss, No polyuria, polydypsia or polyphagia, No significant Mental Stressors.  A full 10 point Review of Systems was done, except as stated above, all other Review of Systems were negative.   Social History Social History   Tobacco Use  . Smoking status: Never Smoker  . Smokeless tobacco: Never Used  Substance Use Topics  . Alcohol use: No     Family History Family History  Problem Relation Age of Onset  . Stroke Sister      Prior to Admission medications   Medication Sig Start Date End Date Taking? Authorizing Provider  acetaminophen (TYLENOL) 500 MG tablet Take 1,000 mg by mouth every 6 (six) hours as needed for mild pain.   Yes [provider]  carbidopa-levodopa (SINEMET CR) 50-200 MG tablet TAKE 1 TABLET BY MOUTH AT  BEDTIME 05/17/18  Yes Tat, Rebecca S, DO  Carbidopa-Levodopa ER (SINEMET CR) 25-100 MG tablet  controlled release Take 2 tablets by mouth 4 (four) times daily. 03/16/18  Yes Tat, Octaviano Batty, DO  chlorhexidine (PERIDEX) 0.12 % solution 15 mLs by Mouth Rinse route 2 (two) times daily. Rinse and spit 06/20/18  Yes [provider]  Cholecalciferol (VITAMIN D PO) Take by mouth.     Yes [provider]  clonazePAM (KLONOPIN) 0.5 MG tablet TAKE 1 TABLET BY MOUTH AT  BEDTIME 01/10/18  Yes Tat, Octaviano Batty, DO  Coenzyme Q10 (CO Q 10) 100 MG CAPS Take 200 mg by mouth at bedtime.    Yes [provider]  donepezil (ARICEPT) 10 MG tablet TAKE 1 TABLET BY MOUTH AT  BEDTIME 07/05/18  Yes Tat, Rebecca S, DO  finasteride (PROSCAR) 5 MG tablet Take 5 mg by mouth daily.     Yes [provider]  Flaxseed, Linseed, (FLAX SEED OIL PO) Take by mouth 2 (two) times daily.     Yes [provider]  fludrocortisone (FLORINEF) 0.1 MG tablet Take 0.2 mg by mouth daily.  02/15/18  Yes [provider]  guaiFENesin (MUCINEX) 600 MG 12 hr tablet  Take 600 mg by mouth 2 (two) times daily.   Yes [provider]  ibandronate (BONIVA) 150 MG tablet Take 150 mg by mouth every 30 (thirty) days. Take in the morning with a full glass of water, on an empty stomach, and do not take anything else by mouth or lie down for the next 30 min.   Yes [provider]  MAGNESIUM PO Take 400 mg by mouth at bedtime.    Yes [provider]  Multiple Vitamins-Minerals (ICAPS AREDS FORMULA PO) Take 1 capsule by mouth 2 (two) times daily.   Yes [provider]  Multiple Vitamins-Minerals (MULTIVITAMIN PO) Take 1 tablet by mouth daily.   Yes [provider]  Probiotic Product (ALIGN PO) Take 1 tablet by mouth daily.   Yes [provider]  TURMERIC PO Take 1,000 mg by mouth at bedtime.    Yes [provider]  levofloxacin (LEVAQUIN) 500 MG tablet Take 500 mg by mouth daily. Finished regimen on 07-07-18 06/30/18   [provider]    No Known Allergies  Physical Exam  Vitals  Blood pressure (!) 192/90, pulse 67, temperature 97.7 F (36.5 C), temperature source Oral, resp. rate (!) 23, SpO2 94 %.   1. General chronically ill male, looks tired.  2. Normal affect and insight, Not Suicidal or Homicidal, Awake Alert, Oriented X 3.  3. No F.N deficits, grossly, patient moving all extremities.  4. Ears and Eyes appear Normal, Conjunctivae clear, PERRLA. Moist Oral Mucosa.  5. Supple Neck, No JVD, No cervical lymphadenopathy appriciated, No Carotid Bruits.  6. Symmetrical Chest wall movement, mild bilateral crackles  7. RRR, No Gallops, Rubs or Murmurs, No Parasternal Heave.  8. Positive Bowel Sounds, Abdomen Soft, Non tender, No organomegaly appriciated,No rebound -guarding or rigidity.  9.  No Cyanosis, Normal Skin Turgor, No Skin Rash or Bruise.  10. Good muscle tone, lower extremity edema +2    Data Review  CBC Recent Labs  Lab 07/09/18 1259  WBC 6.9  HGB 13.3  HCT 44.8   PLT 251  MCV 101.4*  MCH 30.1  MCHC 29.7*  RDW 14.5  LYMPHSABS 1.0  MONOABS 0.5  EOSABS 0.0  BASOSABS 0.0   ------------------------------------------------------------------------------------------------------------------  Chemistries  Recent Labs  Lab 07/09/18 1259  NA 142  K 3.4*  CL 107  CO2 27  GLUCOSE 126*  BUN 22  CREATININE 1.06  CALCIUM 9.1   ------------------------------------------------------------------------------------------------------------------ CrCl cannot be calculated (Unknown ideal weight.). ------------------------------------------------------------------------------------------------------------------ No results for input(s): TSH, T4TOTAL, T3FREE, THYROIDAB in the last 72 hours.  Invalid input(s): FREET3   Coagulation profile No results for input(s): INR, PROTIME in the last 168 hours. ------------------------------------------------------------------------------------------------------------------- No results for input(s): DDIMER in the last 72 hours. -------------------------------------------------------------------------------------------------------------------  Cardiac Enzymes No results for input(s): CKMB, TROPONINI, MYOGLOBIN in the last 168 hours.  Invalid input(s): CK ------------------------------------------------------------------------------------------------------------------ Invalid input(s): POCBNP   ---------------------------------------------------------------------------------------------------------------  Urinalysis No results found for: COLORURINE, APPEARANCEUR, LABSPEC, PHURINE, GLUCOSEU, HGBUR, BILIRUBINUR, KETONESUR, PROTEINUR, UROBILINOGEN, NITRITE, LEUKOCYTESUR  ----------------------------------------------------------------------------------------------------------------   Imaging results:   Dg Chest 2 View  Result Date: 07/09/2018 CLINICAL DATA:  Shortness of breath. EXAM: CHEST - 2 VIEW  COMPARISON:  None. FINDINGS: Mild cardiomegaly is noted. No pneumothorax is noted. Mild left basilar atelectasis or infiltrate is noted with mild associated pleural effusion. Right lung is unremarkable. Bony thorax is unremarkable. IMPRESSION: Mild left basilar atelectasis or infiltrate is noted with mild left pleural effusion. Electronically Signed   By: Lupita Raider, M.D.   On: 07/09/2018 14:43    My personal review of EKG: Left bundle branch block, normal sinus rhythm at 83 bpm  Assessment & Plan  Congestive heart failure/fluid overload Lasix IV Check echocardiogram Serial troponins EKG in a.m.  Partially treated CAP, failed outpatient therapy with Levaquin Rocephin/Zithromax  Left bundle branch block continue with telemetry  History of Parkinson's disease and dementia Continue with Sinemet  Hypertension, not on treatment,  Uncontrolled Will observe with IV Lasix  Hyperlipidemia Not on treatment    DVT Prophylaxis Lovenox  AM Labs Ordered, also please review Full Orders  Family Communication: Admission, patients condition and plan of care including tests being ordered have been discussed with the patient and wife who indicate understanding and agree with the plan and Code Status.  Code Status full  Disposition Plan: Home with home health  Time spent in minutes : 46 minutes  Condition GUARDED   @SIGNATURE @

## 2018-07-09 NOTE — Progress Notes (Signed)
CRITICAL VALUE ALERT  Critical Value:  Troponin 0.06  Date & Time Notied:  07/09/18; 755pm  Provider Notified: Caleb Popp, MD  Orders Received/Actions taken:  No new orders. Will continue to monitor.

## 2018-07-09 NOTE — ED Provider Notes (Signed)
[facility] EMERGENCY DEPARTMENT Provider Note   CSN: [id number] Arrival date & time: 07/09/18  1206     History   Chief Complaint Chief Complaint  Patient presents with  . Pneumonia    HPI Randy Murphy is a 84 y.o. male.  Patient is an 84 year old male with past medical history of Parkinson's dementia, hypertension.  He presents for evaluation of difficulty breathing.  According to the wife, he was told he had pneumonia by his primary doctor 2 weeks ago.  He was treated with Levaquin, however has not improved significantly.  She states that today he had noisy breathing and wife brings him for evaluation of this.  There have been no fevers or chills.  Patient history is limited secondary to dementia, however he denies to me he is experiencing any discomfort or symptoms at present.  Wife has also noticed some swelling of both lower extremities.  The history is provided by the patient.  Shortness of Breath  This is a new problem. The average episode lasts 2 weeks. The problem occurs continuously.The problem has not changed since onset.Associated symptoms include wheezing and leg swelling. Pertinent negatives include no fever and no cough. Treatments tried: Levaquin. The treatment provided no relief.    Past Medical History:  Diagnosis Date  . Fatigue   . Hearing loss of both ears   . History of orthostatic hypotension   . Hyperlipidemia   . Hypersomnia, periodic 02/18/2014  . Hypertension   . Insomnia   . Osteopenia   . Parkinson's disease    Possible early parkinsons disease    Patient Active Problem List   Diagnosis Date Noted  . Hypersomnia, periodic 02/18/2014  . 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 H/O viral encephalitis 02/18/2014  . Hearing loss of both ears   . Orthostatic hypotension 05/27/2011    Past Surgical History:  Procedure Laterality Date  . CATARACT EXTRACTION, BILATERAL      . NASAL SEPTUM SURGERY  1972        Home Medications    Prior to Admission medications   Medication Sig Start Date End Date Taking? Authorizing Provider  carbidopa-levodopa (SINEMET CR) 50-200 MG tablet TAKE 1 TABLET BY MOUTH AT  BEDTIME 05/17/18   Tat, Rebecca S, DO  Carbidopa-Levodopa ER (SINEMET CR) 25-100 MG tablet controlled release Take 2 tablets by mouth 4 (four) times daily. 03/16/18   Tat, Octaviano Batty, DO  Cholecalciferol (VITAMIN D PO) Take by mouth.      [provider]  clonazePAM (KLONOPIN) 0.5 MG tablet TAKE 1 TABLET BY MOUTH AT  BEDTIME 01/10/18   Tat, Octaviano Batty, DO  Coenzyme Q10 (CO Q 10 PO) Take by mouth daily.      [provider]  donepezil (ARICEPT) 10 MG tablet TAKE 1 TABLET BY MOUTH AT  BEDTIME 07/05/18   Tat, Octaviano Batty, DO  finasteride (PROSCAR) 5 MG tablet Take 5 mg by mouth daily.      [provider]  Flaxseed, Linseed, (FLAX SEED OIL PO) Take by mouth 2 (two) times daily.      [provider]  fludrocortisone (FLORINEF) 0.1 MG tablet Take 0.1 mcg by mouth 2 (two) times daily. 02/15/18   [provider]  ibandronate (BONIVA) 150 MG tablet Take 150 mg by mouth every 30 (thirty) days. Take in the morning with a full glass of water, on an empty stomach, and do not take anything else by mouth or  lie down for the next 30 min.    [provider]  losartan (COZAAR) 25 MG tablet Take 25 mg by mouth daily. 1/2 tablet every evening    [provider]  MAGNESIUM PO Take by mouth daily.      [provider]  Multiple Vitamins-Minerals (ICAPS AREDS FORMULA PO) Take 1 capsule by mouth 2 (two) times daily.    [provider]  Multiple Vitamins-Minerals (MULTIVITAMIN PO) Take 1 tablet by mouth daily.    [provider]  Probiotic Product (ALIGN PO) Take 1 tablet by mouth daily.    [provider]  TURMERIC PO Take by mouth.    [provider]    Family History Family History   Problem Relation Age of Onset  . Stroke Sister     Social History Social History   Tobacco Use  . Smoking status: Never Smoker  . Smokeless tobacco: Never Used  Substance Use Topics  . Alcohol use: No  . Drug use: No     Allergies   Patient has no known allergies.   Review of Systems Review of Systems  Unable to perform ROS: Dementia  Constitutional: Negative for fever.  Respiratory: Positive for shortness of breath and wheezing. Negative for cough.   Cardiovascular: Positive for leg swelling.     Physical Exam Updated Vital Signs BP (!) 147/93 (BP Location: Right Arm)   Pulse 81   Temp 97.7 F (36.5 C) (Oral)   Resp 18   SpO2 92%   Physical Exam  Constitutional: He is oriented to person, place, and time. He appears well-developed and well-nourished. No distress.  HENT:  Head: Normocephalic and atraumatic.  Mouth/Throat: Oropharynx is clear and moist.  Neck: Normal range of motion. Neck supple.  Cardiovascular: Normal rate and regular rhythm. Exam reveals no friction rub.  No murmur heard. Pulmonary/Chest: Effort normal. No respiratory distress. He has no wheezes. He has rales.  There are slight rales in the bases bilaterally.  Abdominal: Soft. Bowel sounds are normal. He exhibits no distension. There is no tenderness.  Musculoskeletal: Normal range of motion. He exhibits edema.  There is 2+ pitting edema of both lower extremities.  Neurological: He is alert and oriented to person, place, and time. Coordination normal.  Skin: Skin is warm and dry. He is not diaphoretic.  Nursing note and vitals reviewed.    ED Treatments / Results  Labs (all labs ordered are listed, but only abnormal results are displayed) Labs Reviewed  BASIC METABOLIC PANEL  CBC WITH DIFFERENTIAL/PLATELET  BRAIN NATRIURETIC PEPTIDE  I-STAT TROPONIN, ED    EKG EKG Interpretation  Date/Time:  Sunday July 09 2018 12:19:37 EST Ventricular Rate:  83 PR Interval:    QRS  Duration: 166 QT Interval:  458 QTC Calculation: 539 R Axis:   -77 Text Interpretation:  Sinus rhythm Borderline prolonged PR interval Left bundle branch block Confirmed by Geoffery Lyons ([id number]) on 07/09/2018 12:58:46 PM   Radiology No results found.  Procedures Procedures (including critical care time)  Medications Ordered in ED Medications - No data to display   Initial Impression / Assessment and Plan / ED Course  I have reviewed the triage vital signs and the nursing notes.  Pertinent labs & imaging results that were available during my care of the patient were reviewed by me and considered in my medical decision making (see chart for details).  Patient with a 2-week history of dyspnea.  He was diagnosed with pneumonia by his PCP  and treated with Levaquin, however has not improved.  His breathing is worsened over the past 2 days and is hypoxic upon presentation with saturations in the upper 80s.  Chest x-ray shows what appears to be an infiltrate in the left base.  He also has an elevated BNP and bilateral lower extremity edema.  I am uncertain as to whether his dyspnea is CHF related or pneumonia, but has been treated for both.  He was given IV Lasix and Rocephin and Zithromax.  I have discussed with the hospitalist who will admit.  Final Clinical Impressions(s) / ED Diagnoses   Final diagnoses:  None    ED Discharge Orders    None       Geoffery Lyons, MD 07/09/18 1527

## 2018-07-09 NOTE — ED Triage Notes (Signed)
Pt dropped by Wife at front door . Wife reported to staff Pt has PNA. Unsure how DX was made . Waiting for wife.

## 2018-07-10 ENCOUNTER — Inpatient Hospital Stay (HOSPITAL_COMMUNITY): Payer: Medicare Other

## 2018-07-10 ENCOUNTER — Other Ambulatory Visit: Payer: Self-pay

## 2018-07-10 ENCOUNTER — Encounter (HOSPITAL_COMMUNITY): Payer: Self-pay | Admitting: Physician Assistant

## 2018-07-10 DIAGNOSIS — I340 Nonrheumatic mitral (valve) insufficiency: Secondary | ICD-10-CM

## 2018-07-10 DIAGNOSIS — I351 Nonrheumatic aortic (valve) insufficiency: Secondary | ICD-10-CM

## 2018-07-10 DIAGNOSIS — I447 Left bundle-branch block, unspecified: Secondary | ICD-10-CM

## 2018-07-10 DIAGNOSIS — I5021 Acute systolic (congestive) heart failure: Secondary | ICD-10-CM

## 2018-07-10 DIAGNOSIS — G20 Parkinson's disease: Secondary | ICD-10-CM

## 2018-07-10 DIAGNOSIS — M7989 Other specified soft tissue disorders: Secondary | ICD-10-CM

## 2018-07-10 LAB — BASIC METABOLIC PANEL
Anion gap: 8 (ref 5–15)
BUN: 21 mg/dL (ref 8–23)
CO2: 28 mmol/L (ref 22–32)
Calcium: 8.5 mg/dL — ABNORMAL LOW (ref 8.9–10.3)
Chloride: 109 mmol/L (ref 98–111)
Creatinine, Ser: 1.23 mg/dL (ref 0.61–1.24)
GFR calc Af Amer: >60 mL/min (ref >60)
GFR calc non Af Amer: 52 mL/min — ABNORMAL LOW (ref >60)
Glucose, Bld: 87 mg/dL (ref 70–99)
Potassium: 2.9 mmol/L — ABNORMAL LOW (ref 3.5–5.1)
Sodium: 145 mmol/L (ref 135–145)

## 2018-07-10 LAB — ECHOCARDIOGRAM COMPLETE
Height: 71 in
Weight: 3040.58 oz

## 2018-07-10 LAB — TROPONIN I: Troponin I: 0.08 ng/mL (ref <0.03)

## 2018-07-10 LAB — MAGNESIUM: Magnesium: 2 mg/dL (ref 1.7–2.4)

## 2018-07-10 MED ORDER — FLUDROCORTISONE ACETATE 0.1 MG PO TABS
0.1000 mg | ORAL_TABLET | Freq: Every day | ORAL | Status: AC
  Administered 2018-07-11 – 2018-07-12 (×2): 0.1 mg via ORAL
  Filled 2018-07-10 (×2): qty 1

## 2018-07-10 MED ORDER — POTASSIUM CHLORIDE CRYS ER 20 MEQ PO TBCR
40.0000 meq | EXTENDED_RELEASE_TABLET | ORAL | Status: AC
  Administered 2018-07-10 (×2): 40 meq via ORAL
  Filled 2018-07-10 (×2): qty 2

## 2018-07-10 NOTE — Progress Notes (Signed)
PHARMACIST - PHYSICIAN COMMUNICATION  CONCERNING: P&T Medication Policy Regarding Oral Bisphosphonates  RECOMMENDATION: Your order for ibandronate (Boniva) has been discontinued at this time.  If the patient's post-hospital medical condition warrants safe use of this class of drugs, please resume the pre-hospital regimen upon discharge.  DESCRIPTION:  Alendronate (Fosamax), ibandronate (Boniva), and risedronate (Actonel) can cause severe esophageal erosions in patients who are unable to remain upright at least 30 minutes after taking this medication.   Since brief interruptions in therapy are thought to have minimal impact on bone mineral density, the Pharmacy & Therapeutics Committee has established that bisphosphonate orders should be routinely discontinued during hospitalization.   To override this safety policy and permit administration of Boniva, Fosamax, or Actonel in the hospital, prescribers must write "DO NOT HOLD" in the comments section when placing the order for this class of medications.  Jenetta Downer, [facility] Clinical Pharmacist Phone [phone]  07/10/2018 10:37 AM

## 2018-07-10 NOTE — Progress Notes (Signed)
Patient ID: Randy Murphy, male   DOB: 25-Feb-1932, 84 y.o.   MRN: [id number]  PROGRESS NOTE    Randy Murphy  WUJ:[id number] DOB: [birth year] DOA: 07/09/2018 PCP: Rodrigo Ran, MD   Brief Narrative:  84 year old male with history of dementia, Parkinson's disease, hypertension presented with worsening cough with fever and chills despite being treated with Levaquin for a week by his primary care provider.  He was admitted for CHF exacerbation along with pneumonia and started on IV antibiotics and Lasix.   Assessment & Plan:   Active Problems:   Congestive heart failure (CHF) (HCC)  Acute probably diastolic heart failure exacerbation -Patient does not have a history of heart failure -2D echo.  Strict input and output.  Daily weights.  Continue intravenous Lasix.  Will get lower extremity duplex to rule out DVT -Consult cardiology   Mildly positive troponins -Probably secondary to above.  No current chest pain.  Follow cardiology recommendations  Recent treatment for community acquired pneumonia -Patient was treated with Levaquin for a week by primary care provider recently.  Doubt that he still has bacterial pneumonia but will continue Rocephin and Zithromax for now.  Hypokalemia Replace.  Repeat a.m. labs  History of Parkinson's disease and dementia -Continue Sinemet  Generalized deconditioning -PT eval  DVT prophylaxis: Lovenox Code Status: Full Family Communication: Spoke to wife at bedside Disposition Plan: Depends on clinical outcome  Consultants: Cardiology  Procedures: None  Antimicrobials: Rocephin and Zithromax from 07/09/2018 onwards   Subjective: Patient seen and examined at bedside.  Wife present at bedside.  She denies any worsening chest pain, cough or shortness of breath.  Feels weak and tired.  Objective: Vitals:   07/10/18 0018 07/10/18 0437 07/10/18 0801 07/10/18 1213  BP: (!) 161/90 (!) 163/79 (!) 165/86 (!) 155/86  Pulse: 63 72 73 83  Resp: 20 18  (!) 22 (!) 22  Temp: 98 F (36.7 C) 98.1 F (36.7 C) 98.6 F (37 C) 98.4 F (36.9 C)  TempSrc: Oral Oral Oral Oral  SpO2: 92% 94% 96% 96%  Weight:  86.2 kg    Height:        Intake/Output Summary (Last 24 hours) at 07/10/2018 1338 Last data filed at 07/10/2018 1222 Gross per 24 hour  Intake 830 ml  Output 2650 ml  Net -1820 ml   Filed Weights   07/09/18 1740 07/10/18 0437  Weight: 87.1 kg 86.2 kg    Examination:  General exam: Elderly male lying in bed, no distress.  Speech is slightly slurred, wife states that this happens when patient gets ill. Respiratory system: Bilateral decreased breath sounds at bases with some scattered crackles Cardiovascular system: S1 & S2 heard, Rate controlled Gastrointestinal system: Abdomen is nondistended, soft and nontender. Normal bowel sounds heard. Extremities: No cyanosis, clubbing; bilateral pitting edema present, more on the left side   Data Reviewed: I have personally reviewed following labs and imaging studies  CBC: Recent Labs  Lab 07/09/18 1259  WBC 6.9  NEUTROABS 5.3  HGB 13.3  HCT 44.8  MCV 101.4*  PLT 251   Basic Metabolic Panel: Recent Labs  Lab 07/09/18 1259 07/10/18 0724  NA 142 145  K 3.4* 2.9*  CL 107 109  CO2 27 28  GLUCOSE 126* 87  BUN 22 21  CREATININE 1.06 1.23  CALCIUM 9.1 8.5*  MG  --  2.0   GFR: Estimated Creatinine Clearance: 47.6 mL/min (by C-G formula based on SCr of 1.23 mg/dL). Liver Function Tests: No results  for input(s): AST, ALT, ALKPHOS, BILITOT, PROT, ALBUMIN in the last 168 hours. No results for input(s): LIPASE, AMYLASE in the last 168 hours. No results for input(s): AMMONIA in the last 168 hours. Coagulation Profile: No results for input(s): INR, PROTIME in the last 168 hours. Cardiac Enzymes: Recent Labs  Lab 07/09/18 1826 07/09/18 2025 07/10/18 0724  TROPONINI 0.06* 0.08* 0.08*   BNP (last 3 results) No results for input(s): PROBNP in the last 8760  hours. HbA1C: No results for input(s): HGBA1C in the last 72 hours. CBG: No results for input(s): GLUCAP in the last 168 hours. Lipid Profile: No results for input(s): CHOL, HDL, LDLCALC, TRIG, CHOLHDL, LDLDIRECT in the last 72 hours. Thyroid Function Tests: Recent Labs    07/09/18 1827  TSH 3.757   Anemia Panel: No results for input(s): VITAMINB12, FOLATE, FERRITIN, TIBC, IRON, RETICCTPCT in the last 72 hours. Sepsis Labs: No results for input(s): PROCALCITON, LATICACIDVEN in the last 168 hours.  Recent Results (from the past 240 hour(s))  Culture, blood (routine x 2) Call MD if unable to obtain prior to antibiotics being given     Status: None (Preliminary result)   Collection Time: 07/09/18  6:15 PM  Result Value Ref Range Status   Specimen Description BLOOD RIGHT ANTECUBITAL  Final   Special Requests   Final    BOTTLES DRAWN AEROBIC AND ANAEROBIC Blood Culture adequate volume   Culture   Final    NO GROWTH < 24 HOURS Performed at [facility], [address], [place], [place] [address]    Report Status PENDING  Incomplete  Culture, blood (routine x 2) Call MD if unable to obtain prior to antibiotics being given     Status: None (Preliminary result)   Collection Time: 07/09/18  6:30 PM  Result Value Ref Range Status   Specimen Description BLOOD RIGHT HAND  Final   Special Requests   Final    BOTTLES DRAWN AEROBIC AND ANAEROBIC Blood Culture adequate volume   Culture   Final    NO GROWTH < 24 HOURS Performed at [facility], [address], [place], [place] [address]    Report Status PENDING  Incomplete         Radiology Studies: Dg Chest 2 View  Result Date: 07/09/2018 CLINICAL DATA:  Shortness of breath. EXAM: CHEST - 2 VIEW COMPARISON:  None. FINDINGS: Mild cardiomegaly is noted. No pneumothorax is noted. Mild left basilar atelectasis or infiltrate is noted with mild associated pleural effusion. Right lung is unremarkable. Bony thorax is  unremarkable. IMPRESSION: Mild left basilar atelectasis or infiltrate is noted with mild left pleural effusion. Electronically Signed   By: Lupita Raider, M.D.   On: 07/09/2018 14:43        Scheduled Meds: . azithromycin  500 mg Oral Q24H  . carbidopa-levodopa  1 tablet Oral QHS  . Carbidopa-Levodopa ER  2 tablet Oral 4 times per day  . clonazePAM  0.5 mg Oral QHS  . enoxaparin (LOVENOX) injection  30 mg Subcutaneous Q24H  . finasteride  5 mg Oral Daily  . fludrocortisone  0.2 mg Oral Daily  . furosemide  40 mg Intravenous BID  . sodium chloride flush  3 mL Intravenous Q12H   Continuous Infusions: . sodium chloride    . cefTRIAXone (ROCEPHIN)  IV       LOS: 1 day        Glade Lloyd, MD [facility] Pager [phone]  If 7PM-7AM, please contact  night-coverage [email or web address] Password [email or web address] 07/10/2018, 1:38 PM

## 2018-07-10 NOTE — Plan of Care (Signed)
?  Problem: Education: ?Goal: Ability to demonstrate management of disease process will improve ?Outcome: Progressing ?  ?Problem: Education: ?Goal: Ability to verbalize understanding of medication therapies will improve ?Outcome: Progressing ?  ?Problem: Cardiac: ?Goal: Ability to achieve and maintain adequate cardiopulmonary perfusion will improve ?Outcome: Progressing ?  ?

## 2018-07-10 NOTE — Progress Notes (Signed)
  Echocardiogram 2D Echocardiogram has been performed.  Randy Murphy 07/10/2018, 10:00 AM

## 2018-07-10 NOTE — Progress Notes (Signed)
Bilateral lower extremity venous duplex has been completed. Negative for DVT.  07/10/18 3:00 PM Olen Cordial RVT

## 2018-07-10 NOTE — Evaluation (Signed)
Physical Therapy Evaluation Patient Details Name: Randy Murphy MRN: [id number] DOB: 10/11/1931 Today's Date: 07/10/2018   History of Present Illness  39y male with 2 week history of SOB and cough, fever/chills, and B LE edema. Diagnosed with CHF. PMH HOH, hypersomnia, HTN, PD   Clinical Impression   Patient received in bed with family present, pleasant and willing to participate in PT session but A&O to self only this afternoon, reports increased fatigue in afternoon. He requires MaxA for all functional bed mobility and has strong posterior lean at EOB requiring ModA to maintain upright; at one point able to briefly maintain upright and attempted sit to stand with maxAx1 but unable to complete transfer safely. Deferred progression of mobility today due to safety concerns secondary to need for high level of assist. He was left in bed with all needs met, bed alarm active, and family present. He will continue to benefit from skilled PT services in the acute setting as well as ongoing skilled services in the ST-SNF setting moving forward.     Follow Up Recommendations SNF;Supervision/Assistance - 24 hour    Equipment Recommendations  Other (comment);None recommended by PT(defer to next venue )    Recommendations for Other Services       Precautions / Restrictions Precautions Precautions: Fall;Other (comment) Precaution Comments: watch SpO2  Restrictions Weight Bearing Restrictions: No      Mobility  Bed Mobility Overal bed mobility: Needs Assistance Bed Mobility: Supine to Sit;Sit to Supine     Supine to sit: Max assist Sit to supine: Max assist   General bed mobility comments: MaxA for all bed mobility, strong posterior lean at EOB with ModA to maintain upright; attempted sit to stand but unable safely with assist of just +1   Transfers                 General transfer comment: deferred  Ambulation/Gait             General Gait Details: deferred  Stairs             Wheelchair Mobility    Modified Rankin (Stroke Patients Only)       Balance Overall balance assessment: Needs assistance Sitting-balance support: Bilateral upper extremity supported;Feet supported Sitting balance-Leahy Scale: Poor     Standing balance support: Bilateral upper extremity supported;During functional activity Standing balance-Leahy Scale: Zero                               Pertinent Vitals/Pain Pain Assessment: No/denies pain    Home Living Family/patient expects to be discharged to:: Private residence Living Arrangements: Spouse/significant other Available Help at Discharge: Personal care attendant;Family;Available 24 hours/day Type of Home: House Home Access: Stairs to enter Entrance Stairs-Rails: Can reach both Entrance Stairs-Number of Steps: 6 Home Layout: One level Home Equipment: Walker - 2 wheels;Transport chair      Prior Function Level of Independence: Needs assistance   Gait / Transfers Assistance Needed: significant amounts of assistance for transfers   ADL's / Homemaking Assistance Needed: totalA         Hand Dominance        Extremity/Trunk Assessment   Upper Extremity Assessment Upper Extremity Assessment: Defer to OT evaluation    Lower Extremity Assessment Lower Extremity Assessment: Generalized weakness    Cervical / Trunk Assessment Cervical / Trunk Assessment: Kyphotic  Communication   Communication: HOH  Cognition Arousal/Alertness: Awake/alert Behavior During Therapy: [facility] for  tasks assessed/performed Overall Cognitive Status: History of cognitive impairments - at baseline                                 General Comments: history of dementia at baseline; A&Ox1 this afternoon, fatigued. Able to follow simple commands inconsistently       General Comments General comments (skin integrity, edema, etc.): SpO2 fluctuating between 88-91% during session after nasal cannula were  replaced on his nose, had dropped to 84% when he was in bed with them off     Exercises     Assessment/Plan    PT Assessment Patient needs continued PT services  PT Problem List Decreased strength;Decreased mobility;Decreased safety awareness;Decreased coordination;Decreased activity tolerance;Cardiopulmonary status limiting activity;Decreased balance       PT Treatment Interventions DME instruction;Therapeutic activities;Gait training;Therapeutic exercise;Patient/family education;Stair training;Balance training;Functional mobility training;Neuromuscular re-education    PT Goals (Current goals can be found in the Care Plan section)  Acute Rehab PT Goals Patient Stated Goal: get stronger, get rehab  PT Goal Formulation: With family Time For Goal Achievement: 07/24/18 Potential to Achieve Goals: Good    Frequency Min 2X/week   Barriers to discharge        Co-evaluation               AM-PAC PT "6 Clicks" Daily Activity  Outcome Measure Difficulty turning over in bed (including adjusting bedclothes, sheets and blankets)?: Unable Difficulty moving from lying on back to sitting on the side of the bed? : Unable Difficulty sitting down on and standing up from a chair with arms (e.g., wheelchair, bedside commode, etc,.)?: Unable Help needed moving to and from a bed to chair (including a wheelchair)?: Total Help needed walking in hospital room?: Total Help needed climbing 3-5 steps with a railing? : Total 6 Click Score: 6    End of Session Equipment Utilized During Treatment: Gait belt Activity Tolerance: Patient tolerated treatment well;Patient limited by fatigue Patient left: in bed;with bed alarm set;with family/visitor present;with call bell/phone within reach   PT Visit Diagnosis: Unsteadiness on feet (R26.81);Muscle weakness (generalized) (M62.81);Difficulty in walking, not elsewhere classified (R26.2);History of falling (Z91.81)    Time: 1443-1540 PT Time  Calculation (min) (ACUTE ONLY): 23 min   Charges:   PT Evaluation $PT Eval Moderate Complexity: 1 Mod PT Treatments $Therapeutic Activity: 8-22 mins        Deniece Ree PT, DPT, CBIS  Supplemental Physical Therapist [place]    Pager [phone] Acute Rehab Office [phone]

## 2018-07-10 NOTE — Progress Notes (Signed)
CCMD notified this RN of an irregular rhythm that was seen on the patient at around 7:30pm last night at around 1:00am this AM. Vitals and EKG on file. MD on call notified. Will continue to monitor.

## 2018-07-10 NOTE — Consult Note (Addendum)
Cardiology Consultation:   Patient ID: Randy Murphy; [id number]; [birth year]   Admit date: 07/09/2018 Date of Consult: 07/10/2018  Primary Care Provider: Rodrigo Ran, MD Primary Cardiologist: Dr. Elease Hashimoto remotely. Now being seen by Dr. Anne Fu. Primary Electrophysiologist:  None  Chief Complaint: shortness of breath, weakness  Patient Profile:   Randy Murphy is a 84 y.o. 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 Hanley Ben.  History of Present Illness:   Randy Murphy has dementia and is not really able to meaningfully contribute to the conversation. He thinks its 1900-something and cannot say where he is. He does know his name. History is therefore limited to collection from chart and wife at bedside. He has a history of normal nuc in 2011 with EF 73%. He remotely saw Dr. Elease Hashimoto in 2012 for orthostatic hypotension felt related to Parkinson's. There does not appear to be an EKG done at that visit. He is on Florinef and it's not clear when this was started. Wife thinks Dr. Waynard Edwards prescribed it about a year ago but it was not listed on neuro MAR in 02/2018. She denies any recent med changes so does not think it is a recent addition.  His wife says over the last month he's really declined. He used to be able to walk in the park but has now been much weaker and spending all of his time seated. His appetite is not robust. In September she notified primary care of some daytime wheezing that was managed conservatively. In late October he developed SOB, cough and nocturnal wheezing. She describes the SOB as though he cannot really put any power behind his voice when he talked and pausing between words. H/P notes fevers and chills but wife denies this to me. He was treated with a course of  Levaquin by PCP and wife initially saw improvement. He finished this just a few days ago. However, symptoms rebounded and she also noticed some increasing bilateral lower extremity edema so they came to the ER. CXR shows mild left basilar atelectasis or infiltrate is noted with mild left pleural effusion. He has been hypertensive on labs and intermittently hypoxic on RA requiring Assumption supplementation. Labs show normal WBC and Hgb, K 3.4->2.9, Cr up to 1.22 similar to prior, troponin 0.06-0.08-0.08, BNP 752, TSH wnl, strep pneumo neg, blood cx neg thus far. 2D echo pending. Temp has been normal.  Weight does appear to be up about 8lb since 09/2017 (182 in 09/2017, 192 on adm, 190 today). He was started on IV Lasix with -1.8L out thus far. EKG shows NSR with LBBB of unknown chronicity. Telemetry shows rare nonsustained junctional beating with HR in the upper 40s. Telemetry yesterday evening also demonstrates a persistent irregular rhythm, difficult to discern if atrial fib, rate controlled. This was not captured on EKG. LE venous duplex and echo are pending. He is on azithromycin and ceftriaxone.   Past Medical History:  Diagnosis Date  . Fatigue   . Hearing loss of both ears   . History of orthostatic hypotension   . Hyperlipidemia   . Hypersomnia, periodic 02/18/2014  . Hypertension   . Insomnia   . Osteopenia   . Parkinson's disease    Possible early parkinsons disease    Past Surgical History:  Procedure Laterality Date  .  CATARACT EXTRACTION, BILATERAL    . NASAL SEPTUM SURGERY  1972     Inpatient Medications: Scheduled Meds: . azithromycin  500 mg Oral Q24H  . carbidopa-levodopa  1 tablet Oral QHS  . Carbidopa-Levodopa ER  2 tablet Oral 4 times per day  . clonazePAM  0.5 mg Oral QHS  . enoxaparin (LOVENOX) injection  30 mg Subcutaneous Q24H  . finasteride  5 mg Oral Daily  . fludrocortisone  0.2 mg Oral Daily  . furosemide  40 mg Intravenous BID  . potassium chloride  40 mEq Oral  Q4H  . sodium chloride flush  3 mL Intravenous Q12H   Continuous Infusions: . sodium chloride    . cefTRIAXone (ROCEPHIN)  IV     PRN Meds: sodium chloride, acetaminophen **OR** acetaminophen, hydrALAZINE, HYDROcodone-acetaminophen, ondansetron **OR** ondansetron (ZOFRAN) IV, sodium chloride flush  Home Meds: Prior to Admission medications   Medication Sig Start Date End Date Taking? Authorizing Provider  acetaminophen (TYLENOL) 500 MG tablet Take 1,000 mg by mouth every 6 (six) hours as needed for mild pain.   Yes [provider]  carbidopa-levodopa (SINEMET CR) 50-200 MG tablet TAKE 1 TABLET BY MOUTH AT  BEDTIME 05/17/18  Yes Tat, Rebecca S, DO  Carbidopa-Levodopa ER (SINEMET CR) 25-100 MG tablet controlled release Take 2 tablets by mouth 4 (four) times daily. 03/16/18  Yes Tat, Octaviano Batty, DO  chlorhexidine (PERIDEX) 0.12 % solution 15 mLs by Mouth Rinse route 2 (two) times daily. Rinse and spit 06/20/18  Yes [provider]  Cholecalciferol (VITAMIN D PO) Take by mouth.     Yes [provider]  clonazePAM (KLONOPIN) 0.5 MG tablet TAKE 1 TABLET BY MOUTH AT  BEDTIME 01/10/18  Yes Tat, Octaviano Batty, DO  Coenzyme Q10 (CO Q 10) 100 MG CAPS Take 200 mg by mouth at bedtime.    Yes [provider]  donepezil (ARICEPT) 10 MG tablet TAKE 1 TABLET BY MOUTH AT  BEDTIME 07/05/18  Yes Tat, Rebecca S, DO  finasteride (PROSCAR) 5 MG tablet Take 5 mg by mouth daily.     Yes [provider]  Flaxseed, Linseed, (FLAX SEED OIL PO) Take by mouth 2 (two) times daily.     Yes [provider]  fludrocortisone (FLORINEF) 0.1 MG tablet Take 0.2 mg by mouth daily.  02/15/18  Yes [provider]  guaiFENesin (MUCINEX) 600 MG 12 hr tablet Take 600 mg by mouth 2 (two) times daily.   Yes [provider]  ibandronate (BONIVA) 150 MG tablet Take 150 mg by mouth every 30 (thirty) days. Take in the morning with a full glass of water, on an empty stomach, and  do not take anything else by mouth or lie down for the next 30 min.   Yes [provider]  MAGNESIUM PO Take 400 mg by mouth at bedtime.    Yes [provider]  Multiple Vitamins-Minerals (ICAPS AREDS FORMULA PO) Take 1 capsule by mouth 2 (two) times daily.   Yes [provider]  Multiple Vitamins-Minerals (MULTIVITAMIN PO) Take 1 tablet by mouth daily.   Yes [provider]  Probiotic Product (ALIGN PO) Take 1 tablet by mouth daily.   Yes [provider]  TURMERIC PO Take 1,000 mg by mouth at bedtime.    Yes [provider]  levofloxacin (LEVAQUIN) 500 MG tablet Take 500 mg by mouth daily. Finished regimen on 07-07-18 06/30/18   [provider]    Allergies:   No Known Allergies  Social History:   Social History   Socioeconomic History  . Marital status: Married    Spouse name: [name]  . Number of children: 1  . Years of education: 62  . Highest education level: Not on file  Occupational History  . Occupation: retired    Comment: Dealer - Building surveyor  Social Needs  . Financial resource strain: Not on file  . Food insecurity:    Worry: Not on file    Inability: Not on file  . Transportation needs:    Medical: Not on file    Non-medical: Not on file  Tobacco Use  . Smoking status: Never Smoker  . Smokeless tobacco: Never Used  Substance and Sexual Activity  . Alcohol use: No  . Drug use: No  . Sexual activity: Not on file  Lifestyle  . Physical activity:    Days per week: Not on file    Minutes per session: Not on file  . Stress: Not on file  Relationships  . Social connections:    Talks on phone: Not on file    Gets together: Not on file    Attends religious service: Not on file    Active member of club or organization: Not on file    Attends meetings of clubs or organizations: Not on file    Relationship status: Not on file  . Intimate partner violence:    Fear of current or ex  partner: Not on file    Emotionally abused: Not on file    Physically abused: Not on file    Forced sexual activity: Not on file  Other Topics Concern  . Not on file  Social History Narrative   Patient is married [name]) and lives at home with his wife.   Patient has one adult child.   Patient is retired.   Patient has a college education.   Patient is right-handed.   Patient drinks one cup of coffee daily.    Family History:   The patient's family history includes Stroke in his sister.  ROS:  Please see the history of present illness.  Unable to truly ascertain from patient.  Physical Exam/Data:   Vitals:   07/10/18 0018 07/10/18 0437 07/10/18 0801 07/10/18 1213  BP: (!) 161/90 (!) 163/79 (!) 165/86 (!) 155/86  Pulse: 63 72 73 83  Resp: 20 18 (!) 22 (!) 22  Temp: 98 F (36.7 C) 98.1 F (36.7 C) 98.6 F (37 C) 98.4 F (36.9 C)  TempSrc: Oral Oral Oral Oral  SpO2: 92% 94% 96% 96%  Weight:  86.2 kg    Height:        Intake/Output Summary (Last 24 hours) at 07/10/2018 1406 Last data filed at 07/10/2018 1222 Gross per 24 hour  Intake 830 ml  Output 2650 ml  Net -1820 ml   Filed Weights   07/09/18 1740 07/10/18 0437  Weight: 87.1 kg 86.2 kg   Body mass index is 26.5 kg/m.  General: Frail elderly WM in no acute distress. Head: Normocephalic, atraumatic, sclera non-icteric, no xanthomas, nares are without discharge.  Neck: Negative for carotid bruits. JVD not elevated. Lungs: Decreased BS at bases without wheezes, rales, or rhonchi. Breathing is unlabored. Heart: RRR with S1 S2. No murmurs, rubs, or gallops appreciated. Abdomen: Soft, non-tender, non-distended with normoactive bowel sounds. No hepatomegaly. No rebound/guarding. No obvious abdominal masses. Msk:  Strength and tone appear normal for age. Extremities: No clubbing or cyanosis. 1+ soft pitting BLE edema.  Distal pedal pulses are in tact bilaterally. Neuro: Alert and oriented to self. Low tone of  voice, difficult to discern words. No facial asymmetry. Follows commands. Thinks its 1900 something and does not know where he is Psych:  Flat affect  EKG:  The EKG was personally reviewed and demonstrates NSR with LBBB unknown chronicity, borderline prolonged PR interval  Relevant CV Studies: Pending echo   Laboratory Data:  Chemistry Recent Labs  Lab 07/09/18 1259 07/10/18 0724  NA 142 145  K 3.4* 2.9*  CL 107 109  CO2 27 28  GLUCOSE 126* 87  BUN 22 21  CREATININE 1.06 1.23  CALCIUM 9.1 8.5*  GFRNONAA >60 52*  GFRAA >60 >60  ANIONGAP 8 8    No results for input(s): PROT, ALBUMIN, AST, ALT, ALKPHOS, BILITOT in the last 168 hours. Hematology Recent Labs  Lab 07/09/18 1259  WBC 6.9  RBC 4.42  HGB 13.3  HCT 44.8  MCV 101.4*  MCH 30.1  MCHC 29.7*  RDW 14.5  PLT 251   Cardiac Enzymes Recent Labs  Lab 07/09/18 1826 07/09/18 2025 07/10/18 0724  TROPONINI 0.06* 0.08* 0.08*    Recent Labs  Lab 07/09/18 1309  TROPIPOC 0.03    BNP Recent Labs  Lab 07/09/18 1259  BNP 752.0*    DDimer No results for input(s): DDIMER in the last 168 hours.  Radiology/Studies:  Dg Chest 2 View  Result Date: 07/09/2018 CLINICAL DATA:  Shortness of breath. EXAM: CHEST - 2 VIEW COMPARISON:  None. FINDINGS: Mild cardiomegaly is noted. No pneumothorax is noted. Mild left basilar atelectasis or infiltrate is noted with mild associated pleural effusion. Right lung is unremarkable. Bony thorax is unremarkable. IMPRESSION: Mild left basilar atelectasis or infiltrate is noted with mild left pleural effusion. Electronically Signed   By: Lupita Raider, M.D.   On: 07/09/2018 14:43    Assessment and Plan:   1. Shortness of breath/hypoxia possibly due to CAP and acute CHF (type unknown) - CXR does show possible infiltrate but he is afebrile with normal WBC and was already treated with Levaquin, so suspect there is more than one process at play. He does appear volume overloaded on exam so  I do suspect a component of CHF. BP and Cr appear to be tolerating diuresis at baseline, thus would continue. I suspect he would benefit from tapering of Florinef and consideration of alternative agent such as midodrine if needed. 2D echo and LE venous duplex are pending. IM is repleting potassium. Addendum: 2D echo has just resulted with EF 35-40%, diffuse HK, moderate diastolic dysfunction, severe pulmonary HTN and dilated IVC suggestive of elevated RV filling pressure, mild-mod MR, moderate AI. Would continue diuresis as ordered. Will taper down florinef to 0.1mg  x 2 more days then stop completely. We can add midodrine to regimen if needed. For now, will hold off additional therapies pending assessment of what his BP does with down-titration of florinef. Need to be cautious given h/o frequent falls and orthostasis. Avoid BB for now given intermittent bradycardia.  2. Arrhythmia - ? Possible atrial fib, also rare junctional beats - Yesterday evening (for example 19h-1a) telemetry shows an irregular HR where the p waves are as discernible as on other portions of telemetry. I will ask MD to review. The patient does not seem like he would be a candidate for anticoag regardless given frequent falls and rate is unchanged during these episodes. He currently appears to be in NSR. If tele triggers for irregular HR again  I would suggest obtaining a 12-lead during that time.   3. LBBB - unclear chronicity. Await echo. No syncope reported.  4. Elevated troponin - suspect due to demand process given low/flat trend. Await echocardiogram but given frailty and recent failure to thrive, suspect conservative management is appropriate. He has numerous falls regularly requiring EMS to come out to the house so risk of blood thinner (I.e. Aspirin) would seem to outweigh benefit at this time. Avoid BB given baseline bradycardia. Statin can be considered but would defer to OP setting given his recent weakness issues so as not to  confuse the picture.  5. H/o orthostatic hypotension - pt now spends majority of his time seated. He has been persistently hypertensive here. As above, will discuss d/c of florinef with Dr. Anne Fu. Already received today. Midodrine would be an alternative if needed.  6. Parkinson's disease - question whether he would require eval for aspiration given his Parkinson's disease.  I will defer to IM.  For questions or updates, please contact [facility] Please consult [email or web address] for contact info under Cardiology/STEMI.    Signed, Laurann Montana, PA-C  07/10/2018 2:06 PM   Personally seen and examined. Agree with above.  84 year old male with Parkinson's, autonomic dysfunction, dementia frequent falls with newly discovered dilated cardiomyopathy EF 35% left bundle branch block, has been on Florinef.  Currently he is in bed with mildly increased respiratory effort.  Breathing is not at baseline but it is improved.  No chest pain.  Much of the history is gathered from his wife.  On exam-appears elderly, mildly disheveled, pleasant.  Heart regular rate and rhythm with occasional ectopy.  1/6 systolic murmur heard best at apex.  Crackles heard at lung bases.  Increased respiratory rate.  Increased edema bilateral lower extremities.  Protuberant abdomen.  Echocardiogram-EF 35 to 40% with severely elevated pulmonary pressures.  Mild to moderate MR and moderate AI.  Telemetry-personally reviewed and interpreted- does occasionally have what appear to be junctional beats.  Interventricular conduction delay.  This may be atrial fibrillation but is not conclusive.  Occasionally we will see a P wave interspersed.  Assessment and plan:  Acute systolic heart failure - Tapering off Florinef which is contributing to increased intravascular volume. - Lasix IV continue. - Try to add ACE inhibitor or angiotensin receptor blocker tomorrow as long as creatinine remains fairly stable.  Would likely avoid  Entresto given his history of autonomic dysfunction.  Left bundle branch block - Newly discovered.  Likely secondary to underlying cardiomyopathy.  Continue to monitor.  No evidence of significant pauses on telemetry.  Parkinson's disease with dysautonomia - This is been contributing to his orthostatic hypotension.  He has been on Florinef 0.2 mg once a day at home.  We will taper this back given his newly discovered cardiomyopathy and shortness of breath.  Certainly this could be contributing to his increased edema and increased intravascular volume. - Of course, all of these situations may exacerbate his orthostatic hypotension and this will be a fine balance for him.  Discussed with the family.  Elevated troponin -Demand ischemia.  Donato Schultz, MD

## 2018-07-11 LAB — CBC WITH DIFFERENTIAL/PLATELET
Abs Immature Granulocytes: 0.03 10*3/uL (ref 0.00–0.07)
Basophils Absolute: 0 10*3/uL (ref 0.0–0.1)
Basophils Relative: 1 %
Eosinophils Absolute: 0.1 10*3/uL (ref 0.0–0.5)
Eosinophils Relative: 2 %
HCT: 41.7 % (ref 39.0–52.0)
Hemoglobin: 13.1 g/dL (ref 13.0–17.0)
Immature Granulocytes: 0 %
Lymphocytes Relative: 20 %
Lymphs Abs: 1.4 10*3/uL (ref 0.7–4.0)
MCH: 30.9 pg (ref 26.0–34.0)
MCHC: 31.4 g/dL (ref 30.0–36.0)
MCV: 98.3 fL (ref 80.0–100.0)
Monocytes Absolute: 1 10*3/uL (ref 0.1–1.0)
Monocytes Relative: 14 %
Neutro Abs: 4.6 10*3/uL (ref 1.7–7.7)
Neutrophils Relative %: 63 %
Platelets: 205 10*3/uL (ref 150–400)
RBC: 4.24 MIL/uL (ref 4.22–5.81)
RDW: 14.1 % (ref 11.5–15.5)
WBC: 7.2 10*3/uL (ref 4.0–10.5)
nRBC: 0 % (ref 0.0–0.2)

## 2018-07-11 LAB — BASIC METABOLIC PANEL
Anion gap: 8 (ref 5–15)
BUN: 20 mg/dL (ref 8–23)
CO2: 30 mmol/L (ref 22–32)
Calcium: 8.4 mg/dL — ABNORMAL LOW (ref 8.9–10.3)
Chloride: 103 mmol/L (ref 98–111)
Creatinine, Ser: 1.42 mg/dL — ABNORMAL HIGH (ref 0.61–1.24)
GFR calc Af Amer: 51 mL/min — ABNORMAL LOW (ref >60)
GFR calc non Af Amer: 44 mL/min — ABNORMAL LOW (ref >60)
Glucose, Bld: 88 mg/dL (ref 70–99)
Potassium: 3.1 mmol/L — ABNORMAL LOW (ref 3.5–5.1)
Sodium: 141 mmol/L (ref 135–145)

## 2018-07-11 LAB — LEGIONELLA PNEUMOPHILA SEROGP 1 UR AG: L. pneumophila Serogp 1 Ur Ag: NEGATIVE

## 2018-07-11 LAB — MAGNESIUM: Magnesium: 1.9 mg/dL (ref 1.7–2.4)

## 2018-07-11 MED ORDER — POTASSIUM CHLORIDE CRYS ER 20 MEQ PO TBCR
40.0000 meq | EXTENDED_RELEASE_TABLET | ORAL | Status: AC
  Administered 2018-07-11 (×2): 40 meq via ORAL
  Filled 2018-07-11 (×2): qty 2

## 2018-07-11 NOTE — Clinical Social Work Note (Signed)
Clinical Social Work Assessment  Patient Details  Name: Randy Murphy MRN: [id number] Date of Birth: 08/09/1932  Date of referral:  07/11/18               Reason for consult:  Facility Placement, Discharge Planning                Permission sought to share information with:  Facility Medical sales representativeContact Representative, Family Supports Permission granted to share information::     Name::     Lacretia LeighCarolyn Arntz  Agency::  SNF's  Relationship::  Wife  Contact Information:  [phone]  Housing/Transportation Living arrangements for the past 2 months:  Single Family Home Source of Information:  Medical Team, Spouse, Friend/Neighbor Patient Interpreter Needed:  None Criminal Activity/Legal Involvement Pertinent to Current Situation/Hospitalization:  No - Comment as needed Significant Relationships:  Spouse, Other Family Members, Friend Lives with:  Spouse Do you feel safe going back to the place where you live?  Yes Need for family participation in patient care:  Yes (Comment)  Care giving concerns:  PT recommending SNF placement once medically stable for discharge.   Social Worker assessment / plan:  Patient not fully oriented. Wife and close family friend at bedside. CSW introduced role and explained that PT recommendations would be discussed. Patient's wife agreeable to SNF placement with plan to return home after discharge. [facility] [address] [facility]. CSW left message for [facility] admissions coordinator. No further concerns. CSW encouraged patient's wife to contact CSW as needed. CSW will continue to follow patient and his wife for support and facilitate discharge to SNF once medically stable.  Employment status:  Retired Database administratornsurance information:  Managed Medicare PT Recommendations:  Skilled Nursing Facility Information / Referral to community resources:  Skilled Nursing Facility  Patient/Family's Response to  care:  Patient not fully oriented. Patient's wife supportive and involved in patient's care. Patient's wife supportive and involved in patient's care. Patient's wife appreciated social work intervention.  Patient/Family's Understanding of and Emotional Response to Diagnosis, Current Treatment, and Prognosis:  Patient noto fully oriented. Patient's wife has a good understanding of the reason for admission and his need for rehab prior to returning home. Patient's wife appears happy with hospital care.  Emotional Assessment Appearance:  Appears stated age Attitude/Demeanor/Rapport:  Unable to Assess Affect (typically observed):  Unable to Assess Orientation:  Oriented to Self, Oriented to Place, Oriented to Situation Alcohol / Substance use:  Never Used Psych involvement (Current and /or in the community):  No (Comment)  Discharge Needs  Concerns to be addressed:  Care Coordination Readmission within the last 30 days:  No Current discharge risk:  Dependent with Mobility Barriers to Discharge:  Continued Medical Work up, Insurance Authorization   Margarito LinerSarah C Merri Dimaano, LCSW 07/11/2018, 10:22 AM

## 2018-07-11 NOTE — Progress Notes (Addendum)
Progress Note  Patient Name: Randy Murphy Date of Encounter: 07/11/2018  Primary Cardiologist: Donato Schultz, MD  Subjective   Pt seems a little more alert today. Lower extremity edema appears much improved. He put out almost 5L of urine yesterday. No CP. His voice remains with low tone and low power but I am able to understand he says he doesn't feel any better but doesn't feel any worse. Wife thinks SOB improving. She is struggling with decision of SNF vs continued home care.  Inpatient Medications    Scheduled Meds: . carbidopa-levodopa  1 tablet Oral QHS  . Carbidopa-Levodopa ER  2 tablet Oral 4 times per day  . clonazePAM  0.5 mg Oral QHS  . finasteride  5 mg Oral Daily  . fludrocortisone  0.1 mg Oral Daily  . furosemide  40 mg Intravenous BID  . potassium chloride  40 mEq Oral Q4H  . sodium chloride flush  3 mL Intravenous Q12H   Continuous Infusions: . sodium chloride     PRN Meds: sodium chloride, acetaminophen **OR** acetaminophen, hydrALAZINE, HYDROcodone-acetaminophen, ondansetron **OR** ondansetron (ZOFRAN) IV, sodium chloride flush   Vital Signs    Vitals:   07/10/18 1948 07/11/18 0402 07/11/18 1020 07/11/18 1126  BP: (!) 148/64 (!) 159/77 (!) 119/56 (!) 151/80  Pulse: 70 80  73  Resp: (!) 24 20 20 18   Temp: 98.4 F (36.9 C) 98.1 F (36.7 C)  98.7 F (37.1 C)  TempSrc: Oral Oral  Oral  SpO2: 94% 95% 91% 95%  Weight:  81.2 kg    Height:        Intake/Output Summary (Last 24 hours) at 07/11/2018 1147 Last data filed at 07/11/2018 0405 Gross per 24 hour  Intake 309.04 ml  Output 4825 ml  Net -4515.96 ml   Filed Weights   07/09/18 1740 07/10/18 0437 07/11/18 0402  Weight: 87.1 kg 86.2 kg 81.2 kg    Telemetry    NSR LBBB with brief episode of Wenckebach - Personally Reviewed  Physical Exam   GEN: No acute distress. Somewhat disheveled appearing  HEENT: Normocephalic, atraumatic, sclera non-icteric. Neck: No JVD or bruits. Cardiac: RRR no  murmurs, rubs, or gallops.  Radials/DP/PT 1+ and equal bilaterally.  Respiratory: Clear to auscultation bilaterally anteriorly. Mild increased WOB after speaking but otherwise improved.  GI: Soft, nontender, non-distended, BS +x 4. MS: no deformity. Extremities: + varicose veins. No edema. Distal pedal pulses are 2+ and equal bilaterally. Neuro: Answers simple questions with brief responses but dysphonia persists. Follows commands. PsychAbran Cantor today  Labs    Chemistry Recent Labs  Lab 07/09/18 1259 07/10/18 0724 07/11/18 0518  NA 142 145 141  K 3.4* 2.9* 3.1*  CL 107 109 103  CO2 27 28 30   GLUCOSE 126* 87 88  BUN 22 21 20   CREATININE 1.06 1.23 1.42*  CALCIUM 9.1 8.5* 8.4*  GFRNONAA >60 52* 44*  GFRAA >60 >60 51*  ANIONGAP 8 8 8      Hematology Recent Labs  Lab 07/09/18 1259 07/11/18 0518  WBC 6.9 7.2  RBC 4.42 4.24  HGB 13.3 13.1  HCT 44.8 41.7  MCV 101.4* 98.3  MCH 30.1 30.9  MCHC 29.7* 31.4  RDW 14.5 14.1  PLT 251 205    Cardiac Enzymes Recent Labs  Lab 07/09/18 1826 07/09/18 2025 07/10/18 0724  TROPONINI 0.06* 0.08* 0.08*    Recent Labs  Lab 07/09/18 1309  TROPIPOC 0.03     BNP Recent Labs  Lab 07/09/18 1259  BNP 752.0*     DDimer No results for input(s): DDIMER in the last 168 hours.   Radiology    Dg Chest 2 View  Result Date: 07/09/2018 CLINICAL DATA:  Shortness of breath. EXAM: CHEST - 2 VIEW COMPARISON:  None. FINDINGS: Mild cardiomegaly is noted. No pneumothorax is noted. Mild left basilar atelectasis or infiltrate is noted with mild associated pleural effusion. Right lung is unremarkable. Bony thorax is unremarkable. IMPRESSION: Mild left basilar atelectasis or infiltrate is noted with mild left pleural effusion. Electronically Signed   By: Lupita Raider, M.D.   On: 07/09/2018 14:43   Vas [place] Lower Extremity Venous (dvt)  Result Date: 07/10/2018  Lower Venous Study Indications: Swelling.  Performing Technologist: Chanda Busing RVT  Examination Guidelines: A complete evaluation includes B-mode imaging, spectral Doppler, color Doppler, and power Doppler as needed of all accessible portions of each vessel. Bilateral testing is considered an integral part of a complete examination. Limited examinations for reoccurring indications may be performed as noted.  Right Venous Findings: +---------+---------------+---------+-----------+----------+-------+          CompressibilityPhasicitySpontaneityPropertiesSummary +---------+---------------+---------+-----------+----------+-------+ CFV      Full           Yes      Yes                          +---------+---------------+---------+-----------+----------+-------+ SFJ      Full                                                 +---------+---------------+---------+-----------+----------+-------+ FV Prox  Full                                                 +---------+---------------+---------+-----------+----------+-------+ FV Mid   Full                                                 +---------+---------------+---------+-----------+----------+-------+ FV DistalFull                                                 +---------+---------------+---------+-----------+----------+-------+ PFV      Full                                                 +---------+---------------+---------+-----------+----------+-------+ POP      Full           Yes      Yes                          +---------+---------------+---------+-----------+----------+-------+ PTV      Full                                                 +---------+---------------+---------+-----------+----------+-------+  PERO     Full                                                 +---------+---------------+---------+-----------+----------+-------+  Left Venous Findings: +---------+---------------+---------+-----------+----------+-------+           CompressibilityPhasicitySpontaneityPropertiesSummary +---------+---------------+---------+-----------+----------+-------+ CFV      Full           Yes      Yes                          +---------+---------------+---------+-----------+----------+-------+ SFJ      Full                                                 +---------+---------------+---------+-----------+----------+-------+ FV Prox  Full                                                 +---------+---------------+---------+-----------+----------+-------+ FV Mid   Full                                                 +---------+---------------+---------+-----------+----------+-------+ FV DistalFull                                                 +---------+---------------+---------+-----------+----------+-------+ PFV      Full                                                 +---------+---------------+---------+-----------+----------+-------+ POP      Full           Yes      Yes                          +---------+---------------+---------+-----------+----------+-------+ PTV      Full                                                 +---------+---------------+---------+-----------+----------+-------+ PERO     Full                                                 +---------+---------------+---------+-----------+----------+-------+    Summary: Right: There is no evidence of deep vein thrombosis in the lower extremity. No cystic structure found in the popliteal fossa. Left: There is no evidence of deep vein thrombosis in the lower extremity. No cystic structure found in the popliteal fossa.  *  See table(s) above for measurements and observations. Electronically signed by Fabienne Bruns MD on 07/10/2018 at 4:25:01 PM.    Final     Cardiac Studies   2d echo 07/10/18 Study Conclusions  - Left ventricle: The cavity size was normal. Wall thickness was   increased in a pattern of mild LVH. Systolic  function was   moderately reduced. The estimated ejection fraction was in the   range of 35% to 40%. Diffuse hypokinesis. Features are consistent   with a pseudonormal left ventricular filling pattern, with   concomitant abnormal relaxation and increased filling pressure   (grade 2 diastolic dysfunction). - Aortic valve: There was no stenosis. There was moderate   regurgitation. - Mitral valve: Mildly calcified annulus. There was mild to   moderate regurgitation. - Left atrium: The atrium was severely dilated. - Right ventricle: The cavity size was normal. Systolic function   was normal. - Right atrium: The atrium was mildly dilated. - Tricuspid valve: There was moderate regurgitation. Peak RV-RA   gradient (S): 66 mm Hg. - Pulmonary arteries: PA peak pressure: 81 mm Hg (S). - Systemic veins: IVC measured 3 cm with < 50% respirophasic   variation, suggesting RA pressure 15 mmHg. - Pericardium, extracardiac: A trivial pericardial effusion was   identified. There was a pericardial effusion.  Impressions:  - Normal LV size with mild LV hypertrophy. EF 35-40%, diffuse   hypokinesis. Moderate diastolic dysfunction. Normal RV size and   systolic function. Severe pulmonary hypertension. Dilated IVC   suggestive of elevated RV filling pressure. Mild to moderate MR.   Moderate AI.  Patient Profile     84 y.o. 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 Also being treated for possible CAP.  Assessment & Plan    1. Shortness of breath/edema likely due to acute combined CHF with severe pulm HTN, also mild-mod MR, mod AI  - weaning off Florinef.  Weight 190->179 (?accurate). I/O's do represent net negative loss. Will ask nurse to re-weigh  patient to verify. Cr has bumped so may need to scale back on Lasix. May even be able to change to oral form. He is diuretic naive. If persistent hyopkalemia remains an issue, could even consider changing instead to spironolactone. Need to be cautious given h/o orthostasis. Avoid BB for now given intermittent bradycardia. ACEI/ARB has been considered but with bump in Cr, would hold off until we see trajectory.  2. Arrhythmia with possible transient rate-controlled AF this admission, also intermittent junctional beats, and 1 episode noted of 2nd degree type 1 AV block - rhythm relatively stable overnight except 1 episode of Wenckebach. Not an anticoag candidate with frequent falls. Continue to monitor.  3. LBBB - unclear chronicity. Do not feel this represents acute MI. Some conduction disease noted on tele as above. Monitor conservatively.  4. Elevated troponin - suspect demand ischemia. LV dysfunction is out of proportion to mildly elevated troponin, so not suspected to be ACS.   5. H/o orthostatic hypotension - pt now spends majority of his time seated. He has been persistently hypertensive here. Tapering off Florinef. Can use midodrine in future if needed.  6. Parkinson's disease - per IM.  7. Hypokalemia - being repleted by internal medicine.  Given complex medical processes  with recent failure to thrive, dementia, Parkinson's and new CHF, would consider goals of care discussion.   For questions or updates, please contact [facility] Please consult [email or web address] for contact info under Cardiology/STEMI.  Signed, Laurann Montana, PA-C 07/11/2018, 11:47 AM    Personally seen and examined. Agree with above.  Overall potentially slightly more alert but still not at baseline.  Breathing and edema mildly improved but not at baseline.  On exam, resting comfortably in bed alert but sleepy at times regular rate and rhythm lungs with minimal crackles at bases lower extremity with 2+ edema  versus 4+ yesterday.  Lab work reviewed- creatinine increased from 1.06>  1.23> 1.42, expected with diuresis.  Potassium 3.1.  Replete.  Assessment and plan:  Acute systolic heart failure -Continue with diuresis, medications reviewed as above.  Hypokalemia -Replete potassium  Parkinson's - Complex with dysautonomia. -Tapering off fludrocortisone which was contributing to increasing intravascular volume.  Being careful with labile blood pressures.  Second-degree heart block type I with left bundle branch block - Continue to monitor.  No indication for pacemaker.  Donato Schultz, MD

## 2018-07-11 NOTE — Clinical Social Work Placement (Signed)
   CLINICAL SOCIAL WORK PLACEMENT  NOTE  Date:  07/11/2018  Patient Details  Name: Randy Murphy MRN: [id number] Date of Birth: 03/21/1932  Clinical Social Work is seeking post-discharge placement for this patient at the Skilled  Nursing Facility level of care (*CSW will initial, date and re-position this form in  chart as items are completed):  Yes   Patient/family provided with [facility] Department's list of facilities offering this level of care within the geographic area requested by the patient (or if unable, by the patient's family).  Yes   Patient/family informed of their freedom to choose among providers that offer the needed level of care, that participate in Medicare, Medicaid or managed care program needed by the patient, have an available bed and are willing to accept the patient.  Yes   [facility]/family informed of [facility] and [facility], as well as of the fact that they are under no obligation to receive care at these facilities.  PASRR submitted to EDS on 07/11/18     PASRR number received on 07/11/18     Existing PASRR number confirmed on       FL2 transmitted to all facilities in geographic area requested by pt/family on 07/11/18     FL2 transmitted to all facilities within larger geographic area on       Patient informed that his/her managed care company has contracts with or will negotiate with certain facilities, including the following:            Patient/family informed of bed offers received.  Patient chooses bed at       Physician recommends and patient chooses bed at      Patient to be transferred to   on  .  Patient to be transferred to facility by       Patient family notified on   of transfer.  Name of family member notified:        PHYSICIAN Please sign FL2     Additional Comment:    _______________________________________________ Margarito LinerSarah C Alpha Chouinard, LCSW 07/11/2018,  10:32 AM

## 2018-07-11 NOTE — Progress Notes (Signed)
Patient ID: Randy Murphy, male   DOB: [birth year], 84 y.o.   MRN: [id number]  PROGRESS NOTE    RIPLEY BOGOSIAN  KGM:[id number] DOB: [birth year] DOA: 07/09/2018 PCP: Rodrigo Ran, MD   Brief Narrative:  84 year old male with history of dementia, Parkinson's disease, hypertension presented with worsening cough with fever and chills despite being treated with Levaquin for a week by his primary care provider.  He was admitted for CHF exacerbation along with pneumonia and started on IV antibiotics and Lasix.  Cardiology consulted.   Assessment & Plan:   Active Problems:   Congestive heart failure (CHF) (HCC)  Acute probably systolic and diastolic heart failure exacerbation -Patient does not have a history of heart failure -Echo showed EF of 30 to 35% with diffuse hypokinesis and grade 2 diastolic dysfunction with moderate aortic regurgitation and mild to moderate mitral regurgitation and moderate tricuspid regurgitation -Cardiology following.  Continue intravenous Lasix.  Strict input and output.  Daily weights.  Negative balance of 5086 cc since admission.  Lower extremity duplex was negative for DVT -Taper off Florinef which is contributing to increased intravascular volume  Mildly positive troponins -Probably secondary to above.  No current chest pain.  Follow cardiology recommendations  Recent treatment for community acquired pneumonia -Patient was treated with Levaquin for a week by primary care provider recently.  Doubt that he still has bacterial pneumonia.  Patient was started on Rocephin and Zithromax as well on admission.  Will discontinue antibiotics and continue diuresis.  Hypokalemia Replace.  Repeat a.m. labs  History of Parkinson's disease and dementia along with dysautonomia -Continue Sinemet -Continue Florinef taper.  Watch out for orthostatic hypotension  Generalized deconditioning -PT recommends SNF placement.  Social worker consult.  DVT prophylaxis: Lovenox Code  Status: Full Family Communication: Spoke to wife at bedside Disposition Plan: Probably discharge to SNF in 1 to 3 days if clinically stable and cleared by cardiology  Consultants: Cardiology  Procedures:  Echo on 07/10/2018 Study Conclusions  - Left ventricle: The cavity size was normal. Wall thickness was   increased in a pattern of mild LVH. Systolic function was   moderately reduced. The estimated ejection fraction was in the   range of 35% to 40%. Diffuse hypokinesis. Features are consistent   with a pseudonormal left ventricular filling pattern, with   concomitant abnormal relaxation and increased filling pressure   (grade 2 diastolic dysfunction). - Aortic valve: There was no stenosis. There was moderate   regurgitation. - Mitral valve: Mildly calcified annulus. There was mild to   moderate regurgitation. - Left atrium: The atrium was severely dilated. - Right ventricle: The cavity size was normal. Systolic function   was normal. - Right atrium: The atrium was mildly dilated. - Tricuspid valve: There was moderate regurgitation. Peak RV-RA   gradient (S): 66 mm Hg. - Pulmonary arteries: PA peak pressure: 81 mm Hg (S). - Systemic veins: IVC measured 3 cm with < 50% respirophasic   variation, suggesting RA pressure 15 mmHg. - Pericardium, extracardiac: A trivial pericardial effusion was   identified. There was a pericardial effusion.  Impressions:  - Normal LV size with mild LV hypertrophy. EF 35-40%, diffuse   hypokinesis. Moderate diastolic dysfunction. Normal RV size and   systolic function. Severe pulmonary hypertension. Dilated IVC   suggestive of elevated RV filling pressure. Mild to moderate MR.   Moderate AI.  Bilateral lower extremity duplex on 07/10/2018: No DVT  Antimicrobials: Rocephin and Zithromax from 07/09/2018 onwards   Subjective: Patient  seen and examined at bedside.  Wife present at bedside.  Patient still feels very weak and tired but  slightly better.  No overnight worsening chest pain, shortness of breath or fever. Objective: Vitals:   07/10/18 1213 07/10/18 1948 07/11/18 0402 07/11/18 1020  BP: (!) 155/86 (!) 148/64 (!) 159/77 (!) 119/56  Pulse: 83 70 80   Resp: (!) 22 (!) 24 20 20   Temp: 98.4 F (36.9 C) 98.4 F (36.9 C) 98.1 F (36.7 C)   TempSrc: Oral Oral Oral   SpO2: 96% 94% 95% 91%  Weight:   81.2 kg   Height:        Intake/Output Summary (Last 24 hours) at 07/11/2018 1115 Last data filed at 07/11/2018 0405 Gross per 24 hour  Intake 309.04 ml  Output 4825 ml  Net -4515.96 ml   Filed Weights   07/09/18 1740 07/10/18 0437 07/11/18 0402  Weight: 87.1 kg 86.2 kg 81.2 kg    Examination:  General exam: Elderly male lying in bed, no distress.  Speech is slightly slurred, wife states that this happens when patient gets ill. Respiratory system: Bilateral decreased breath sounds at bases with some scattered crackles, no wheezing Cardiovascular system: S1 & S2 heard, Rate controlled Gastrointestinal system: Abdomen is nondistended, soft and nontender. Normal bowel sounds heard. Extremities: No cyanosis; trace edema, improved since yesterday  Data Reviewed: I have personally reviewed following labs and imaging studies  CBC: Recent Labs  Lab 07/09/18 1259 07/11/18 0518  WBC 6.9 7.2  NEUTROABS 5.3 4.6  HGB 13.3 13.1  HCT 44.8 41.7  MCV 101.4* 98.3  PLT 251 205   Basic Metabolic Panel: Recent Labs  Lab 07/09/18 1259 07/10/18 0724 07/11/18 0518  NA 142 145 141  K 3.4* 2.9* 3.1*  CL 107 109 103  CO2 27 28 30   GLUCOSE 126* 87 88  BUN 22 21 20   CREATININE 1.06 1.23 1.42*  CALCIUM 9.1 8.5* 8.4*  MG  --  2.0 1.9   GFR: Estimated Creatinine Clearance: 41.2 mL/min (A) (by C-G formula based on SCr of 1.42 mg/dL (H)). Liver Function Tests: No results for input(s): AST, ALT, ALKPHOS, BILITOT, PROT, ALBUMIN in the last 168 hours. No results for input(s): LIPASE, AMYLASE in the last 168  hours. No results for input(s): AMMONIA in the last 168 hours. Coagulation Profile: No results for input(s): INR, PROTIME in the last 168 hours. Cardiac Enzymes: Recent Labs  Lab 07/09/18 1826 07/09/18 2025 07/10/18 0724  TROPONINI 0.06* 0.08* 0.08*   BNP (last 3 results) No results for input(s): PROBNP in the last 8760 hours. HbA1C: No results for input(s): HGBA1C in the last 72 hours. CBG: No results for input(s): GLUCAP in the last 168 hours. Lipid Profile: No results for input(s): CHOL, HDL, LDLCALC, TRIG, CHOLHDL, LDLDIRECT in the last 72 hours. Thyroid Function Tests: Recent Labs    07/09/18 1827  TSH 3.757   Anemia Panel: No results for input(s): VITAMINB12, FOLATE, FERRITIN, TIBC, IRON, RETICCTPCT in the last 72 hours. Sepsis Labs: No results for input(s): PROCALCITON, LATICACIDVEN in the last 168 hours.  Recent Results (from the past 240 hour(s))  Culture, blood (routine x 2) Call MD if unable to obtain prior to antibiotics being given     Status: None (Preliminary result)   Collection Time: 07/09/18  6:15 PM  Result Value Ref Range Status   Specimen Description BLOOD RIGHT ANTECUBITAL  Final   Special Requests   Final    BOTTLES DRAWN AEROBIC AND ANAEROBIC  Blood Culture adequate volume   Culture   Final    NO GROWTH < 24 HOURS Performed at [facility], [address], [place], [place] [address]    Report Status PENDING  Incomplete  Culture, blood (routine x 2) Call MD if unable to obtain prior to antibiotics being given     Status: None (Preliminary result)   Collection Time: 07/09/18  6:30 PM  Result Value Ref Range Status   Specimen Description BLOOD RIGHT HAND  Final   Special Requests   Final    BOTTLES DRAWN AEROBIC AND ANAEROBIC Blood Culture adequate volume   Culture   Final    NO GROWTH < 24 HOURS Performed at [facility], [address] [place], [place] [address]    Report Status PENDING  Incomplete         Radiology  Studies: Dg Chest 2 View  Result Date: 07/09/2018 CLINICAL DATA:  Shortness of breath. EXAM: CHEST - 2 VIEW COMPARISON:  None. FINDINGS: Mild cardiomegaly is noted. No pneumothorax is noted. Mild left basilar atelectasis or infiltrate is noted with mild associated pleural effusion. Right lung is unremarkable. Bony thorax is unremarkable. IMPRESSION: Mild left basilar atelectasis or infiltrate is noted with mild left pleural effusion. Electronically Signed   By: Lupita Raider, M.D.   On: 07/09/2018 14:43   Vas [place] Lower Extremity Venous (dvt)  Result Date: 07/10/2018  Lower Venous Study Indications: Swelling.  Performing Technologist: Chanda Busing RVT  Examination Guidelines: A complete evaluation includes B-mode imaging, spectral Doppler, color Doppler, and power Doppler as needed of all accessible portions of each vessel. Bilateral testing is considered an integral part of a complete examination. Limited examinations for reoccurring indications may be performed as noted.  Right Venous Findings: +---------+---------------+---------+-----------+----------+-------+          CompressibilityPhasicitySpontaneityPropertiesSummary +---------+---------------+---------+-----------+----------+-------+ CFV      Full           Yes      Yes                          +---------+---------------+---------+-----------+----------+-------+ SFJ      Full                                                 +---------+---------------+---------+-----------+----------+-------+ FV Prox  Full                                                 +---------+---------------+---------+-----------+----------+-------+ FV Mid   Full                                                 +---------+---------------+---------+-----------+----------+-------+ FV DistalFull                                                 +---------+---------------+---------+-----------+----------+-------+ PFV      Full                                                  +---------+---------------+---------+-----------+----------+-------+  POP      Full           Yes      Yes                          +---------+---------------+---------+-----------+----------+-------+ PTV      Full                                                 +---------+---------------+---------+-----------+----------+-------+ PERO     Full                                                 +---------+---------------+---------+-----------+----------+-------+  Left Venous Findings: +---------+---------------+---------+-----------+----------+-------+          CompressibilityPhasicitySpontaneityPropertiesSummary +---------+---------------+---------+-----------+----------+-------+ CFV      Full           Yes      Yes                          +---------+---------------+---------+-----------+----------+-------+ SFJ      Full                                                 +---------+---------------+---------+-----------+----------+-------+ FV Prox  Full                                                 +---------+---------------+---------+-----------+----------+-------+ FV Mid   Full                                                 +---------+---------------+---------+-----------+----------+-------+ FV DistalFull                                                 +---------+---------------+---------+-----------+----------+-------+ PFV      Full                                                 +---------+---------------+---------+-----------+----------+-------+ POP      Full           Yes      Yes                          +---------+---------------+---------+-----------+----------+-------+ PTV      Full                                                 +---------+---------------+---------+-----------+----------+-------+  PERO     Full                                                  +---------+---------------+---------+-----------+----------+-------+    Summary: Right: There is no evidence of deep vein thrombosis in the lower extremity. No cystic structure found in the popliteal fossa. Left: There is no evidence of deep vein thrombosis in the lower extremity. No cystic structure found in the popliteal fossa.  *See table(s) above for measurements and observations. Electronically signed by Fabienne Bruns MD on 07/10/2018 at 4:25:01 PM.    Final         Scheduled Meds: . azithromycin  500 mg Oral Q24H  . carbidopa-levodopa  1 tablet Oral QHS  . Carbidopa-Levodopa ER  2 tablet Oral 4 times per day  . clonazePAM  0.5 mg Oral QHS  . enoxaparin (LOVENOX) injection  30 mg Subcutaneous Q24H  . finasteride  5 mg Oral Daily  . fludrocortisone  0.1 mg Oral Daily  . furosemide  40 mg Intravenous BID  . potassium chloride  40 mEq Oral Q4H  . sodium chloride flush  3 mL Intravenous Q12H   Continuous Infusions: . sodium chloride    . cefTRIAXone (ROCEPHIN)  IV 1 g (07/10/18 1625)     LOS: 2 days        Glade Lloyd, MD [facility] Pager [phone]  If 7PM-7AM, please contact night-coverage [email or web address] Password [email or web address] 07/11/2018, 11:15 AM

## 2018-07-11 NOTE — Clinical Social Work Note (Addendum)
[facility] will review referral and notify CSW of decision.  Charlynn CourtSarah Curley Hogen, CSW [phone]  4:25 pm [facility]. Will follow up with wife tomorrow with other bed offers. So [facility]  Charlynn CourtSarah Meggan Dhaliwal, CSW [phone]

## 2018-07-11 NOTE — NC FL2 (Signed)
[place] MEDICAID FL2 LEVEL OF CARE SCREENING TOOL     IDENTIFICATION  Patient Name: Randy HockDonald H Romberger Birthdate: 12/30/1931 Sex: male Admission Date (Current Location): 07/09/2018  [facility] [place]   Facility and Address:  [facility], [address], [place], [place] [address]      Provider Number: [id number]  Attending Physician Name and Address:  Glade LloydAlekh, Kshitiz, MD  Relative Name and Phone Number:       Current Level of Care: Hospital Recommended Level of Care: Skilled Nursing Facility Prior Approval Number:    Date Approved/Denied:   PASRR Number: [id number]  Discharge Plan: SNF    Current Diagnoses: Patient Active Problem List   Diagnosis Date Noted  . Congestive heart failure (CHF) (HCC) 07/09/2018  . Hypersomnia, periodic 02/18/2014  . 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 H/O viral encephalitis 02/18/2014  . Hearing loss of both ears   . Orthostatic hypotension 05/27/2011    Orientation RESPIRATION BLADDER Height & Weight     Self, Situation, Place  O2(Nasal Canula 4 L) Incontinent Weight: 179 lb 0.2 oz (81.2 kg) Height:  5\' 11"  (180.3 cm)  BEHAVIORAL SYMPTOMS/MOOD NEUROLOGICAL BOWEL NUTRITION STATUS  (None) (None) Continent Diet(Heart healthy. Fluid restriction 1500 mL.)  AMBULATORY STATUS COMMUNICATION OF NEEDS Skin   Extensive Assist Verbally Normal                       Personal Care Assistance Level of Assistance              Functional Limitations Info  Sight, Hearing, Speech Sight Info: Adequate Hearing Info: Impaired Speech Info: Impaired    SPECIAL CARE FACTORS FREQUENCY  PT (By licensed PT)     PT Frequency: 5 x week              Contractures Contractures Info: Not present    Additional Factors Info  Code Status, Allergies Code Status Info: Full code Allergies Info: NKDA           Current Medications  (07/11/2018):  This is the current hospital active medication list Current Facility-Administered Medications  Medication Dose Route Frequency Provider Last Rate Last Dose  . 0.9 %  sodium chloride infusion  250 mL Intravenous PRN Carron CurieHijazi, Ali, MD      . acetaminophen (TYLENOL) tablet 650 mg  650 mg Oral Q6H PRN Carron CurieHijazi, Ali, MD       Or  . acetaminophen (TYLENOL) suppository 650 mg  650 mg Rectal Q6H PRN Carron CurieHijazi, Ali, MD      . azithromycin (ZITHROMAX) tablet 500 mg  500 mg Oral Q24H Carron CurieHijazi, Ali, MD   500 mg at 07/10/18 1628  . carbidopa-levodopa (SINEMET CR) 50-200 MG per tablet controlled release 1 tablet  1 tablet Oral QHS Carron CurieHijazi, Ali, MD   1 tablet at 07/10/18 2122  . Carbidopa-Levodopa ER (SINEMET CR) 25-100 MG tablet controlled release 2 tablet  2 tablet Oral 4 times per day Carron CurieHijazi, Ali, MD   2 tablet at 07/11/18 1010  . cefTRIAXone (ROCEPHIN) 1 g in sodium chloride 0.9 % 100 mL IVPB  1 g Intravenous Q24H Carron CurieHijazi, Ali, MD 200 mL/hr at 07/10/18 1625 1 g at 07/10/18 1625  . clonazePAM (KLONOPIN) tablet 0.5 mg  0.5 mg Oral QHS Carron CurieHijazi, Ali, MD   0.5 mg at 07/10/18 2122  . enoxaparin (LOVENOX) injection 30 mg  30 mg Subcutaneous Q24H Carron CurieHijazi, Ali, MD  30 mg at 07/10/18 2122  . finasteride (PROSCAR) tablet 5 mg  5 mg Oral Daily Carron Curie, MD   5 mg at 07/11/18 1009  . fludrocortisone (FLORINEF) tablet 0.1 mg  0.1 mg Oral Daily Dunn, Dayna N, PA-C   0.1 mg at 07/11/18 1009  . furosemide (LASIX) injection 40 mg  40 mg Intravenous BID Carron Curie, MD   40 mg at 07/11/18 1009  . hydrALAZINE (APRESOLINE) injection 5 mg  5 mg Intravenous Q6H PRN Narda Bonds, MD      . HYDROcodone-acetaminophen (NORCO/VICODIN) 5-325 MG per tablet 1-2 tablet  1-2 tablet Oral Q4H PRN Carron Curie, MD   1 tablet at 07/11/18 0219  . ondansetron (ZOFRAN) tablet 4 mg  4 mg Oral Q6H PRN Carron Curie, MD       Or  . ondansetron (ZOFRAN) injection 4 mg  4 mg Intravenous Q6H PRN Carron Curie, MD      . potassium chloride SA  (K-DUR,KLOR-CON) CR tablet 40 mEq  40 mEq Oral Q4H Alekh, Kshitiz, MD   40 mEq at 07/11/18 1009  . sodium chloride flush (NS) 0.9 % injection 3 mL  3 mL Intravenous Q12H Carron Curie, MD   3 mL at 07/10/18 1026  . sodium chloride flush (NS) 0.9 % injection 3 mL  3 mL Intravenous PRN Carron Curie, MD         Discharge Medications: Please see discharge summary for a list of discharge medications.  Relevant Imaging Results:  Relevant Lab Results:   Additional Information SS#: [id number]  Margarito Liner, LCSW

## 2018-07-11 NOTE — Plan of Care (Signed)
  Problem: Activity: Goal: Capacity to carry out activities will improve Outcome: Progressing   Problem: Education: Goal: Ability to demonstrate management of disease process will improve Outcome: Progressing   Problem: Education: Goal: Ability to verbalize understanding of medication therapies will improve Outcome: Progressing   

## 2018-07-12 DIAGNOSIS — G901 Familial dysautonomia [Riley-Day]: Secondary | ICD-10-CM

## 2018-07-12 DIAGNOSIS — F0280 Dementia in other diseases classified elsewhere without behavioral disturbance: Secondary | ICD-10-CM

## 2018-07-12 DIAGNOSIS — R7989 Other specified abnormal findings of blood chemistry: Secondary | ICD-10-CM

## 2018-07-12 DIAGNOSIS — I10 Essential (primary) hypertension: Secondary | ICD-10-CM

## 2018-07-12 DIAGNOSIS — I5041 Acute combined systolic (congestive) and diastolic (congestive) heart failure: Secondary | ICD-10-CM

## 2018-07-12 DIAGNOSIS — I5043 Acute on chronic combined systolic (congestive) and diastolic (congestive) heart failure: Secondary | ICD-10-CM

## 2018-07-12 LAB — CBC WITH DIFFERENTIAL/PLATELET
Abs Immature Granulocytes: 0.03 10*3/uL (ref 0.00–0.07)
Basophils Absolute: 0 10*3/uL (ref 0.0–0.1)
Basophils Relative: 1 %
Eosinophils Absolute: 0.2 10*3/uL (ref 0.0–0.5)
Eosinophils Relative: 3 %
HCT: 44.7 % (ref 39.0–52.0)
Hemoglobin: 14.1 g/dL (ref 13.0–17.0)
Immature Granulocytes: 0 %
Lymphocytes Relative: 25 %
Lymphs Abs: 1.7 10*3/uL (ref 0.7–4.0)
MCH: 31 pg (ref 26.0–34.0)
MCHC: 31.5 g/dL (ref 30.0–36.0)
MCV: 98.2 fL (ref 80.0–100.0)
Monocytes Absolute: 1.1 10*3/uL — ABNORMAL HIGH (ref 0.1–1.0)
Monocytes Relative: 16 %
Neutro Abs: 3.8 10*3/uL (ref 1.7–7.7)
Neutrophils Relative %: 55 %
Platelets: 223 10*3/uL (ref 150–400)
RBC: 4.55 MIL/uL (ref 4.22–5.81)
RDW: 13.5 % (ref 11.5–15.5)
WBC: 6.8 10*3/uL (ref 4.0–10.5)
nRBC: 0 % (ref 0.0–0.2)

## 2018-07-12 LAB — BASIC METABOLIC PANEL
Anion gap: 9 (ref 5–15)
BUN: 20 mg/dL (ref 8–23)
CO2: 31 mmol/L (ref 22–32)
Calcium: 9.2 mg/dL (ref 8.9–10.3)
Chloride: 102 mmol/L (ref 98–111)
Creatinine, Ser: 1.35 mg/dL — ABNORMAL HIGH (ref 0.61–1.24)
GFR calc Af Amer: 54 mL/min — ABNORMAL LOW (ref >60)
GFR calc non Af Amer: 47 mL/min — ABNORMAL LOW (ref >60)
Glucose, Bld: 87 mg/dL (ref 70–99)
Potassium: 3.3 mmol/L — ABNORMAL LOW (ref 3.5–5.1)
Sodium: 142 mmol/L (ref 135–145)

## 2018-07-12 LAB — MAGNESIUM: Magnesium: 2 mg/dL (ref 1.7–2.4)

## 2018-07-12 MED ORDER — POTASSIUM CHLORIDE CRYS ER 20 MEQ PO TBCR
40.0000 meq | EXTENDED_RELEASE_TABLET | Freq: Once | ORAL | Status: AC
  Administered 2018-07-12: 40 meq via ORAL
  Filled 2018-07-12: qty 2

## 2018-07-12 NOTE — Progress Notes (Addendum)
Progress Note  Patient Name: Randy Murphy Date of Encounter: 07/12/2018  Primary Cardiologist: Donato Schultz, MD  Subjective   Leg edema has totally resolved. Wife has noticed he's doing more nose breathing overnight and this AM. He remains sleepy but arousable.  Inpatient Medications    Scheduled Meds: . carbidopa-levodopa  1 tablet Oral QHS  . Carbidopa-Levodopa ER  2 tablet Oral 4 times per day  . clonazePAM  0.5 mg Oral QHS  . finasteride  5 mg Oral Daily  . furosemide  40 mg Intravenous BID  . sodium chloride flush  3 mL Intravenous Q12H   Continuous Infusions: . sodium chloride     PRN Meds: sodium chloride, acetaminophen **OR** acetaminophen, hydrALAZINE, HYDROcodone-acetaminophen, ondansetron **OR** ondansetron (ZOFRAN) IV, sodium chloride flush   Vital Signs    Vitals:   07/11/18 1126 07/11/18 1220 07/11/18 2009 07/12/18 0534  BP: (!) 151/80  134/77 (!) 157/85  Pulse: 73  62 78  Resp: 18  20 18   Temp: 98.7 F (37.1 C)  97.6 F (36.4 C) 98.1 F (36.7 C)  TempSrc: Oral  Oral Oral  SpO2: 95%  94% 96%  Weight:  83.1 kg  82.5 kg  Height:        Intake/Output Summary (Last 24 hours) at 07/12/2018 1054 Last data filed at 07/12/2018 1016 Gross per 24 hour  Intake 240 ml  Output 1827 ml  Net -1587 ml   Filed Weights   07/11/18 0402 07/11/18 1220 07/12/18 0534  Weight: 81.2 kg 83.1 kg 82.5 kg    Telemetry    NSR with brief period of atrial fib with HR upper 40s-low 50s - Personally Reviewed   Physical Exam   GEN: Remains somewhat disheveled looking HEENT: Normocephalic, atraumatic, sclera non-icteric. Neck: No JVD or bruits. Cardiac: RRR no murmurs, rubs, or gallops.  Radials/DP/PT 1+ and equal bilaterally.  Respiratory: Clear to auscultation bilaterally. Mild increased WOB when sleeping but when awakens, RR normalizes GI: Soft, nontender, non-distended, BS +x 4. MS: no deformity. Extremities: No clubbing or cyanosis. No edema. Distal pedal  pulses are 2+ and equal bilaterally. Neuro:  Low power and tone behind voice remain. Answers simple questions with brief answers. Remains sleepy but arousable. Psych:  Responds to questions appropriately with a normal affect.  Labs    Chemistry Recent Labs  Lab 07/10/18 0724 07/11/18 0518 07/12/18 0356  NA 145 141 142  K 2.9* 3.1* 3.3*  CL 109 103 102  CO2 28 30 31   GLUCOSE 87 88 87  BUN 21 20 20   CREATININE 1.23 1.42* 1.35*  CALCIUM 8.5* 8.4* 9.2  GFRNONAA 52* 44* 47*  GFRAA >60 51* 54*  ANIONGAP 8 8 9      Hematology Recent Labs  Lab 07/09/18 1259 07/11/18 0518 07/12/18 0356  WBC 6.9 7.2 6.8  RBC 4.42 4.24 4.55  HGB 13.3 13.1 14.1  HCT 44.8 41.7 44.7  MCV 101.4* 98.3 98.2  MCH 30.1 30.9 31.0  MCHC 29.7* 31.4 31.5  RDW 14.5 14.1 13.5  PLT 251 205 223    Cardiac Enzymes Recent Labs  Lab 07/09/18 1826 07/09/18 2025 07/10/18 0724  TROPONINI 0.06* 0.08* 0.08*    Recent Labs  Lab 07/09/18 1309  TROPIPOC 0.03     BNP Recent Labs  Lab 07/09/18 1259  BNP 752.0*     DDimer No results for input(s): DDIMER in the last 168 hours.   Radiology    Vas [place] Lower Extremity Venous (dvt)  Result Date:  07/10/2018  Lower Venous Study Indications: Swelling.  Performing Technologist: Chanda BusingGregory Collins RVT  Examination Guidelines: A complete evaluation includes B-mode imaging, spectral Doppler, color Doppler, and power Doppler as needed of all accessible portions of each vessel. Bilateral testing is considered an integral part of a complete examination. Limited examinations for reoccurring indications may be performed as noted.  Right Venous Findings: +---------+---------------+---------+-----------+----------+-------+          CompressibilityPhasicitySpontaneityPropertiesSummary +---------+---------------+---------+-----------+----------+-------+ CFV      Full           Yes      Yes                           +---------+---------------+---------+-----------+----------+-------+ SFJ      Full                                                 +---------+---------------+---------+-----------+----------+-------+ FV Prox  Full                                                 +---------+---------------+---------+-----------+----------+-------+ FV Mid   Full                                                 +---------+---------------+---------+-----------+----------+-------+ FV DistalFull                                                 +---------+---------------+---------+-----------+----------+-------+ PFV      Full                                                 +---------+---------------+---------+-----------+----------+-------+ POP      Full           Yes      Yes                          +---------+---------------+---------+-----------+----------+-------+ PTV      Full                                                 +---------+---------------+---------+-----------+----------+-------+ PERO     Full                                                 +---------+---------------+---------+-----------+----------+-------+  Left Venous Findings: +---------+---------------+---------+-----------+----------+-------+          CompressibilityPhasicitySpontaneityPropertiesSummary +---------+---------------+---------+-----------+----------+-------+ CFV      Full           Yes      Yes                          +---------+---------------+---------+-----------+----------+-------+  SFJ      Full                                                 +---------+---------------+---------+-----------+----------+-------+ FV Prox  Full                                                 +---------+---------------+---------+-----------+----------+-------+ FV Mid   Full                                                  +---------+---------------+---------+-----------+----------+-------+ FV DistalFull                                                 +---------+---------------+---------+-----------+----------+-------+ PFV      Full                                                 +---------+---------------+---------+-----------+----------+-------+ POP      Full           Yes      Yes                          +---------+---------------+---------+-----------+----------+-------+ PTV      Full                                                 +---------+---------------+---------+-----------+----------+-------+ PERO     Full                                                 +---------+---------------+---------+-----------+----------+-------+    Summary: Right: There is no evidence of deep vein thrombosis in the lower extremity. No cystic structure found in the popliteal fossa. Left: There is no evidence of deep vein thrombosis in the lower extremity. No cystic structure found in the popliteal fossa.  *See table(s) above for measurements and observations. Electronically signed by Fabienne Bruns MD on 07/10/2018 at 4:25:01 PM.    Final     Cardiac Studies   2d echo 07/10/18 Study Conclusions  - Left ventricle: The cavity size was normal. Wall thickness was increased in a pattern of mild LVH. Systolic function was moderately reduced. The estimated ejection fraction was in the range of 35% to 40%. Diffuse hypokinesis. Features are consistent with a pseudonormal left ventricular filling pattern, with concomitant abnormal relaxation and increased filling pressure (grade 2 diastolic dysfunction). - Aortic valve: There was no stenosis. There was moderate regurgitation. - Mitral valve: Mildly calcified annulus. There was mild to moderate regurgitation. - Left atrium:  The atrium was severely dilated. - Right ventricle: The cavity size was normal. Systolic function was normal. -  Right atrium: The atrium was mildly dilated. - Tricuspid valve: There was moderate regurgitation. Peak RV-RA gradient (S): 66 mm Hg. - Pulmonary arteries: PA peak pressure: 81 mm Hg (S). - Systemic veins: IVC measured 3 cm with < 50% respirophasic variation, suggesting RA pressure 15 mmHg. - Pericardium, extracardiac: A trivial pericardial effusion was identified. There was a pericardial effusion.  Impressions:  - Normal LV size with mild LV hypertrophy. EF 35-40%, diffuse hypokinesis. Moderate diastolic dysfunction. Normal RV size and systolic function. Severe pulmonary hypertension. Dilated IVC suggestive of elevated RV filling pressure. Mild to moderate MR. Moderate AI.  Patient Profile     84 y.o. 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 Also being treated for possible CAP.  Assessment & Plan    1.Shortness of breath/edema likely due to acute combined CHF with severe pulm HTN, also mild-mod MR, mod AI - today was last day of Florinef.  Weight 190-> 183 -> 179, - 6.6L. Cr bumped slightly with diuresis but now stable at 1.35. His volume status has improved but does continue to have some increased WOB. He is diuretic naive. Will review plan for continued diuresis with MD. Need to be cautious with HF med titration given h/o orthostasis. Avoid BB for now given intermittent bradycardia. ACEI/ARB has been considered but with bump in Cr, would hold off until we see trajectory. Spironolactone would also be an option in setting of persistent hypokalemia.  2. Newly recognized paroxysmal atrial fib (rate controlled), intermittent junctional beats, and 1 episode noted of 2nd degree type 1 AV block -AF was first noted on evening  07/09/18 (HR normal). Initially dx was unclear, but this AM had clear cut afib, again, rate controlled if not mildly bradycardic. Avoid BB given underlying conduction disease. Now that afib is definitively seen, will ask MD to make final commentary on anticoagulation, but with frequent falls (requiring EMS to come out to house), may not be candidate. Wife says sometimes he falls 3x/week.  3. LBBB -unclear chronicity. Do not feel this represents acute MI. Some conduction disease noted on tele as above. Monitor conservatively.  4. Elevated troponin - suspect demand ischemia. LV dysfunction is out of proportion to mildly elevated troponin, so not suspected to be ACS.   5. H/o orthostatic hypotension - pt now spends majority of his time seated. He has been persistently hypertensive here. Tapering off Florinef, last day today. Can use midodrine in future if needed.  6. Parkinson's disease - per IM.  7. Hypokalemia - being repleted by internal medicine. Has been getting BID for last few days but K remains low so may need additional scheduled supplementation. Mg normal.  For questions or updates, please contact [facility] Please consult [email or web address] for contact info under Cardiology/STEMI.  Signed, Laurann Montana, PA-C 07/12/2018, 10:54 AM    Personally seen and examined. Agree with above.  84 year old with underlying dementia and acute on chronic systolic heart failure, hyponatremia hypokalemia Parkinson's  Appears fairly comfortable laying in bed wife at bedside. He is awake and alert lungs are clear to auscultation, when awake, no increased respiratory effort, lower extremity edema has vastly improved.  Continues to  have good urine output with IV Lasix 40 mg twice daily, continue today.  Potential change to p.o. Lasix tomorrow.  Once again EF 35%.  Blood pressure improved with improved intravascular volume status.  Tapering off fludrocortisone.  Donato Schultz, MD

## 2018-07-12 NOTE — Clinical Social Work Note (Signed)
Patient[facility] for SNF [facility] or [facility]. [facility]'s [facility]. Admissions coordinator aware and will start insurance authorization. Typically takes 24 hours to obtain.  Charlynn CourtSarah Shonice Wrisley, CSW [phone]

## 2018-07-12 NOTE — Progress Notes (Addendum)
Patient ID: Randy Murphy, male   DOB: May 23, 1932, 84 y.o.   MRN: [id number]  PROGRESS NOTE    CHARVIS LIGHTNER  YQM:[id number] DOB: [birth year] DOA: 07/09/2018 PCP: Rodrigo Ran, MD   Brief Narrative:  84 year old male with history of dementia, Parkinson's disease, hypertension presented with worsening cough with fever and chills despite being treated with Levaquin for a week by his primary care provider.  He was admitted for CHF exacerbation along with pneumonia and started on IV antibiotics and Lasix.  Cardiology consulted.   Assessment & Plan:   Active Problems:   Congestive heart failure (CHF) (HCC)  Acute combined systolic and diastolic CHF/severe pulmonary hypertension -Patient does not have a history of heart failure -Echo showed EF of 30 to 35% with diffuse hypokinesis and grade 2 diastolic dysfunction with moderate aortic regurgitation and mild to moderate mitral regurgitation and moderate tricuspid regurgitation -Cardiology following.  Continue intravenous Lasix.  Strict input and output.  Daily weights.  - -7.2 L since admission.  Lower extremity duplex was negative for DVT -Tapered off Florinef which was contributing to increased intravascular volume.  However as per discussion with PCP, patient had severe orthostatic hypotension due to advanced Parkinson's and managing off of Florinef may be difficult but not an issue if not ambulating much. -Clinically improved.  As per cardiology, continue IV Lasix today and potentially change to oral Lasix tomorrow.  New PAF Noted as per cardiology.  Beta-blockers avoided because of underlying conduction disease.  Reported frequent falls and may not be a long-term safe anticoagulation candidate.  Mildly positive troponins -Probably secondary to above.  No current chest pain.  Cardiology on board.  Suspect demand ischemia.  Recent treatment for community acquired pneumonia -Patient was treated with Levaquin for a week by primary care provider  recently.  Doubt that he still has bacterial pneumonia.  Patient was started on Rocephin and Zithromax as well on admission.  Antibiotics were discontinued.  Hypokalemia Replace and follow.  Magnesium normal.  Parkinson's disease and dementia along with dysautonomia -Continue Sinemet -Florinef now tapered off.  May have issues with orthostatic hypotension as outlined above.  May consider midodrine in the future but that too can cause fluid retention.  Generalized deconditioning -PT recommends SNF placement.  Social worker consulted and working on it.  Acute kidney injury on possible stage 3 CKD. Likely due to aggressive diuresis.  Creatinine has improved from 1.42 yesterday to 1.35.  Follow BMP in a.m.  DVT prophylaxis: Lovenox Code Status: Full Family Communication: Discussed in detail with patient's spouse at bedside.  Updated care and answered questions.  I also discussed and updated patient's PCP. Disposition Plan: Probably discharge to SNF in 24 to 48 hours. Consultants: Cardiology  Procedures:  Echo on 07/10/2018 Study Conclusions  - Left ventricle: The cavity size was normal. Wall thickness was   increased in a pattern of mild LVH. Systolic function was   moderately reduced. The estimated ejection fraction was in the   range of 35% to 40%. Diffuse hypokinesis. Features are consistent   with a pseudonormal left ventricular filling pattern, with   concomitant abnormal relaxation and increased filling pressure   (grade 2 diastolic dysfunction). - Aortic valve: There was no stenosis. There was moderate   regurgitation. - Mitral valve: Mildly calcified annulus. There was mild to   moderate regurgitation. - Left atrium: The atrium was severely dilated. - Right ventricle: The cavity size was normal. Systolic function   was normal. - Right atrium: The  atrium was mildly dilated. - Tricuspid valve: There was moderate regurgitation. Peak RV-RA   gradient (S): 66 mm Hg. -  Pulmonary arteries: PA peak pressure: 81 mm Hg (S). - Systemic veins: IVC measured 3 cm with < 50% respirophasic   variation, suggesting RA pressure 15 mmHg. - Pericardium, extracardiac: A trivial pericardial effusion was   identified. There was a pericardial effusion.  Impressions:  - Normal LV size with mild LV hypertrophy. EF 35-40%, diffuse   hypokinesis. Moderate diastolic dysfunction. Normal RV size and   systolic function. Severe pulmonary hypertension. Dilated IVC   suggestive of elevated RV filling pressure. Mild to moderate MR.   Moderate AI.  Bilateral lower extremity duplex on 07/10/2018: No DVT  Antimicrobials: Rocephin and Zithromax from 07/09/2018 onwards   Subjective: Patient seen sleeping this morning but arousable.  As per spouse, breathing faster this morning but patient reports no dyspnea.  No chest pain reported.  She indicated that patient has been rapidly declining mentally and physically over the last several months.  Objective: Vitals:   07/11/18 1220 07/11/18 2009 07/12/18 0534 07/12/18 1217  BP:  134/77 (!) 157/85 134/69  Pulse:  62 78 71  Resp:  20 18 18   Temp:  97.6 F (36.4 C) 98.1 F (36.7 C) (!) 97.4 F (36.3 C)  TempSrc:  Oral Oral Oral  SpO2:  94% 96% 93%  Weight: 83.1 kg  82.5 kg   Height:        Intake/Output Summary (Last 24 hours) at 07/12/2018 1759 Last data filed at 07/12/2018 1659 Gross per 24 hour  Intake 360 ml  Output 2527 ml  Net -2167 ml   Filed Weights   07/11/18 0402 07/11/18 1220 07/12/18 0534  Weight: 81.2 kg 83.1 kg 82.5 kg    Examination:  General exam: Pleasant elderly male, moderately built and nourished, chronically looking lying propped up in bed and in no obvious distress. Respiratory system: Occasional basal crackles but otherwise clear to auscultation.  No increased work of breathing. Cardiovascular system: S1 and S2 heard, RRR.  No JVD.  2 x 6 systolic murmur at apex.  Trace bilateral ankle edema.   Telemetry personally reviewed: PAF/BBB morphology. Gastrointestinal system: Abdomen is nondistended, soft and nontender. Normal bowel sounds heard. Extremities: No cyanosis; trace edema, improved since yesterday CNS: Sleeping but easily aroused and oriented to self and partly to place.  No focal deficits.  Data Reviewed: I have personally reviewed following labs and imaging studies  CBC: Recent Labs  Lab 07/09/18 1259 07/11/18 0518 07/12/18 0356  WBC 6.9 7.2 6.8  NEUTROABS 5.3 4.6 3.8  HGB 13.3 13.1 14.1  HCT 44.8 41.7 44.7  MCV 101.4* 98.3 98.2  PLT 251 205 223   Basic Metabolic Panel: Recent Labs  Lab 07/09/18 1259 07/10/18 0724 07/11/18 0518 07/12/18 0356  NA 142 145 141 142  K 3.4* 2.9* 3.1* 3.3*  CL 107 109 103 102  CO2 27 28 30 31   GLUCOSE 126* 87 88 87  BUN 22 21 20 20   CREATININE 1.06 1.23 1.42* 1.35*  CALCIUM 9.1 8.5* 8.4* 9.2  MG  --  2.0 1.9 2.0   GFR: Estimated Creatinine Clearance: 43.4 mL/min (A) (by C-G formula based on SCr of 1.35 mg/dL (H)).  Cardiac Enzymes: Recent Labs  Lab 07/09/18 1826 07/09/18 2025 07/10/18 0724  TROPONINI 0.06* 0.08* 0.08*   Thyroid Function Tests: Recent Labs    07/09/18 1827  TSH 3.757     Recent Results (from the  past 240 hour(s))  Culture, blood (routine x 2) Call MD if unable to obtain prior to antibiotics being given     Status: None (Preliminary result)   Collection Time: 07/09/18  6:15 PM  Result Value Ref Range Status   Specimen Description BLOOD RIGHT ANTECUBITAL  Final   Special Requests   Final    BOTTLES DRAWN AEROBIC AND ANAEROBIC Blood Culture adequate volume   Culture   Final    NO GROWTH 3 DAYS Performed at [facility], [address] [place], [place] [address]    Report Status PENDING  Incomplete  Culture, blood (routine x 2) Call MD if unable to obtain prior to antibiotics being given     Status: None (Preliminary result)   Collection Time: 07/09/18  6:30 PM  Result Value Ref Range  Status   Specimen Description BLOOD RIGHT HAND  Final   Special Requests   Final    BOTTLES DRAWN AEROBIC AND ANAEROBIC Blood Culture adequate volume   Culture   Final    NO GROWTH 3 DAYS Performed at [facility], [address] [place], [place] [address]    Report Status PENDING  Incomplete         Radiology Studies: No results found.      Scheduled Meds: . carbidopa-levodopa  1 tablet Oral QHS  . Carbidopa-Levodopa ER  2 tablet Oral 4 times per day  . clonazePAM  0.5 mg Oral QHS  . finasteride  5 mg Oral Daily  . furosemide  40 mg Intravenous BID  . sodium chloride flush  3 mL Intravenous Q12H   Continuous Infusions: . sodium chloride       LOS: 3 days     Marcellus ScottAnand Hongalgi, MD, FACP, [facility]. [facility] Pager [phone]  If 7PM-7AM, please contact night-coverage [email or web address] Password [email or web address] 07/12/2018, 6:17 PM

## 2018-07-12 NOTE — Care Management Important Message (Signed)
Important Message  Patient Details  Name: Randy Murphy MRN: [id number] Date of Birth: 10/13/1931   Medicare Important Message Given:  Yes    Karsten Howry P Skipper Dacosta 07/12/2018, 4:12 PM

## 2018-07-13 DIAGNOSIS — J189 Pneumonia, unspecified organism: Secondary | ICD-10-CM

## 2018-07-13 DIAGNOSIS — I509 Heart failure, unspecified: Secondary | ICD-10-CM

## 2018-07-13 LAB — BASIC METABOLIC PANEL
Anion gap: 10 (ref 5–15)
BUN: 29 mg/dL — ABNORMAL HIGH (ref 8–23)
CO2: 31 mmol/L (ref 22–32)
Calcium: 8.7 mg/dL — ABNORMAL LOW (ref 8.9–10.3)
Chloride: 100 mmol/L (ref 98–111)
Creatinine, Ser: 1.29 mg/dL — ABNORMAL HIGH (ref 0.61–1.24)
GFR calc Af Amer: 57 mL/min — ABNORMAL LOW (ref >60)
GFR calc non Af Amer: 49 mL/min — ABNORMAL LOW (ref >60)
Glucose, Bld: 85 mg/dL (ref 70–99)
Potassium: 4.6 mmol/L (ref 3.5–5.1)
Sodium: 141 mmol/L (ref 135–145)

## 2018-07-13 MED ORDER — FUROSEMIDE 40 MG PO TABS
40.0000 mg | ORAL_TABLET | Freq: Every day | ORAL | Status: DC
  Administered 2018-07-13 – 2018-07-14 (×2): 40 mg via ORAL
  Filled 2018-07-13 (×3): qty 1

## 2018-07-13 NOTE — Progress Notes (Signed)
Physical Therapy Treatment Patient Details Name: Randy Murphy MRN: [id number] DOB: 06-24-1932 Today's Date: 07/13/2018    History of Present Illness 88y male with 2 week history of SOB and cough, fever/chills, and B LE edema. Diagnosed with CHF. PMH HOH, hypersomnia, HTN, PD     PT Comments    Patient progressing slowly. Tolerated sitting EOB for ~10 mins working on balance, postural activation and anterior weight shift. Sp02 ranged from 87-92% on 3L/min 02. Pt with heavy posterior lean and bias and required Mod A-Min guard assist (for short periods with UE support) to maintain balance.  Pt fatigued quickly and demonstrated 2-3/4 DOE. Wife present and eager for pt to get to rehab. Will follow.   Follow Up Recommendations  SNF;Supervision/Assistance - 24 hour     Equipment Recommendations  None recommended by PT    Recommendations for Other Services       Precautions / Restrictions Precautions Precautions: Fall Precaution Comments: watch SpO2  Restrictions Weight Bearing Restrictions: No    Mobility  Bed Mobility Overal bed mobility: Needs Assistance Bed Mobility: Supine to Sit;Sit to Supine     Supine to sit: Max assist;HOB elevated Sit to supine: Max assist;HOB elevated;+2 for physical assistance   General bed mobility comments: Able to initiate movement of BLEs but needs assist to move them off the bed, elevate trunk and scoot bottom to EOB; assist to bring LEs into bed and lower trunk to return to supine.  Transfers Overall transfer level: Needs assistance Equipment used: Rolling walker (2 wheeled) Transfers: Sit to/from Stand           General transfer comment: Attempted to stand but pt unable to shift into anterior pelvic tilt to progress towards standing despite max cues.   Ambulation/Gait             General Gait Details: Unable   Stairs             Wheelchair Mobility    Modified Rankin (Stroke Patients Only)       Balance  Overall balance assessment: Needs assistance Sitting-balance support: Feet supported;Bilateral upper extremity supported Sitting balance-Leahy Scale: Poor Sitting balance - Comments: Requires Mod A-Min guard assist (with BUEs supported on RW) to maintain balance; able to weight shift forward with assist and cues. Worked on facilitating upright posture and activating spinal extensors but not able to sustain. Heavy posterior lean and bias.  Postural control: Posterior lean                                  Cognition Arousal/Alertness: Awake/alert Behavior During Therapy: Flat affect Overall Cognitive Status: History of cognitive impairments - at baseline                                 General Comments: history of dementia at baseline; Able to follow simple commands inconsistently with repetition and increased time.       Exercises General Exercises - Lower Extremity Quad Sets: Both;5 reps;Supine Long Arc Quad: Both;5 reps;Seated    General Comments General comments (skin integrity, edema, etc.): Wife present during session. Sp02 ranged from 87-92% on 3L/min 02.      Pertinent Vitals/Pain Pain Assessment: Faces Faces Pain Scale: No hurt    Home Living  Prior Function            PT Goals (current goals can now be found in the care plan section) Progress towards PT goals: Progressing toward goals(slowly)    Frequency    Min 2X/week      PT Plan Current plan remains appropriate    Co-evaluation              AM-PAC PT "6 Clicks" Daily Activity  Outcome Measure  Difficulty turning over in bed (including adjusting bedclothes, sheets and blankets)?: Unable Difficulty moving from lying on back to sitting on the side of the bed? : Unable Difficulty sitting down on and standing up from a chair with arms (e.g., wheelchair, bedside commode, etc,.)?: Unable Help needed moving to and from a bed to chair (including a  wheelchair)?: Total Help needed walking in hospital room?: Total Help needed climbing 3-5 steps with a railing? : Total 6 Click Score: 6    End of Session Equipment Utilized During Treatment: Gait belt Activity Tolerance: Patient limited by fatigue Patient left: in bed;with call bell/phone within reach;with bed alarm set;with family/visitor present Nurse Communication: Mobility status;Need for lift equipment PT Visit Diagnosis: Unsteadiness on feet (R26.81);Muscle weakness (generalized) (M62.81);Difficulty in walking, not elsewhere classified (R26.2);History of falling (Z91.81)     Time: [id number] PT Time Calculation (min) (ACUTE ONLY): 23 min  Charges:  $Therapeutic Activity: 8-22 mins $Neuromuscular Re-education: 8-22 mins                     Mylo RedShauna Clydia Nieves, PT, DPT Acute Rehabilitation Services Pager [phone] Office [phone]       Blake DivineShauna A Lanier EnsignHartshorne 07/13/2018, 4:04 PM

## 2018-07-13 NOTE — Progress Notes (Signed)
Patient ID: Randy Murphy, male   DOB: 1932-06-26, 84 y.o.   MRN: [id number]  PROGRESS NOTE    Randy Murphy  Randy Murphy DOB: 10-18-1931 DOA: 07/09/2018 PCP: Rodrigo Ran, MD   Brief Narrative:  84 year old male with history of dementia, Parkinson's disease, hypertension presented with worsening cough with fever and chills despite being treated with Levaquin for a week by his primary care provider.  He was admitted for CHF exacerbation along with pneumonia and started on IV antibiotics and Lasix.  Cardiology consulted.  07/13/2018: Patient seen.  No new changes.  Patient is chronically ill.  Possible DC to skilled nursing facility with rehab in the morning.   Assessment & Plan:   Active Problems:   Congestive heart failure (CHF) (HCC)  Acute combined systolic and diastolic CHF/severe pulmonary hypertension -Patient does not have a history of heart failure -Echo showed EF of 30 to 35% with diffuse hypokinesis and grade 2 diastolic dysfunction with moderate aortic regurgitation and mild to moderate mitral regurgitation and moderate tricuspid regurgitation -Cardiology following.  Continue intravenous Lasix.  Strict input and output.  Daily weights.  - -7.2 L since admission.  Lower extremity duplex was negative for DVT -Tapered off Florinef which was contributing to increased intravascular volume.  However as per discussion with PCP, patient had severe orthostatic hypotension due to advanced Parkinson's and managing off of Florinef may be difficult but not an issue if not ambulating much. -Clinically improved.  As per cardiology, continue IV Lasix today and potentially change to oral Lasix tomorrow. 07/13/2018: Possible DC to skilled nursing facility rehab in the morning.  New PAF Noted as per cardiology.  Beta-blockers avoided because of underlying conduction disease.  Reported frequent falls and may not be a long-term safe anticoagulation candidate.  Mildly positive troponins -Probably  secondary to above.  No current chest pain.  Cardiology on board.  Suspect demand ischemia.  Recent treatment for community acquired pneumonia -Patient was treated with Levaquin for a week by primary care provider recently.  Doubt that he still has bacterial pneumonia.  Patient was started on Rocephin and Zithromax as well on admission.  Antibiotics were discontinued.  Hypokalemia Replace and follow.  Magnesium normal.  Parkinson's disease and dementia along with dysautonomia -Continue Sinemet -Florinef now tapered off.  May have issues with orthostatic hypotension as outlined above.  May consider midodrine in the future but that too can cause fluid retention.  Generalized deconditioning -PT recommends SNF placement.  Social worker consulted and working on it.  Acute kidney injury on possible stage 3 CKD. Likely due to aggressive diuresis.  Creatinine has improved from 1.42 yesterday to 1.35.  Follow BMP in a.m.  DVT prophylaxis: Lovenox Code Status: Full Family Communication: Discussed in detail with patient's spouse at bedside.  Updated care and answered questions.  I also discussed and updated patient's PCP. Disposition Plan: Probably discharge to SNF in 24 to 48 hours. Consultants: Cardiology  Procedures:  Echo on 07/10/2018 Study Conclusions  - Left ventricle: The cavity size was normal. Wall thickness was   increased in a pattern of mild LVH. Systolic function was   moderately reduced. The estimated ejection fraction was in the   range of 35% to 40%. Diffuse hypokinesis. Features are consistent   with a pseudonormal left ventricular filling pattern, with   concomitant abnormal relaxation and increased filling pressure   (grade 2 diastolic dysfunction). - Aortic valve: There was no stenosis. There was moderate   regurgitation. - Mitral valve: Mildly  calcified annulus. There was mild to   moderate regurgitation. - Left atrium: The atrium was severely dilated. - Right  ventricle: The cavity size was normal. Systolic function   was normal. - Right atrium: The atrium was mildly dilated. - Tricuspid valve: There was moderate regurgitation. Peak RV-RA   gradient (S): 66 mm Hg. - Pulmonary arteries: PA peak pressure: 81 mm Hg (S). - Systemic veins: IVC measured 3 cm with < 50% respirophasic   variation, suggesting RA pressure 15 mmHg. - Pericardium, extracardiac: A trivial pericardial effusion was   identified. There was a pericardial effusion.  Impressions:  - Normal LV size with mild LV hypertrophy. EF 35-40%, diffuse   hypokinesis. Moderate diastolic dysfunction. Normal RV size and   systolic function. Severe pulmonary hypertension. Dilated IVC   suggestive of elevated RV filling pressure. Mild to moderate MR.   Moderate AI.  Bilateral lower extremity duplex on 07/10/2018: No DVT  Antimicrobials: Rocephin and Zithromax from 07/09/2018 onwards   Subjective: No new complaints. Looks chronically ill.  Objective: Vitals:   07/13/18 0408 07/13/18 0734 07/13/18 0913 07/13/18 1229  BP: (!) 141/64 (!) 150/81 106/65 127/71  Pulse: 68 77 77 75  Resp: 20 (!) 24 20 18   Temp: (!) 97.5 F (36.4 C) 97.7 F (36.5 C) 99.1 F (37.3 C) 99.2 F (37.3 C)  TempSrc: Oral Oral Oral Oral  SpO2: 93% 98% 99% 93%  Weight: 81.7 kg     Height:        Intake/Output Summary (Last 24 hours) at 07/13/2018 1613 Last data filed at 07/13/2018 1533 Gross per 24 hour  Intake 340 ml  Output 2175 ml  Net -1835 ml   Filed Weights   07/11/18 1220 07/12/18 0534 07/13/18 0408  Weight: 83.1 kg 82.5 kg 81.7 kg    Examination:  General exam: Pleasant elderly male, moderately built and nourished, chronically looking lying propped up in bed and in no obvious distress. Respiratory system: Occasional basal crackles but otherwise clear to auscultation.  No increased work of breathing. Cardiovascular system: S1 and S2 heard, RRR.  No JVD.  2 x 6 systolic murmur at apex.   Trace bilateral ankle edema.  Telemetry personally reviewed: PAF/BBB morphology. Gastrointestinal system: Abdomen is nondistended, soft and nontender. Normal bowel sounds heard. Extremities: No cyanosis; trace edema, improved since yesterday CNS: Sleeping but easily aroused and oriented to self and partly to place.  No focal deficits.  Data Reviewed: I have personally reviewed following labs and imaging studies  CBC: Recent Labs  Lab 07/09/18 1259 07/11/18 0518 07/12/18 0356  WBC 6.9 7.2 6.8  NEUTROABS 5.3 4.6 3.8  HGB 13.3 13.1 14.1  HCT 44.8 41.7 44.7  MCV 101.4* 98.3 98.2  PLT 251 205 223   Basic Metabolic Panel: Recent Labs  Lab 07/09/18 1259 07/10/18 0724 07/11/18 0518 07/12/18 0356 07/13/18 0242  NA 142 145 141 142 141  K 3.4* 2.9* 3.1* 3.3* 4.6  CL 107 109 103 102 100  CO2 27 28 30 31 31   GLUCOSE 126* 87 88 87 85  BUN 22 21 20 20  29*  CREATININE 1.06 1.23 1.42* 1.35* 1.29*  CALCIUM 9.1 8.5* 8.4* 9.2 8.7*  MG  --  2.0 1.9 2.0  --    GFR: Estimated Creatinine Clearance: 45.4 mL/min (A) (by C-G formula based on SCr of 1.29 mg/dL (H)).  Cardiac Enzymes: Recent Labs  Lab 07/09/18 1826 07/09/18 2025 07/10/18 0724  TROPONINI 0.06* 0.08* 0.08*   Thyroid Function Tests: No  results for input(s): TSH, T4TOTAL, FREET4, T3FREE, THYROIDAB in the last 72 hours.   Recent Results (from the past 240 hour(s))  Culture, blood (routine x 2) Call MD if unable to obtain prior to antibiotics being given     Status: None (Preliminary result)   Collection Time: 07/09/18  6:15 PM  Result Value Ref Range Status   Specimen Description BLOOD RIGHT ANTECUBITAL  Final   Special Requests   Final    BOTTLES DRAWN AEROBIC AND ANAEROBIC Blood Culture adequate volume   Culture   Final    NO GROWTH 4 DAYS Performed at [facility], [address], [place], [place] [address]    Report Status PENDING  Incomplete  Culture, blood (routine x 2) Call MD if unable to obtain prior to  antibiotics being given     Status: None (Preliminary result)   Collection Time: 07/09/18  6:30 PM  Result Value Ref Range Status   Specimen Description BLOOD RIGHT HAND  Final   Special Requests   Final    BOTTLES DRAWN AEROBIC AND ANAEROBIC Blood Culture adequate volume   Culture   Final    NO GROWTH 4 DAYS Performed at [facility], [address] [place], [place] [address]    Report Status PENDING  Incomplete         Radiology Studies: No results found.      Scheduled Meds: . carbidopa-levodopa  1 tablet Oral QHS  . Carbidopa-Levodopa ER  2 tablet Oral 4 times per day  . clonazePAM  0.5 mg Oral QHS  . finasteride  5 mg Oral Daily  . furosemide  40 mg Oral Daily  . sodium chloride flush  3 mL Intravenous Q12H   Continuous Infusions: . sodium chloride       LOS: 4 days     Barnetta Chapel, MD. [facility] Pager [phone].  If 7PM-7AM, please contact night-coverage [email or web address] Password [facility] 07/13/2018, 4:13 PM

## 2018-07-13 NOTE — Progress Notes (Signed)
Progress Note  Patient Name: Randy Murphy Date of Encounter: 07/13/2018  Primary Cardiologist: Donato Schultz, MD   Subjective   Resting fairly comfortably in bed.  Edema has resolved.  Decreased work of breathing.  Inpatient Medications    Scheduled Meds: . carbidopa-levodopa  1 tablet Oral QHS  . Carbidopa-Levodopa ER  2 tablet Oral 4 times per day  . clonazePAM  0.5 mg Oral QHS  . finasteride  5 mg Oral Daily  . furosemide  40 mg Intravenous BID  . sodium chloride flush  3 mL Intravenous Q12H   Continuous Infusions: . sodium chloride     PRN Meds: sodium chloride, acetaminophen **OR** acetaminophen, hydrALAZINE, HYDROcodone-acetaminophen, ondansetron **OR** ondansetron (ZOFRAN) IV, sodium chloride flush   Vital Signs    Vitals:   07/12/18 2011 07/13/18 0408 07/13/18 0734 07/13/18 0913  BP: 136/63 (!) 141/64 (!) 150/81 106/65  Pulse: 71 68 77 77  Resp: (!) 23 20 (!) 24 20  Temp: 98.6 F (37 C) (!) 97.5 F (36.4 C) 97.7 F (36.5 C) 99.1 F (37.3 C)  TempSrc: Oral Oral Oral Oral  SpO2: 99% 93% 98% 99%  Weight:  81.7 kg    Height:        Intake/Output Summary (Last 24 hours) at 07/13/2018 1106 Last data filed at 07/13/2018 1050 Gross per 24 hour  Intake 460 ml  Output 1600 ml  Net -1140 ml   Filed Weights   07/11/18 1220 07/12/18 0534 07/13/18 0408  Weight: 83.1 kg 82.5 kg 81.7 kg    Physical Exam   GEN: No acute distress.  Comfortable in bed Neck: No JVD Cardiac: RRR occasional ectopy, no murmurs, rubs, or gallops.  Respiratory: Clear to auscultation bilaterally. GI: Soft, nontender, non-distended  MS: No edema; No deformity. Neuro:  Nonfocal  Psych: Normal affect   Labs    Chemistry Recent Labs  Lab 07/11/18 0518 07/12/18 0356 07/13/18 0242  NA 141 142 141  K 3.1* 3.3* 4.6  CL 103 102 100  CO2 30 31 31   GLUCOSE 88 87 85  BUN 20 20 29*  CREATININE 1.42* 1.35* 1.29*  CALCIUM 8.4* 9.2 8.7*  GFRNONAA 44* 47* 49*  GFRAA 51* 54* 57*    ANIONGAP 8 9 10      Hematology Recent Labs  Lab 07/09/18 1259 07/11/18 0518 07/12/18 0356  WBC 6.9 7.2 6.8  RBC 4.42 4.24 4.55  HGB 13.3 13.1 14.1  HCT 44.8 41.7 44.7  MCV 101.4* 98.3 98.2  MCH 30.1 30.9 31.0  MCHC 29.7* 31.4 31.5  RDW 14.5 14.1 13.5  PLT 251 205 223    Cardiac Enzymes Recent Labs  Lab 07/09/18 1826 07/09/18 2025 07/10/18 0724  TROPONINI 0.06* 0.08* 0.08*    Recent Labs  Lab 07/09/18 1309  TROPIPOC 0.03     BNP Recent Labs  Lab 07/09/18 1259  BNP 752.0*     DDimer No results for input(s): DDIMER in the last 168 hours.   Radiology    No results found.  Cardiac Studies   EF 35%  Patient Profile     84 y.o. male with acute on chronic systolic heart failure left bundle branch block orthostatic hypotension with dysautonomia secondary to Parkinson's disease  Assessment & Plan    Acute on chronic systolic heart failure - I think today we can transition over to p.o. Lasix.  I will write for Lasix 40 mg once a day.  Parkinson's - Sinemet  Demand ischemia - Low level troponin  flat.  Not ACS.  Dysautonomia - He is now off of the Florinef.  This will improve his intravascular volume status.  I think this will be a fine balancing act with labile blood pressure secondary to his Parkinson's in the setting of his recent fluid overload.  Possible paroxysmal atrial fibrillation - No anticoagulation given his current underlying frequent falls parkinson, dementia  If stable, likely DC tomorrow.      For questions or updates, please contact [facility] Please consult [email or web address] for contact info under        Signed, Donato SchultzMark Skains, MD  07/13/2018, 11:06 AM

## 2018-07-13 NOTE — Clinical Social Work Note (Signed)
Insurance authorization approved. SNF can accept patient tomorrow.  Charlynn CourtSarah Martiza Speth, CSW [phone]

## 2018-07-14 ENCOUNTER — Telehealth: Payer: Self-pay | Admitting: Physician Assistant

## 2018-07-14 DIAGNOSIS — N179 Acute kidney failure, unspecified: Secondary | ICD-10-CM

## 2018-07-14 DIAGNOSIS — I5023 Acute on chronic systolic (congestive) heart failure: Secondary | ICD-10-CM

## 2018-07-14 DIAGNOSIS — N183 Chronic kidney disease, stage 3 (moderate): Secondary | ICD-10-CM

## 2018-07-14 DIAGNOSIS — I4891 Unspecified atrial fibrillation: Secondary | ICD-10-CM

## 2018-07-14 DIAGNOSIS — E876 Hypokalemia: Secondary | ICD-10-CM

## 2018-07-14 LAB — BASIC METABOLIC PANEL
Anion gap: 10 (ref 5–15)
BUN: 31 mg/dL — ABNORMAL HIGH (ref 8–23)
CO2: 32 mmol/L (ref 22–32)
Calcium: 9 mg/dL (ref 8.9–10.3)
Chloride: 99 mmol/L (ref 98–111)
Creatinine, Ser: 1.39 mg/dL — ABNORMAL HIGH (ref 0.61–1.24)
GFR calc Af Amer: 52 mL/min — ABNORMAL LOW (ref >60)
GFR calc non Af Amer: 45 mL/min — ABNORMAL LOW (ref >60)
Glucose, Bld: 91 mg/dL (ref 70–99)
Potassium: 3.6 mmol/L (ref 3.5–5.1)
Sodium: 141 mmol/L (ref 135–145)

## 2018-07-14 LAB — CULTURE, BLOOD (ROUTINE X 2)
Culture: NO GROWTH
Culture: NO GROWTH
Special Requests: ADEQUATE
Special Requests: ADEQUATE

## 2018-07-14 MED ORDER — CLONAZEPAM 0.5 MG PO TABS: 0.5000 mg | ORAL_TABLET | Freq: Every day | ORAL | 1 refills | Status: AC

## 2018-07-14 MED ORDER — FUROSEMIDE 40 MG PO TABS: 40.0000 mg | ORAL_TABLET | Freq: Every day | ORAL | 0 refills | Status: DC

## 2018-07-14 NOTE — Discharge Summary (Signed)
Physician Discharge Summary  Patient ID: Randy HockDonald H Parson MRN: [id number] DOB/AGE: 82/11/1931 82 y.o.  Admit date: 07/09/2018 Discharge date: 07/14/2018  Admission Diagnoses:  Discharge Diagnoses:  Acute on chronic combined systolic and diastolic congestive heart failure (CHF) (HCC) New onset paroxysmal atrial fibrillation. Mildly positive troponins. Recent treatment for community-acquired pneumonia. Hypokalemia. Parkinson's disease. Dementia. Dysautonomia. Generalized deconditioning. Possible acute kidney injury on chronic kidney disease stage III  Discharged Condition: stable  Hospital Course: Patient is an 84 year old male with past medical history significant for dementia, Parkinson's disease and hypertension.  As per prior documentation, patient presented with worsening cough, fever and chills, despite being treated with Levaquin for a week by his primary care provider.  Patient was admitted with CHF exacerbation and possible pneumonia.  Patient was initially started on IV antibiotics and Lasix, however, antibiotics was later discontinued as pneumonia was thought to be less likely.    During the hospital stay, the patient was also noted to have new onset of atrial fibrillation.  Cardiology team was consulted to assist with patient's management.   Acute combined systolic and diastolic CHF/severe pulmonary hypertension -Patient does not have a history of heart failure -Echo showed EF of 30 to 35% with diffuse hypokinesis and grade 2 diastolic dysfunction with moderate aortic regurgitation and mild to moderate mitral regurgitation and moderate tricuspid regurgitation -Cardiology was consulted.  Patient was initially managed with IV Lasix.  Patient was with daily, and strict input and input was followed.   -Tapered off Florinef which was contributing to increased intravascular volume.  However as per discussion with PCP, patient had severe orthostatic hypotension due to advanced  Parkinson's and managing off of Florinef may be difficult but not an issue if not ambulating much.  Kindly continue to monitor closely. -Patient has clinically improved.  Patient will be discharged to skilled nursing facility for rehab.   -Patient will follow with the cardiology team and primary care provider within 1 week of discharge.   -Continue to monitor electrolytes and renal function closely.   New PAF -Beta-blockers avoided because of underlying conduction disease.   -Patient reported frequent falls and may not be a long-term safe anticoagulation candidate. -Lower extremity duplex was negative for DVT  Mildly positive troponins - Probably secondary to above.   - No current chest pain.   - Cardiology on board.   - Possibly type II troponin elevation/MI.    Recent treatment for community acquired pneumonia -Patient was treated with Levaquin for a week by primary care provider recently.   -As per prior documentation, there is doubt that patient still has bacterial pneumonia.   -Patient was initially on IV Rocephin and Zithromax, but antibiotics were discontinued.   -There was doubt that the patient had bacterial pneumonia, therefore, patient was monitored off antibiotics.  -Patient has remained stable.    Hypokalemia Potassium was replaced during the hospital stay.   Continue to monitor renal function, electrolytes and magnesium.    Parkinson's disease and dementia along with dysautonomia -Continue Sinemet -Florinef now tapered off.   - May have issues with orthostatic hypotension, therefore, continue to monitor closely.    Generalized deconditioning -Physical therapy team has recommended skilled nursing facility placement/rehabilitation.   -Patient has remained stable, will be discharged to skilled nursing facility.    Acute kidney injury on possible stage 3 CKD. Continue to monitor renal function closely.    Consults: cardiology  Significant Diagnostic Studies:    Discharge Exam: Blood pressure (!) 150/75, pulse 67, temperature 98.2  F (36.8 C), temperature source Oral, resp. rate 18, height 5\' 11"  (1.803 m), weight 78.1 kg, SpO2 94 %.   Disposition: Discharge disposition: 03-Skilled Nursing Facility   Discharge Instructions    Diet - low sodium heart healthy   Complete by:  As directed    Increase activity slowly   Complete by:  As directed      Allergies as of 07/14/2018   No Known Allergies     Medication List    STOP taking these medications   acetaminophen 500 MG tablet Commonly known as:  TYLENOL   ALIGN PO   Co Q 10 100 MG Caps   donepezil 10 MG tablet Commonly known as:  ARICEPT   FLAX SEED OIL PO   fludrocortisone 0.1 MG tablet Commonly known as:  FLORINEF   guaiFENesin 600 MG 12 hr tablet Commonly known as:  MUCINEX   levofloxacin 500 MG tablet Commonly known as:  LEVAQUIN   MAGNESIUM PO     TAKE these medications   Carbidopa-Levodopa ER 25-100 MG tablet controlled release Commonly known as:  SINEMET CR Take 2 tablets by mouth 4 (four) times daily.   carbidopa-levodopa 50-200 MG tablet Commonly known as:  SINEMET CR TAKE 1 TABLET BY MOUTH AT  BEDTIME   chlorhexidine 0.12 % solution Commonly known as:  PERIDEX 15 mLs by Mouth Rinse route 2 (two) times daily. Rinse and spit   clonazePAM 0.5 MG tablet Commonly known as:  KLONOPIN TAKE 1 TABLET BY MOUTH AT  BEDTIME   finasteride 5 MG tablet Commonly known as:  PROSCAR Take 5 mg by mouth daily.   furosemide 40 MG tablet Commonly known as:  LASIX Take 1 tablet (40 mg total) by mouth daily. Start taking on:  07/15/2018   ibandronate 150 MG tablet Commonly known as:  BONIVA Take 150 mg by mouth every 30 (thirty) days. Take in the morning with a full glass of water, on an empty stomach, and do not take anything else by mouth or lie down for the next 30 min.   MULTIVITAMIN PO Take 1 tablet by mouth daily. What changed:  Another medication with  the same name was removed. Continue taking this medication, and follow the directions you see here.   TURMERIC PO Take 1,000 mg by mouth at bedtime.   VITAMIN D PO Take by mouth.       Contact information for follow-up providers    Dunn, Kriste Basque N, PA-C Follow up.   Specialties:  Cardiology, Radiology Why:  [facility] - 07/21/18 at 11am. Please arrive 15 minutes prior to appointment time to check in. Kriste Basque is one of the PAs that works with the cardiology team and helped care for you in the hospital. Contact information: [address] [place] [address] [phone]            Contact information for after-discharge care    Destination    [facility] Preferred SNF .   Service:  Skilled Nursing Contact information: [address] [place] [address] [phone]                 Time spent discharging patient -32 minutes.  SignedBarnetta Chapel 07/14/2018, 1:48 PM

## 2018-07-14 NOTE — Clinical Social Work Note (Addendum)
Patient's wife is completing SNF admissions paperwork in the room.  Charlynn CourtSarah Lliam Hoh, CSW [phone]  1:44 pm Faxed completed admissions paperwork to SNF.  Charlynn CourtSarah Theodore Rahrig, CSW [phone]

## 2018-07-14 NOTE — Progress Notes (Addendum)
Progress Note  Patient Name: Randy Murphy Date of Encounter: 07/14/2018  Primary Cardiologist: Donato Schultz, MD  Subjective   Resting quietly without labored breathing. Edema resolved. Wife says he said he was feeling pretty good this morning.  Inpatient Medications    Scheduled Meds: . carbidopa-levodopa  1 tablet Oral QHS  . Carbidopa-Levodopa ER  2 tablet Oral 4 times per day  . clonazePAM  0.5 mg Oral QHS  . finasteride  5 mg Oral Daily  . furosemide  40 mg Oral Daily  . sodium chloride flush  3 mL Intravenous Q12H   Continuous Infusions: . sodium chloride     PRN Meds: sodium chloride, acetaminophen **OR** acetaminophen, hydrALAZINE, HYDROcodone-acetaminophen, ondansetron **OR** ondansetron (ZOFRAN) IV, sodium chloride flush   Vital Signs    Vitals:   07/13/18 1631 07/13/18 2000 07/14/18 0404 07/14/18 0409  BP: 137/64 124/68 (!) 150/75   Pulse: 74 77 67   Resp: 20 18 18    Temp: 98.6 F (37 C) (!) 97.5 F (36.4 C) 98.2 F (36.8 C)   TempSrc: Oral Oral Oral   SpO2: 97% 94% 94%   Weight:    78.3 kg  Height:        Intake/Output Summary (Last 24 hours) at 07/14/2018 0835 Last data filed at 07/14/2018 0650 Gross per 24 hour  Intake 220 ml  Output 1525 ml  Net -1305 ml   Filed Weights   07/12/18 0534 07/13/18 0408 07/14/18 0409  Weight: 82.5 kg 81.7 kg 78.3 kg    Telemetry    NSR - Personally Reviewed  Physical Exam   GEN: No acute distress, somewhat frail appearing WM in NAD  HEENT: Normocephalic, atraumatic, sclera non-icteric. Neck: No JVD or bruits. Cardiac: RRR no murmurs, rubs, or gallops.  Radials/DP/PT 1+ and equal bilaterally.  Respiratory: Clear to auscultation bilaterally. Breathing is unlabored. GI: Soft, nontender, non-distended, BS +x 4. MS: no deformity. Extremities: No clubbing or cyanosis. No edema. Distal pedal pulses are 2+ and equal bilaterally. Neuro:  AAOx3. Follows commands. Psych:  Responds to questions appropriately  with a normal affect.  Labs    Chemistry Recent Labs  Lab 07/12/18 0356 07/13/18 0242 07/14/18 0546  NA 142 141 141  K 3.3* 4.6 3.6  CL 102 100 99  CO2 31 31 32  GLUCOSE 87 85 91  BUN 20 29* 31*  CREATININE 1.35* 1.29* 1.39*  CALCIUM 9.2 8.7* 9.0  GFRNONAA 47* 49* 45*  GFRAA 54* 57* 52*  ANIONGAP 9 10 10      Hematology Recent Labs  Lab 07/09/18 1259 07/11/18 0518 07/12/18 0356  WBC 6.9 7.2 6.8  RBC 4.42 4.24 4.55  HGB 13.3 13.1 14.1  HCT 44.8 41.7 44.7  MCV 101.4* 98.3 98.2  MCH 30.1 30.9 31.0  MCHC 29.7* 31.4 31.5  RDW 14.5 14.1 13.5  PLT 251 205 223    Cardiac Enzymes Recent Labs  Lab 07/09/18 1826 07/09/18 2025 07/10/18 0724  TROPONINI 0.06* 0.08* 0.08*    Recent Labs  Lab 07/09/18 1309  TROPIPOC 0.03     BNP Recent Labs  Lab 07/09/18 1259  BNP 752.0*     DDimer No results for input(s): DDIMER in the last 168 hours.   Radiology    No results found.  Cardiac Studies   2d echo 07/10/18 Study Conclusions - Left ventricle: The cavity size was normal. Wall thickness was   increased in a pattern of mild LVH. Systolic function was   moderately reduced. The  estimated ejection fraction was in the   range of 35% to 40%. Diffuse hypokinesis. Features are consistent   with a pseudonormal left ventricular filling pattern, with   concomitant abnormal relaxation and increased filling pressure   (grade 2 diastolic dysfunction). - Aortic valve: There was no stenosis. There was moderate   regurgitation. - Mitral valve: Mildly calcified annulus. There was mild to   moderate regurgitation. - Left atrium: The atrium was severely dilated. - Right ventricle: The cavity size was normal. Systolic function   was normal. - Right atrium: The atrium was mildly dilated. - Tricuspid valve: There was moderate regurgitation. Peak RV-RA   gradient (S): 66 mm Hg. - Pulmonary arteries: PA peak pressure: 81 mm Hg (S). - Systemic veins: IVC measured 3 cm with <  50% respirophasic   variation, suggesting RA pressure 15 mmHg. - Pericardium, extracardiac: A trivial pericardial effusion was   identified. There was a pericardial effusion. Impressions: - Normal LV size with mild LV hypertrophy. EF 35-40%, diffuse   hypokinesis. Moderate diastolic dysfunction. Normal RV size and   systolic function. Severe pulmonary hypertension. Dilated IVC   suggestive of elevated RV filling pressure. Mild to moderate MR.   Moderate AI.  Patient Profile     84 y.o. 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 Initially treated for CAP but more likely felt due to CHF.  Assessment & Plan    1.Shortness of breath/edema likely due to acute combined CHF with severe pulm HTN, also mild-mod MR, mod AI - Florinef discontinued. Weight 192->172, - 9.2L. Given how different weight is today compared to yesterday I asked for re-weigh to ensure validity - confirmed at 172lb (78.1kg). Cr now hovering 1.3-1.4 range which is likely what it will require to maintain euvolemia. Continue oral Lasix. He is not a good candidate for beta blocker given intermittent bradycardia this admission. BP labile in chart (down to 06/04/64 yesterday) so will finalize plans for afterload reduction with MD. May need to see how things go in OP setting off Florinef - even with this med on board, patient was falling up to 3x/week at home. Would suggest that discharge summary include recommendations for 2000mg  max sodium diet and 2L fluid restriction.  2. Newly recognized paroxysmal atrial fib (rate controlled), intermittent junctional beats, and 1 episode noted of 2nd degree type 1 AV block-initially dx was unclear, but atrial fib re-emerged at a lower rate and was  more clearly defined. Rate was controlled. He is not a candidate for anticoagulation due to frequent falls.   3. LBBB -unclear chronicity. Some conduction disease noted on tele as above. Monitor conservatively. No current indication for PPM.  4. Elevated troponin -suspect demand ischemia. LV dysfunction was out of proportion to mildly elevated troponin, so not suspected to be ACS.  5. H/o orthostatic hypotension - follow off Florinef.  6. Parkinson's disease -per IM.  7. Hypokalemia - repleted by primary team.  I anticipate [facility] will sign off.   Medication Recommendations:  Pending final clarification with MD. Other recommendations (labs, testing, etc):  Can consider OP BMET in follow-up Follow up as an outpatient:  07/21/18 with me at 11am  For questions or updates, please contact [facility] Please consult [email or web address] for contact info under Cardiology/STEMI.  Signed, Laurann Montana, PA-C 07/14/2018, 8:35 AM    Personally seen and examined. Agree with above.  Breathing comfortably.  Smiling. Alert.  Parkinson's.  Regular rate and rhythm, no edema  Assessment and plan:  Acute on chronic diastolic/systolic heart failure Parkinson's disease Orthostatic hypotension - We have tapered off his fludrocortisone - Continue with oral Lasix 40 mg a day. - Outpatient follow-up as above. -Not a candidate for anticoagulation.  Okay for discharge from our perspective.  Donato Schultz, MD

## 2018-07-14 NOTE — Clinical Social Work Note (Signed)
CSW facilitated patient discharge including contacting patient family and facility to confirm patient discharge plans. Clinical information faxed to facility and family agreeable with plan. CSW arranged ambulance transport via [name]. RN to call report prior to discharge [phone] Room 38).  CSW will sign off for now as social work intervention is no longer needed. Please consult us again if new needs arise.  Charlynn CourtSarah Lorriane Dehart, CSW [phone]

## 2018-07-14 NOTE — Clinical Social Work Placement (Signed)
   CLINICAL SOCIAL WORK PLACEMENT  NOTE  Date:  07/14/2018  Patient Details  Name: Randy Murphy MRN: [id number] Date of Birth: 02/05/1932  Clinical Social Work is seeking post-discharge placement for this patient at the Skilled  Nursing Facility level of care (*CSW will initial, date and re-position this form in  chart as items are completed):  Yes   Patient/family provided with [facility] Clinical Social Work Department's list of facilities offering this level of care within the geographic area requested by the patient (or if unable, by the patient's family).  Yes   Patient/family informed of their freedom to choose among providers that offer the needed level of care, that participate in Medicare, Medicaid or managed care program needed by the patient, have an available bed and are willing to accept the patient.  Yes   Patient/family informed of [facility]'s ownership interest in [facility] and [facility], as well as of the fact that they are under no obligation to receive care at these facilities.  PASRR submitted to EDS on 07/11/18     PASRR number received on 07/11/18     Existing PASRR number confirmed on       FL2 transmitted to all facilities in geographic area requested by pt/family on 07/11/18     FL2 transmitted to all facilities within larger geographic area on       Patient informed that his/her managed care company has contracts with or will negotiate with certain facilities, including the following:        Yes   Patient/family informed of bed offers received.  [facility]     Physician recommends and patient chooses bed at      [facility] 07[facility]14/[facility].  Patient to be transferred to facility by [name]     Patient family notified on 07/14/18 of transfer.  Name of family member notified:  Lacretia Leigharolyn [place]     PHYSICIAN Please prepare prescriptions     Additional Comment:     _______________________________________________ Margarito LinerSarah C Aryam Zhan, LCSW 07/14/2018, 2:22 PM

## 2018-07-14 NOTE — Telephone Encounter (Signed)
New Message   Patient has a TOC scheduled 11/22 with Ronie Spiesayna Dunn.

## 2018-07-14 NOTE — Progress Notes (Signed)
Report called to [facility] facility. All patient belongings wilth patient.  Discharge AVS in package.  Patient demonstrates no s/sx of distress at this time.

## 2018-07-17 NOTE — Telephone Encounter (Signed)
**Note De-Identified Mallerie Blok Obfuscation** The pt was discharged [facility] to a nursing facility per Clinical Social Work note in pts chart on 07/14/18 as follows:  CSW facilitated patient discharge including contacting patient family and facility to confirm patient discharge plans. Clinical information faxed to facility and family agreeable with plan. CSW arranged ambulance transport [name]. RN to call report prior to discharge [phone] Room 38).  TCM Call not needed.

## 2018-07-20 ENCOUNTER — Encounter: Payer: Self-pay | Admitting: Physician Assistant

## 2018-07-20 NOTE — Progress Notes (Addendum)
Cardiology Office Note    Date:  07/21/2018  ID:  Randy HockDonald H Murphy, DOB 09/28/1931, MRN [id number] PCP:  [name], Mark, MD  Cardiologist:  Donato SchultzMark Skains, MD   Chief Complaint: f/u CHF  History of Present Illness:  Randy Murphy is a 84 y.o. male with history of orthostatic hypotension in setting of Parkinson's disease, dementia, HTN, HLD, suspected CKD stage III, frequent falls, recent failure to thrive, newly recognized CHF, PAF, bradycardia (with intermittent junctional beating and 1 episode of 2nd degree type 1 AVB), LBBB of unknown chronicity, and elevated troponin. He was previously on Florinef but this was stopped during recent admission when he was found to be volume overloaded with SOB and swelling. He had recently been treated as OP for PNA; new re-emergence of sx were felt more due to CHF. 2D echo showed EF 35-40%, grade 2 DD, mod AI, mod TR, severe LAE, normal RV, severe pulmonary HTN, trivial pericardial effusoin. Florinef was weaned and he was diuresed. Ischemic eval was not pursued given comorbidities and lack of angina. Minimal troponin elevation was out of proportion to degree of LV dysfunction so ACS was not suspected. Telemetry showed newly recognized PAF (rate controlled),intermittent junctional beats, and 1 episode noted of 2nd degree type 1 AV block. He is not a candidate for anticoagulation due to frequent falls. He was discharged home to SNF. His wife struggled with this decision but she clearly could no longer care for him in the home. Prior to this admission she was calling EMS out to the house frequently for falls. DC Cr 1.39, K 3.6, Mg 2.0, TSH wnl, Hgb 14.1, 02/2018 LFTs wnl.  He returns for follow-up today and looks much more spry than in the hospital. [facility]. He denies any CP, SOB. No further edema. No recent falls. Wife and patient are hopeful for rehabilitation with plan to return home in several weeks. No palpitations or bleeding. [facility]  vitals show BP mainly in 110-150 range but one outlier in the 94 systolic range.    Past Medical History:  Diagnosis Date  . Chronic combined systolic and diastolic CHF (congestive heart failure) (HCC)   . CKD (chronic kidney disease), stage III (HCC)    a. stage II-III by labs, Cr 1.1-1.22 in 09/2017, 1.3-1.4 in 06/2018.  Marland Kitchen. Dementia (HCC)   . Fatigue   . Frequent falls   . Hearing loss of both ears   . History of orthostatic hypotension   . Hyperlipidemia   . Hypersomnia, periodic 02/18/2014  . Hypertension   . Insomnia   . Junctional bradycardia   . Moderate aortic insufficiency   . Osteopenia   . PAF (paroxysmal atrial fibrillation) (HCC)    a. noted in hospital 06/2018, not on anticoag due to h/o frequent falling.  . Parkinson's disease    Possible early parkinsons disease  . Pulmonary hypertension (HCC)   . Sinus bradycardia   . Tricuspid regurgitation     Past Surgical History:  Procedure Laterality Date  . CATARACT EXTRACTION, BILATERAL    . NASAL SEPTUM SURGERY  1972    Current Medications: Current Meds  Medication Sig  . carbidopa-levodopa (SINEMET CR) 50-200 MG tablet TAKE 1 TABLET BY MOUTH AT  BEDTIME  . Carbidopa-Levodopa ER (SINEMET CR) 25-100 MG tablet controlled release Take 2 tablets by mouth 4 (four) times daily.  . chlorhexidine (PERIDEX) 0.12 % solution 15 mLs by Mouth Rinse route 2 (two) times daily. Rinse and spit  .  Cholecalciferol (VITAMIN D PO) Take by mouth.    . clonazePAM (KLONOPIN) 0.5 MG tablet Take 1 tablet (0.5 mg total) by mouth at bedtime.  . Coenzyme Q10 (CO Q 10 PO) Take by mouth daily.  . finasteride (PROSCAR) 5 MG tablet Take 5 mg by mouth daily.    . furosemide (LASIX) 40 MG tablet Take 1 tablet (40 mg total) by mouth daily.  Marland Kitchen ibandronate (BONIVA) 150 MG tablet Take 150 mg by mouth every 30 (thirty) days. Take in the morning with a full glass of water, on an empty stomach, and do not take anything else by mouth or lie down for the  next 30 min.  . Multiple Vitamins-Minerals (MULTIVITAMIN PO) Take 1 tablet by mouth daily.      Allergies:   Patient has no known allergies.   Social History   Socioeconomic History  . Marital status: Married    Spouse name: [name]  . Number of children: 1  . Years of education: 64  . Highest education level: Not on file  Occupational History  . Occupation: retired    Comment: Dealer - Building surveyor  Social Needs  . Financial resource strain: Not on file  . Food insecurity:    Worry: Not on file    Inability: Not on file  . Transportation needs:    Medical: Not on file    Non-medical: Not on file  Tobacco Use  . Smoking status: Never Smoker  . Smokeless tobacco: Never Used  Substance and Sexual Activity  . Alcohol use: No  . Drug use: No  . Sexual activity: Not on file  Lifestyle  . Physical activity:    Days per week: Not on file    Minutes per session: Not on file  . Stress: Not on file  Relationships  . Social connections:    Talks on phone: Not on file    Gets together: Not on file    Attends religious service: Not on file    Active member of club or organization: Not on file    Attends meetings of clubs or organizations: Not on file    Relationship status: Not on file  Other Topics Concern  . Not on file  Social History Narrative   Patient is married [name]) and lives at home with his wife.   Patient has one adult child.   Patient is retired.   Patient has a college education.   Patient is right-handed.   Patient drinks one cup of coffee daily.     Family History:  The patient's family history includes Stroke in his sister.  ROS:   Please see the history of present illness All other systems are reviewed and otherwise negative.    PHYSICAL EXAM:   VS:  BP 110/60   Pulse 62   Ht 5\' 11"  (1.803 m)   Wt 176 lb (79.8 kg)   BMI 24.55 kg/m   BMI: Body mass index is 24.55 kg/m. GEN: Frail appearing elderly WM in no acute  distress HEENT: normocephalic, atraumatic Neck: no JVD, carotid bruits, or masses Cardiac: RRR; no murmurs, rubs, or gallops, no edema  Respiratory:  clear to auscultation bilaterally, normal work of breathing GI: soft, nontender, nondistended, + BS MS: no deformity or atrophy Skin: warm and dry, no rash Neuro:  Alert and Oriented to self - answers questions appropriately, better strength behind voice, Strength and sensation are intact, follows commands Psych: euthymic mood, full affect  Wt Readings from  Last 3 Encounters:  07/21/18 176 lb (79.8 kg)  07/14/18 172 lb 2.9 oz (78.1 kg)  10/10/17 182 lb (82.6 kg)      Studies/Labs Reviewed:   EKG:  EKG was not ordered today  Recent Labs: 03/14/2018: ALT 10 07/09/2018: B Natriuretic Peptide 752.0; TSH 3.757 07/12/2018: Hemoglobin 14.1; Magnesium 2.0; Platelets 223 07/14/2018: BUN 31; Creatinine, Ser 1.39; Potassium 3.6; Sodium 141   Lipid Panel No results found for: CHOL, TRIG, HDL, CHOLHDL, VLDL, LDLCALC, LDLDIRECT  Additional studies/ records that were reviewed today include: Summarized above   ASSESSMENT & PLAN:   1. Chronic combined CHF - appears euvolemic. As suspected, BP is occasionally soft at times. Check BMET today - will use these labs to help decide to scale diuretic back if needed. We will communicate results to [facility]. Reviewed 2g sodium restriction, 2L fluid restriction and included on paperwork. Addendum: he is not on BB due to h/o #3 and not on ACEI/ARB/Spiro due to tendency towards softer BP. Addendum: since labs are stable I had nurse touch base with [facility] for weekend BP today (07/24/18). It has been consistently over 110 so will hold course with current regimen. 2. Paroxysmal atrial fibrillation - currently in NSR on exam. Not a candidate for anticoagulation as above. 3. Bradycardia - stable. No current indication for PPM. 4. Orthostatic hypotension - continue to monitor closely. If this  becomes an issue, consideration should be given to adding midodrine. Florinef would no longer be a feasible option given his CHF.  Disposition: F/u with Dr. Anne Fu in 3 months.  Medication Adjustments/Labs and Tests Ordered: Current medicines are reviewed at length with the patient today.  Concerns regarding medicines are outlined above. Medication changes, Labs and Tests ordered today are summarized above and listed in the Patient Instructions accessible in Encounters.   Signed, Laurann Montana, PA-C  07/21/2018 12:01 PM    [facility] [address] [place], [place], [place]  [address] Phone: [phone]; Fax: [phone]

## 2018-07-21 ENCOUNTER — Ambulatory Visit: Payer: Medicare Other | Admitting: Physician Assistant

## 2018-07-21 ENCOUNTER — Telehealth: Payer: Self-pay | Admitting: Neurology

## 2018-07-21 ENCOUNTER — Encounter: Payer: Self-pay | Admitting: Physician Assistant

## 2018-07-21 VITALS — BP 110/60 | HR 62 | Ht 71.0 in | Wt 176.0 lb

## 2018-07-21 DIAGNOSIS — I5042 Chronic combined systolic (congestive) and diastolic (congestive) heart failure: Secondary | ICD-10-CM | POA: Diagnosis not present

## 2018-07-21 DIAGNOSIS — I480 Paroxysmal atrial fibrillation: Secondary | ICD-10-CM | POA: Diagnosis not present

## 2018-07-21 DIAGNOSIS — R001 Bradycardia, unspecified: Secondary | ICD-10-CM

## 2018-07-21 DIAGNOSIS — I951 Orthostatic hypotension: Secondary | ICD-10-CM | POA: Diagnosis not present

## 2018-07-21 NOTE — Telephone Encounter (Signed)
Patient's wife called and left a vm for you wanting to speak with you about his 12/5 appt. He has been in the hospital recently for a week for his CHF and pneumonia. Please call her back at [phone]. Thanks!

## 2018-07-21 NOTE — Patient Instructions (Addendum)
Medication Instructions:  1. Your physician recommends that you continue on your current medications as directed. Please refer to the Current Medication list given to you today.  If you need a refill on your cardiac medications before your next appointment, please call your pharmacy.   Lab work: TODAY BMET If you have labs (blood work) drawn today and your tests are completely normal, you will receive your results only by: Marland Kitchen. MyChart Message (if you have MyChart) OR . A paper copy in the mail If you have any lab test that is abnormal or we need to change your treatment, we will call you to review the results.  Testing/Procedures: NONE ORDERED TODAY  Follow-Up: At [facility], you and your health needs are our priority.  As part of our continuing mission to provide you with exceptional heart care, we have created designated Provider Care Teams.  These Care Teams include your primary Cardiologist (physician) and Advanced Practice Providers (APPs -  Physician Assistants and Nurse Practitioners) who all work together to provide you with the care you need, when you need it. You will need a follow up appointment in 3 months.   You may see Donato SchultzMark Skains, MD ON 10/20/18 @ 11 AM   Any Other Special Instructions Will Be Listed Below (If Applicable).  For patients with congestive heart failure, we give them these special instructions:  1. Follow a low-salt diet - you are allowed no more than 2,000mg  of sodium per day. Watch your fluid intake. In general, you should not be taking in more than 2 liters of fluid per day (no more than 8 glasses per day). This includes sources of water in foods like soup, coffee, tea, milk, etc. 2. Weigh yourself on the same scale at same time of day and keep a log. 3. Call your doctor: (Anytime you feel any of the following symptoms)  - 3lb weight gain overnight or 5lb within a few days - Shortness of breath, with or without a dry hacking cough  - Swelling in the hands,  feet or stomach  - If you have to sleep on extra pillows at night in order to breathe   IT IS IMPORTANT TO LET YOUR DOCTOR KNOW EARLY ON IF YOU ARE HAVING SYMPTOMS SO WE CAN HELP YOU!

## 2018-07-22 LAB — BASIC METABOLIC PANEL
BUN/Creatinine Ratio: 23 (ref 10–24)
BUN: 27 mg/dL (ref 8–27)
CO2: 28 mmol/L (ref 20–29)
Calcium: 9.4 mg/dL (ref 8.6–10.2)
Chloride: 100 mmol/L (ref 96–106)
Creatinine, Ser: 1.15 mg/dL (ref 0.76–1.27)
GFR calc Af Amer: 67 mL/min/{1.73_m2} (ref >59)
GFR calc non Af Amer: 58 mL/min/{1.73_m2} — ABNORMAL LOW (ref >59)
Glucose: 104 mg/dL — ABNORMAL HIGH (ref 65–99)
Potassium: 3.5 mmol/L (ref 3.5–5.2)
Sodium: 143 mmol/L (ref 134–144)

## 2018-07-24 NOTE — Telephone Encounter (Signed)
Thanks for helping with that.  Yes, she can do that. He has an appt next week

## 2018-07-24 NOTE — Telephone Encounter (Signed)
Spoke with wife and let her know we are sending a new order. Confirmed he was taking dosages at home at 8 am, 1 pm, and 6 pm. Awaiting signature, then will send order.

## 2018-07-24 NOTE — Telephone Encounter (Signed)
Telephone note 04/07/2018.

## 2018-07-24 NOTE — Telephone Encounter (Signed)
I don't see where I changed it to tid??  I do see they d/c the aricept in the hospital

## 2018-07-24 NOTE — Telephone Encounter (Signed)
Spoke with patient's wife.  Patient has been at [facility] for about a week (from the hospital where he had a long stay for CHF/pneumonia).  He is having a lot of hallucinations. They seem to be getting worse. They started in the hospital. They do have him on Carbidopa Levodopa QID, when he was decreased after last visit to [facility] due to side effects.  She wants to know if we can send an order to decrease the dose back to TID to see if it would help hallucinations. Please advise.

## 2018-07-24 NOTE — Telephone Encounter (Signed)
Patient's wife called stating that they are giving him 4 doses of the carbidopa levodopa a day. She thinks they are overdosing him. Changes sent to [facility] [place] room [phone] if that med dosage needs to be changed. Ask for the nurse station. Please call [phone]. Thanks!

## 2018-07-24 NOTE — Telephone Encounter (Signed)
Left message on machine for patient's wife to call back.   

## 2018-07-24 NOTE — Telephone Encounter (Signed)
Patient's wife is calling in stating that he is having violent behaviors and having delusions. He is having confusion and irritated. He is in a home and they think it may be because of medication given from recent hospital visit. Please call her back at [phone]. Thanks!

## 2018-07-24 NOTE — Telephone Encounter (Signed)
[facility] at [phone] with confirmation received.

## 2018-08-01 NOTE — Progress Notes (Signed)
Randy Murphy was seen today in the movement disorders clinic for neurologic consultation at the request of [name].  His PCP is Randy Murphy, Mark, MD.  This patient is accompanied in the office by his wife who supplements the history.The consultation is for the evaluation of PD.  Pt has also previously seen Dr. Vickey Murphy.  I have reviewed neurology records made available to me.  The patient last saw [name] on July 14, 2017.  Records from initial diagnosis are not available.  Patient reports that he was diagnosed in approximately 2011.  His first symptom was falls.  He is currently on carbidopa/levodopa 25/100, 2 tablets four times per day (8am/noon/5pm/3am).  He automatically wakes up at 3am and then takes his medication.  He isn't sure that medication makes a difference (appears that did on/off test and proved efficacy of carbidopa/levodopa at [facility])  10/18/17 update: Patient is seen today in follow-up.  This patient is accompanied in the office by his wife and son who supplements the history.He is on carbidopa/levodopa 25/100, 2.5 tablets 3 times per day and then takes 2.5 tablets in the middle of the night.  Records have been reviewed since last visit.  The patient was in the emergency room on February 8 and October 10, 2017 with falls.  With the first fall, he did have to have sutures.  Reports that he was walking too fast and fell forward and hit his head.  With the second fall, he was using his walker and mis-stepped and had ecchymosis in the emergency room in his left upper extremity and periorbital region.  Reports had 6 falls in one week.  Called PCP and tried to get urine test and told inconclusive for UTI but called in abx on 2/15. He has 2 days left and is markedly better and nearly back to baseline. Had a single hallucination when he wasn't doing well but none since. Son states there has been a dramatic improvement compared to over the weekend where he described patient as "catatonic."   I  sent him for physical therapy last visit and he had an initial screen, but was not able to get into therapy until February 22.  His wife is not sure that she will be able to safely get him there.  03/14/18 update: Patient is seen today in follow-up.  The patient is accompanied by his wife who supplements the history.  Patient is on carbidopa/levodopa 25/100, 2-1/2 tablets 3 times per day (quit taking in the middle of the night dosage).   He is still on carbidopa/levodopa 50/200 at bedtime.  He was given information on wheelchairs last visit and they decided not to pursue this.  They have gotten a stair lift placed in the home since our last visit and found that mobility was better this way.  They have recently called with multiple falls.  He had 6 falls in June and 6 in the first 2 weeks of July.  Records indicated that falls are often in the middle of the night when he gets up to use the bathroom by himself.  Wife indicates today however that falls can be any time of day.  Doesn't seem to be associated with wearing off of medication.  More confusion at late in day.  No hallucination.  When falling, it may be with or without use of the walker.  He has trouble when backing up to the chair to sit and then will fall.  Last home PT was a  month ago.  Was working out at [employer] but can't get there now.  Went to urology and did nerve stim at the ankle and that has helped with bladder issues/nocturia.  Wanting hospital bed.  Spending 10 hours per day in bed and then will get up and sleep in chair.  Trouble rolling over in the bed.  Trouble getting in and out of the bed.    08/03/18 update: Patient is seen today in follow-up for Parkinson's disease, accompanied by his wife who supplies most of the history.  Last visit, I changed his carbidopa/levodopa to carbidopa/levodopa 25/100 CR, 2 tablets at 7 AM/11 AM/3 PM/7 PM.  He remained on carbidopa/levodopa 50/200 at bedtime.  I did this primarily because of increasing confusion.   Wife called and stated that he continued to have hallucinations and confusion and she thought they were worse.  Therefore, I decreased his carbidopa/levodopa 25/100 CR to 2 tablets 3 times per day.  He did get a hospital bed since our last visit.  He was hospitalized in November for acute on chronic heart failure and new onset atrial fibrillation.  He had also recently had possible pneumonia.  Those records have been reviewed.  His Florinef was discontinued because of the heart failure.  He was not placed on anticoagulation for atrial fibrillation because of numerous falls.  He is at [facility] for rehab for the last 3 weeks and is coming home tomorrow.  Hallucinations are improved but still present.  They are constant but "not violent or harmful."  PREVIOUS MEDICATIONS:  Catapres (for sweating, no help); aricept (brady)  ALLERGIES:  No Known Allergies  CURRENT MEDICATIONS:  Outpatient Encounter Medications as of 08/03/2018  Medication Sig  . carbidopa-levodopa (SINEMET CR) 50-200 MG tablet TAKE 1 TABLET BY MOUTH AT  BEDTIME  . Carbidopa-Levodopa ER (SINEMET CR) 25-100 MG tablet controlled release Take 2 tablets by mouth 4 (four) times daily. (Patient taking differently: Take 2 tablets by mouth 3 (three) times daily. )  . chlorhexidine (PERIDEX) 0.12 % solution 15 mLs by Mouth Rinse route 2 (two) times daily. Rinse and spit  . Cholecalciferol (VITAMIN D PO) Take by mouth.    . clonazePAM (KLONOPIN) 0.5 MG tablet Take 1 tablet (0.5 mg total) by mouth at bedtime.  . finasteride (PROSCAR) 5 MG tablet Take 5 mg by mouth daily.    . furosemide (LASIX) 40 MG tablet Take 1 tablet (40 mg total) by mouth daily.  Marland Kitchen ibandronate (BONIVA) 150 MG tablet Take 150 mg by mouth every 30 (thirty) days. Take in the morning with a full glass of water, on an empty stomach, and do not take anything else by mouth or lie down for the next 30 min.  . Multiple Vitamins-Minerals (MULTIVITAMIN PO) Take 1 tablet by mouth  daily.  . TURMERIC PO Take by mouth daily.  . [DISCONTINUED] Coenzyme Q10 (CO Q 10 PO) Take by mouth daily.   No facility-administered encounter medications on file as of 08/03/2018.     PAST MEDICAL HISTORY:   Past Medical History:  Diagnosis Date  . Chronic combined systolic and diastolic CHF (congestive heart failure) (HCC)   . CKD (chronic kidney disease), stage III (HCC)    a. stage II-III by labs, Cr 1.1-1.22 in 09/2017, 1.3-1.4 in 06/2018.  Marland Kitchen Dementia (HCC)   . Fatigue   . Frequent falls   . Hearing loss of both ears   . History of orthostatic hypotension   . Hyperlipidemia   . Hypersomnia,  periodic 02/18/2014  . Hypertension   . Insomnia   . Junctional bradycardia   . Moderate aortic insufficiency   . Osteopenia   . PAF (paroxysmal atrial fibrillation) (HCC)    a. noted in hospital 06/2018, not on anticoag due to h/o frequent falling.  . Parkinson's disease    Possible early parkinsons disease  . Pulmonary hypertension (HCC)   . Sinus bradycardia   . Tricuspid regurgitation     PAST SURGICAL HISTORY:   Past Surgical History:  Procedure Laterality Date  . CATARACT EXTRACTION, BILATERAL    . NASAL SEPTUM SURGERY  1972    SOCIAL HISTORY:   Social History   Socioeconomic History  . Marital status: Married    Spouse name: [name]  . Number of children: 1  . Years of education: 5216  . Highest education level: Not on file  Occupational History  . Occupation: retired    Comment: [employer]  Social Needs  . Financial resource strain: Not on file  . Food insecurity:    Worry: Not on file    Inability: Not on file  . Transportation needs:    Medical: Not on file    Non-medical: Not on file  Tobacco Use  . Smoking status: Never Smoker  . Smokeless tobacco: Never Used  Substance and Sexual Activity  . Alcohol use: No  . Drug use: No  . Sexual activity: Not on file  Lifestyle  . Physical activity:    Days per week: Not on  file    Minutes per session: Not on file  . Stress: Not on file  Relationships  . Social connections:    Talks on phone: Not on file    Gets together: Not on file    Attends religious service: Not on file    Active member of club or organization: Not on file    Attends meetings of clubs or organizations: Not on file    Relationship status: Not on file  . Intimate partner violence:    Fear of current or ex partner: Not on file    Emotionally abused: Not on file    Physically abused: Not on file    Forced sexual activity: Not on file  Other Topics Concern  . Not on file  Social History Narrative   Patient is married [name]([name]) and lives at home with his wife.   Patient has one adult child.   Patient is retired.   Patient has a college education.   Patient is right-handed.   Patient drinks one cup of coffee daily.    FAMILY HISTORY:   Family Status  Relation Name Status  . Mother died at 3293 Deceased  . Father influenza Deceased  . Sister  Alive  . Son  Alive    ROS:  Review of Systems  Unable to perform ROS: Dementia     PHYSICAL EXAMINATION:    VITALS:   Vitals:   08/03/18 1050  BP: 130/64  Pulse: 74  SpO2: 92%   GEN:  The patient appears stated age and is in NAD. HEENT:  Normocephalic, atraumatic.  The mucous membranes are moist. The superficial temporal arteries are without ropiness or tenderness. CV:  RRR Lungs:  CTAB Neck/HEME:  There are no carotid bruits bilaterally.  Neurological examination:  Orientation: The patient is alert and oriented to person but not place or time. Cranial nerves: There is good facial symmetry. The speech is fluent and clear but lacks spontaneity.  Soft palate rises symmetrically and there is no tongue deviation. Hearing is intact to conversational tone. Sensation: Sensation is intact to light touch throughout Motor: Strength is at least antigravity x4.  Movement examination: Tone: There is normal tone throughout. Abnormal  movements: There is no tremor.  There is occasional dyskinesia in the right leg. Coordination:  There is significant apraxia today and there is difficulty performing rapid alternating movements. Gait and Station: The patient requires help getting out of the chair, primarily because of apraxia.  Once up, he stands within the walker and has to be told to take a step.  He does not really walk.  Wife reports that he has been walking up to 200 feet with therapy, but she also states that some days he does this and just stands and does not participate.  Lab Results  Component Value Date   WBC 6.8 07/12/2018   HGB 14.1 07/12/2018   HCT 44.7 07/12/2018   MCV 98.2 07/12/2018   PLT 223 07/12/2018     Chemistry      Component Value Date/Time   NA 143 07/21/2018 1211   K 3.5 07/21/2018 1211   CL 100 07/21/2018 1211   CO2 28 07/21/2018 1211   BUN 27 07/21/2018 1211   CREATININE 1.15 07/21/2018 1211   CREATININE 1.13 (H) 03/14/2018 1626      Component Value Date/Time   CALCIUM 9.4 07/21/2018 1211   ALKPHOS 63 10/10/2017 1338   AST 17 03/14/2018 1626   ALT 10 03/14/2018 1626   BILITOT 0.6 03/14/2018 1626     Lab Results  Component Value Date   TSH 3.757 07/09/2018     ASSESSMENT/PLAN:  1.   Akinetic idiopathic Parkinson's disease.  The patient was diagnosed in approximately 2011  -We discussed the diagnosis as well as pathophysiology of the disease.  We discussed treatment options as well as prognostic indicators.  Patient education was provided.  -We discussed that it used to be thought that levodopa would increase risk of melanoma but now it is believed that Parkinsons itself likely increases risk of melanoma. he is to get regular skin checks.  -We have been decreasing his levodopa dose and changed it to the CR, because of confusion and hallucinations.  However, I told the patient's wife that I think that most of the confusion and hallucination is Parkinson's related dementia.  We talked  extensively about Nuplazid, but I told her that I am not sure I feel comfortable with that right now given his recent EKG and the QTc interval.  It was 539.  I am going to go ahead and have the nursing facility repeat that today.  Until then, I am going to slightly drop the carbidopa/levodopa 25/100 CR to 2 tablets in the morning, 2 in the afternoon and 1 in the evening.  -Continue carbidopa/levodopa 50/200 at bedtime.  -Prescription for lift chair was provided.  2.  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  His Moca was 12/30 in November, 2018.  This is a common part of Parkinson's disease and we discussed this in detail today.  His wife does have 24 hour/day care and he is getting back out of the nursing facility tomorrow.  3.  Follow up is anticipated in the next few months, sooner should new neurologic issues arise.   Much greater than 50% of this visit was spent in counseling and coordinating care.  Total face to face time:  30 min  Cc:  Randy Ran, MD

## 2018-08-03 ENCOUNTER — Encounter: Payer: Self-pay | Admitting: Neurology

## 2018-08-03 ENCOUNTER — Ambulatory Visit: Payer: Medicare Other | Admitting: Neurology

## 2018-08-03 VITALS — BP 130/64 | HR 74

## 2018-08-03 DIAGNOSIS — R9431 Abnormal electrocardiogram [ECG] [EKG]: Secondary | ICD-10-CM | POA: Diagnosis not present

## 2018-08-03 DIAGNOSIS — F0280 Dementia in other diseases classified elsewhere without behavioral disturbance: Secondary | ICD-10-CM | POA: Diagnosis not present

## 2018-08-03 DIAGNOSIS — G20 Parkinson's disease: Secondary | ICD-10-CM | POA: Diagnosis not present

## 2018-08-03 MED ORDER — AMBULATORY NON FORMULARY MEDICATION: 0 refills | Status: AC

## 2018-08-04 ENCOUNTER — Telehealth: Payer: Self-pay | Admitting: Neurology

## 2018-08-04 NOTE — Telephone Encounter (Signed)
[facility] August 03, 2018.  QT was 427.  QTc was 442.  Jade, let patient's wife know that we can try Nuplazid if she would like.  She can come and pick up samples and can sign the form.  We already discussed r/b/se

## 2018-08-07 MED ORDER — PIMAVANSERIN TARTRATE 34 MG PO CAPS: 1.0000 | ORAL_CAPSULE | Freq: Every day | ORAL | 0 refills | Status: DC

## 2018-08-07 NOTE — Telephone Encounter (Signed)
Patient's wife made aware. She will come to the office to sign paperwork and pick up samples.

## 2018-08-07 NOTE — Telephone Encounter (Signed)
Tried to call patient's wife with no answer and no voicemail.

## 2018-08-14 ENCOUNTER — Emergency Department (HOSPITAL_COMMUNITY)
Admission: EM | Admit: 2018-08-14 | Discharge: 2018-08-15 | Disposition: A | Discharge status: Home / Self Care | Payer: Medicare Other | Attending: Emergency Medicine | Admitting: Emergency Medicine

## 2018-08-14 ENCOUNTER — Other Ambulatory Visit: Payer: Self-pay

## 2018-08-14 ENCOUNTER — Emergency Department (HOSPITAL_COMMUNITY): Payer: Medicare Other

## 2018-08-14 ENCOUNTER — Encounter (HOSPITAL_COMMUNITY): Payer: Self-pay | Admitting: Emergency Medicine

## 2018-08-14 ENCOUNTER — Telehealth: Payer: Self-pay | Admitting: Neurology

## 2018-08-14 DIAGNOSIS — N183 Chronic kidney disease, stage 3 (moderate): Secondary | ICD-10-CM | POA: Insufficient documentation

## 2018-08-14 DIAGNOSIS — Z79899 Other long term (current) drug therapy: Secondary | ICD-10-CM | POA: Diagnosis not present

## 2018-08-14 DIAGNOSIS — F0390 Unspecified dementia without behavioral disturbance: Secondary | ICD-10-CM | POA: Insufficient documentation

## 2018-08-14 DIAGNOSIS — W19XXXA Unspecified fall, initial encounter: Secondary | ICD-10-CM

## 2018-08-14 DIAGNOSIS — R5383 Other fatigue: Secondary | ICD-10-CM | POA: Diagnosis not present

## 2018-08-14 DIAGNOSIS — I5042 Chronic combined systolic (congestive) and diastolic (congestive) heart failure: Secondary | ICD-10-CM | POA: Insufficient documentation

## 2018-08-14 DIAGNOSIS — I130 Hypertensive heart and chronic kidney disease with heart failure and stage 1 through stage 4 chronic kidney disease, or unspecified chronic kidney disease: Secondary | ICD-10-CM | POA: Diagnosis not present

## 2018-08-14 DIAGNOSIS — R531 Weakness: Secondary | ICD-10-CM | POA: Diagnosis present

## 2018-08-14 DIAGNOSIS — G20 Parkinson's disease: Secondary | ICD-10-CM

## 2018-08-14 LAB — I-STAT CHEM 8, ED
BUN: 28 mg/dL — ABNORMAL HIGH (ref 8–23)
Calcium, Ion: 1.11 mmol/L — ABNORMAL LOW (ref 1.15–1.40)
Chloride: 104 mmol/L (ref 98–111)
Creatinine, Ser: 1.1 mg/dL (ref 0.61–1.24)
Glucose, Bld: 89 mg/dL (ref 70–99)
HCT: 43 % (ref 39.0–52.0)
Hemoglobin: 14.6 g/dL (ref 13.0–17.0)
Potassium: 4.1 mmol/L (ref 3.5–5.1)
Sodium: 140 mmol/L (ref 135–145)
TCO2: 29 mmol/L (ref 22–32)

## 2018-08-14 LAB — CBC WITH DIFFERENTIAL/PLATELET
Abs Immature Granulocytes: 0.04 10*3/uL (ref 0.00–0.07)
Basophils Absolute: 0 10*3/uL (ref 0.0–0.1)
Basophils Relative: 0 %
Eosinophils Absolute: 0.1 10*3/uL (ref 0.0–0.5)
Eosinophils Relative: 1 %
HCT: 45.1 % (ref 39.0–52.0)
Hemoglobin: 14.2 g/dL (ref 13.0–17.0)
Immature Granulocytes: 1 %
Lymphocytes Relative: 19 %
Lymphs Abs: 1.5 10*3/uL (ref 0.7–4.0)
MCH: 30.3 pg (ref 26.0–34.0)
MCHC: 31.5 g/dL (ref 30.0–36.0)
MCV: 96.4 fL (ref 80.0–100.0)
Monocytes Absolute: 0.8 10*3/uL (ref 0.1–1.0)
Monocytes Relative: 10 %
Neutro Abs: 5.3 10*3/uL (ref 1.7–7.7)
Neutrophils Relative %: 69 %
Platelets: 163 10*3/uL (ref 150–400)
RBC: 4.68 MIL/uL (ref 4.22–5.81)
RDW: 12 % (ref 11.5–15.5)
WBC: 7.7 10*3/uL (ref 4.0–10.5)
nRBC: 0 % (ref 0.0–0.2)

## 2018-08-14 LAB — URINALYSIS, COMPLETE (UACMP) WITH MICROSCOPIC
Bacteria, UA: NONE SEEN
Bilirubin Urine: NEGATIVE
Glucose, UA: NEGATIVE mg/dL
Hgb urine dipstick: NEGATIVE
Ketones, ur: NEGATIVE mg/dL
Leukocytes, UA: NEGATIVE
Nitrite: NEGATIVE
Protein, ur: NEGATIVE mg/dL
Specific Gravity, Urine: 1.01 (ref 1.005–1.030)
pH: 5 (ref 5.0–8.0)

## 2018-08-14 LAB — COMPREHENSIVE METABOLIC PANEL
ALT: 7 U/L (ref 0–44)
AST: 24 U/L (ref 15–41)
Albumin: 3.5 g/dL (ref 3.5–5.0)
Alkaline Phosphatase: 58 U/L (ref 38–126)
Anion gap: 10 (ref 5–15)
BUN: 26 mg/dL — ABNORMAL HIGH (ref 8–23)
CO2: 28 mmol/L (ref 22–32)
Calcium: 9 mg/dL (ref 8.9–10.3)
Chloride: 104 mmol/L (ref 98–111)
Creatinine, Ser: 1.2 mg/dL (ref 0.61–1.24)
GFR calc Af Amer: >60 mL/min (ref >60)
GFR calc non Af Amer: 55 mL/min — ABNORMAL LOW (ref >60)
Glucose, Bld: 93 mg/dL (ref 70–99)
Potassium: 4.2 mmol/L (ref 3.5–5.1)
Sodium: 142 mmol/L (ref 135–145)
Total Bilirubin: 0.6 mg/dL (ref 0.3–1.2)
Total Protein: 6.9 g/dL (ref 6.5–8.1)

## 2018-08-14 LAB — I-STAT CG4 LACTIC ACID, ED: Lactic Acid, Venous: 1.94 mmol/L — ABNORMAL HIGH (ref 0.5–1.9)

## 2018-08-14 LAB — AMMONIA: Ammonia: 28 umol/L (ref 9–35)

## 2018-08-14 MED ORDER — CARBIDOPA-LEVODOPA ER 50-200 MG PO TBCR
1.0000 | EXTENDED_RELEASE_TABLET | Freq: Every day | ORAL | Status: DC
  Filled 2018-08-14: qty 1

## 2018-08-14 NOTE — ED Provider Notes (Signed)
[facility] Provider Note   CSN: [id number] Arrival date & time: 08/14/18  1245     History   Chief Complaint Chief Complaint  Patient presents with  . Weakness    HPI Randy Murphy is a 84 y.o. male who presents the emergency department via EMS for weakness and altered mental status. He  has a past medical history of Chronic combined systolic and diastolic CHF (congestive heart failure) (HCC), CKD (chronic kidney disease), stage III (HCC), Dementia (HCC), Fatigue, Frequent falls, Hearing loss of both ears, History of orthostatic hypotension, Hyperlipidemia, Hypersomnia, periodic (02/18/2014), Hypertension, Insomnia, Junctional bradycardia, Moderate aortic insufficiency, Osteopenia, PAF (paroxysmal atrial fibrillation) (HCC), Parkinson's disease, Pulmonary hypertension (HCC), Sinus bradycardia, and Tricuspid regurgitation.   Patient was admitted about a month ago for community-acquired pneumonia.  He had a follow-up admission for CHF and then was in a skilled nursing facility for about 3 weeks.  He has been home for the past 10 days with his wife who states that he has been doing well up until about 3 days ago when he began having increased global weakness, difficulty standing up on his own.  2 days ago the patient was confused and paranoid.  He felt like his family was going to harm him.  Yesterday the patient fell while he was in the bathroom requiring EMS to come out and help him off the floor.  He thought he was in an airplane yesterday. Today he was sitting on a stool and was unable to stand up from that position.   HPI  Past Medical History:  Diagnosis Date  . Chronic combined systolic and diastolic CHF (congestive heart failure) (HCC)   . CKD (chronic kidney disease), stage III (HCC)    a. stage II-III by labs, Cr 1.1-1.22 in 09/2017, 1.3-1.4 in 06/2018.  Marland Kitchen. Dementia (HCC)   . Fatigue   . Frequent falls   . Hearing loss of both ears   .  History of orthostatic hypotension   . Hyperlipidemia   . Hypersomnia, periodic 02/18/2014  . Hypertension   . Insomnia   . Junctional bradycardia   . Moderate aortic insufficiency   . Osteopenia   . PAF (paroxysmal atrial fibrillation) (HCC)    a. noted in hospital 06/2018, not on anticoag due to h/o frequent falling.  . Parkinson's disease    Possible early parkinsons disease  . Pulmonary hypertension (HCC)   . Sinus bradycardia   . Tricuspid regurgitation     Patient Active Problem List   Diagnosis Date Noted  . Congestive heart failure (CHF) (HCC) 07/09/2018  . Hypersomnia, periodic 02/18/2014  . 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 H/O viral encephalitis 02/18/2014  . Hearing loss of both ears   . Orthostatic hypotension 05/27/2011    Past Surgical History:  Procedure Laterality Date  . CATARACT EXTRACTION, BILATERAL    . NASAL SEPTUM SURGERY  1972        Home Medications    Prior to Admission medications   Medication Sig Start Date End Date Taking? Authorizing Provider  carbidopa-levodopa (SINEMET CR) 50-200 MG tablet TAKE 1 TABLET BY MOUTH AT  BEDTIME 05/17/18  Yes Tat, Rebecca S, DO  Carbidopa-Levodopa ER (SINEMET CR) 25-100 MG tablet controlled release Take 2 tablets by mouth 4 (four) times daily. Patient taking differently: Take 2 tablets by mouth See admin instructions. Taking 2 tablets at 9:00 am and 2 tablets 1:00pm and 1 tablet at 6:00pm  03/16/18  Yes Tat, Octaviano Batty, DO  chlorhexidine (PERIDEX) 0.12 % solution 15 mLs by Mouth Rinse route 2 (two) times daily. Rinse and spit 06/20/18  Yes [provider]  Cholecalciferol (VITAMIN D PO) Take by mouth.     Yes [provider]  clonazePAM (KLONOPIN) 0.5 MG tablet Take 1 tablet (0.5 mg total) by mouth at bedtime. 07/14/18  Yes Berton Mount I, MD  co-enzyme Q-10 50 MG capsule Take 200 mg by mouth daily.   Yes [provider]    finasteride (PROSCAR) 5 MG tablet Take 5 mg by mouth daily.     Yes [provider]  Flaxseed, Linseed, (FLAX SEED OIL) 1000 MG CAPS Take 1 capsule by mouth daily.   Yes [provider]  furosemide (LASIX) 40 MG tablet Take 1 tablet (40 mg total) by mouth daily. 07/15/18  Yes Berton Mount I, MD  ibandronate (BONIVA) 150 MG tablet Take 150 mg by mouth every 30 (thirty) days. Take in the morning with a full glass of water, on an empty stomach, and do not take anything else by mouth or lie down for the next 30 min.   Yes [provider]  Multiple Vitamins-Minerals (MULTIVITAMIN PO) Take 1 tablet by mouth daily.   Yes [provider]  Multiple Vitamins-Minerals (PRESERVISION AREDS 2 PO) Take 1 capsule by mouth 2 (two) times daily.   Yes [provider]  Pimavanserin Tartrate (NUPLAZID) 34 MG CAPS Take 1 tablet by mouth daily. 08/07/18  Yes Tat, Octaviano Batty, DO  Probiotic Product (PROBIOTIC-10 PO) Take 1 tablet by mouth daily.   Yes [provider]  TURMERIC PO Take by mouth daily.   Yes [provider]  AMBULATORY NON FORMULARY MEDICATION Lift Chair DX: G20 08/03/18   Tat, Octaviano Batty, DO    Family History Family History  Problem Relation Age of Onset  . Stroke Sister     Social History Social History   Tobacco Use  . Smoking status: Never Smoker  . Smokeless tobacco: Never Used  Substance Use Topics  . Alcohol use: No  . Drug use: No     Allergies   Patient has no known allergies.   Review of Systems Review of Systems  Ten systems reviewed and are negative for acute change, except as noted in the HPI.   Physical Exam Updated Vital Signs BP 139/61   Pulse (!) 56   Temp (!) 97.5 F (36.4 C) (Oral)   Resp 13   Wt 80 kg   SpO2 97%   BMI 24.60 kg/m   Physical Exam Vitals signs and nursing note reviewed.  Constitutional:      General: He is not in acute distress.    Appearance: He is well-developed. He is  not diaphoretic.  HENT:     Head: Normocephalic and atraumatic.  Eyes:     General: No scleral icterus.    Extraocular Movements: Extraocular movements intact.     Conjunctiva/sclera: Conjunctivae normal.     Pupils: Pupils are equal, round, and reactive to light.  Neck:     Musculoskeletal: Normal range of motion and neck supple.  Cardiovascular:     Rate and Rhythm: Normal rate and regular rhythm.     Heart sounds: Normal heart sounds.  Pulmonary:     Effort: Pulmonary effort is normal. No respiratory distress.     Breath sounds: Normal breath sounds.  Abdominal:     Palpations: Abdomen is soft.  Tenderness: There is no abdominal tenderness.  Skin:    General: Skin is warm and dry.  Neurological:     Mental Status: He is alert.  Psychiatric:        Behavior: Behavior normal.      ED Treatments / Results  Labs (all labs ordered are listed, but only abnormal results are displayed) Labs Reviewed  COMPREHENSIVE METABOLIC PANEL - Abnormal; Notable for the following components:      Result Value   BUN 26 (*)    GFR calc non Af Amer 55 (*)    All other components within normal limits  I-STAT CG4 LACTIC ACID, ED - Abnormal; Notable for the following components:   Lactic Acid, Venous 1.94 (*)    All other components within normal limits  I-STAT CHEM 8, ED - Abnormal; Notable for the following components:   BUN 28 (*)    Calcium, Ion 1.11 (*)    All other components within normal limits  CBC WITH DIFFERENTIAL/PLATELET  URINALYSIS, COMPLETE (UACMP) WITH MICROSCOPIC  AMMONIA  CBG MONITORING, ED    EKG EKG Interpretation  Date/Time:  Monday August 14 2018 12:52:52 EST Ventricular Rate:  63 PR Interval:    QRS Duration: 137 QT Interval:  461 QTC Calculation: 472 R Axis:   -138 Text Interpretation:  Normal sinus rhythm Left bundle branch block REPOLARIZATION ABNORMALITY Confirmed by Margarita Grizzle [phone]) on 08/14/2018 12:58:36 PM   Radiology Dg Chest 2  View  Result Date: 08/14/2018 CLINICAL DATA:  Altered mental status EXAM: CHEST - 2 VIEW COMPARISON:  07/09/2018 FINDINGS: Patient rotated to the right. Moderate cardiomegaly, unchanged. No focal airspace consolidation or pulmonary edema. No pleural effusion or pneumothorax. IMPRESSION: No active cardiopulmonary disease. Electronically Signed   By: Deatra Robinson M.D.   On: 08/14/2018 14:55    Procedures Procedures (including critical care time)  Medications Ordered in ED Medications - No data to display   Initial Impression / Assessment and Plan / ED Course  I have reviewed the triage vital signs and the nursing notes.  Pertinent labs & imaging results that were available during my care of the patient were reviewed by me and considered in my medical decision making (see chart for details).  Clinical Course as of Aug 15 1615  Mon Aug 14, 2018  1515 BUN(!): 28 [AH]  1516 Lactic Acid, Venous(!): 1.94 [AH]  1557 Urinalysis, Complete w Microscopic [AH]    Clinical Course User Index [AH] Arthor Captain, PA-C    Patient with weakness, altered mental status.  The patient CT head is pending.  I reviewed the patient's chest x-ray which shows no acute abnormality.  EKG is unchanged from previous without signs of acute ischemia.  The patient's lactic acid is just 0.04 above normal.  His CMP shows no significant abnormalities.  No elevated white blood cell count or fever.  Patient's urine is still pending.  I suspect this is likely just an exacerbation of his Parkinson's disease.  I have given signout to Dr. Lynford Humphrey who will assume care for appropriate disposition pending results of his head CT and urinalysis.  Final Clinical Impressions(s) / ED Diagnoses   Final diagnoses:  None    ED Discharge Orders    None       Arthor Captain, PA-C 08/14/18 1619    Margarita Grizzle, MD 08/28/18 2324

## 2018-08-14 NOTE — Telephone Encounter (Signed)
Prior authorization request faxed.

## 2018-08-14 NOTE — ED Notes (Signed)
Placed condom cath on patient.

## 2018-08-14 NOTE — ED Provider Notes (Signed)
84 yo male presents with increasing confusion and weakness over 3 days.  Patient home from snf after rehab visit about 10 days ago pmd- Dr. Waynard EdwardsPerini Taking po well Weakness is generalized Vitals:   08/14/18 1300 08/14/18 1315  BP: (!) 143/69 (!) 142/70  Pulse: 60 (!) 58  Resp: 16 18  Temp:    SpO2: 99% 98%      Margarita Grizzleay, Caia Lofaro, MD 08/14/18 1511

## 2018-08-14 NOTE — Telephone Encounter (Signed)
Sue Lushndrea called regarding this patient and his Neuplazid and a prior Auth? She said that they faxed over a Prior Auth and an EOB. Please Fax back at [phone]. Thanks

## 2018-08-14 NOTE — Telephone Encounter (Signed)
Received form this morning and working on completing. Will fax once this is signed by Dr. Arbutus Leasat.

## 2018-08-14 NOTE — ED Notes (Signed)
Pt changed into street clothes, condom cath removed, ready for [name] pickup. Pt resting comfortably

## 2018-08-14 NOTE — ED Notes (Signed)
Pt returned from CT °

## 2018-08-14 NOTE — Telephone Encounter (Signed)
Received notice that pt's NUPLAZID 34mg  Capsule has been   Approved thru 08/30/2019

## 2018-08-14 NOTE — ED Triage Notes (Signed)
Pt in from home via [facility] with c/o incr generalized wkns since 0900 this morning. Per wife, pt was unable to stand from bed or toilet. SBP reported to be 80's this am per wife. CBG 155, had some hallucinations over the wknd.

## 2018-08-14 NOTE — ED Notes (Signed)
MD aweare of urine from condom cath and agrees to same.

## 2018-08-14 NOTE — ED Notes (Signed)
Patient transported to CT 

## 2018-08-14 NOTE — ED Provider Notes (Signed)
Care inherited at 1615 from offgoing team.   [phone] with hx of Parkinsons, CHF, brought in by wife for AMS. Recently admitted for pneumonia followed by admission for CHF. Went to nursing facility then home for 10days. Over past 3days has had confusion and paranoia. Fell in bathroom yesterday & thought he was in an airplane.    F/u urine studies and CT head. Anticipate will need admission for encephalopathy and coninued decline.   Urinalysis with no evidence of infection.  CT head with no acute intracranial abnormality.  Labs reviewed with no significant normalities.  Ammonia within normal limits.  Talked with both patient and his wife at bedside.  Wife reports patient has been at his baseline since being in the ED.  She prefers to be discharged home given unremarkable work-up.  Patient felt comfortable with this plan as well.  They live at home together.  Have 24/7 medical assistance at home as well.  States they prefer to follow-up with PCP tomorrow.  Given unremarkable work-up and hemodynamically stable in the emergency department patient stable for discharge.  Strict return precautions given.  Case and plan of care discussed with Dr. Adela LankFloyd.    Randy Murphy, Randy Hammock, MD 08/14/18 1843    Melene PlanFloyd, Dan, DO 08/14/18 [id number]

## 2018-08-14 NOTE — ED Notes (Signed)
Got patient undress on the monitor did ekg shown to Dr Rosalia Hammersay patient is resting with nurse at bedside

## 2018-08-14 NOTE — ED Notes (Signed)
Called CT regarding wait time for scan . CT states it should be about 30 minutes. Pt and family informed.

## 2018-08-14 NOTE — ED Notes (Signed)
Called ptar for pt, Randy Murphy  

## 2018-08-14 NOTE — ED Notes (Signed)
Patient waiting for [name] with no distress, respirations unlabored , denies pain , family at bedside .

## 2018-08-18 ENCOUNTER — Telehealth: Payer: Self-pay | Admitting: Neurology

## 2018-08-18 NOTE — Telephone Encounter (Signed)
Patient's wife left another vm for the same msg below.

## 2018-08-18 NOTE — Telephone Encounter (Signed)
G20/ F06.0  Given to patient's wife.

## 2018-08-18 NOTE — Telephone Encounter (Signed)
Patient's wife called regarding patient's dx. She said that he was put on a new medication and she is needing to see if he qualifies for an assistance program. Please Call. Thanks

## 2018-08-18 NOTE — Telephone Encounter (Signed)
Patient's wife called regarding the Assistance Fund needing a Dx Code. Please Call. Thanks

## 2018-08-18 NOTE — Telephone Encounter (Signed)
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 She will call back if she needs anything further.

## 2018-08-31 ENCOUNTER — Other Ambulatory Visit: Payer: Self-pay | Admitting: Neurology

## 2018-08-31 NOTE — Telephone Encounter (Signed)
Received RF request for klonopin.   Reviewed on PMP[employer]ware.  RF will be denied along with any further for the medication.  Pt received med on 07/14/18 from Dr. Ellsworth Lennox and received medication from Dr. Rodrigo Ran on 08/17/18 (appears with 5 refills).  Jade, let pt/wife know that we cannot fill due to multiple other prescribers of a controlled substance and having just received from PCP

## 2018-08-31 NOTE — Telephone Encounter (Signed)
Tried to call patient/wife to make them aware. Will let them know if they call here as this originated from the pharmacy.

## 2018-09-08 ENCOUNTER — Other Ambulatory Visit: Payer: Self-pay

## 2018-09-08 ENCOUNTER — Emergency Department (HOSPITAL_COMMUNITY)
Admission: EM | Admit: 2018-09-08 | Discharge: 2018-09-08 | Disposition: A | Discharge status: Home / Self Care | Payer: Medicare Other | Attending: Emergency Medicine | Admitting: Emergency Medicine

## 2018-09-08 ENCOUNTER — Emergency Department (HOSPITAL_COMMUNITY): Payer: Medicare Other

## 2018-09-08 DIAGNOSIS — Z79899 Other long term (current) drug therapy: Secondary | ICD-10-CM | POA: Diagnosis not present

## 2018-09-08 DIAGNOSIS — I130 Hypertensive heart and chronic kidney disease with heart failure and stage 1 through stage 4 chronic kidney disease, or unspecified chronic kidney disease: Secondary | ICD-10-CM | POA: Diagnosis not present

## 2018-09-08 DIAGNOSIS — R531 Weakness: Secondary | ICD-10-CM | POA: Diagnosis not present

## 2018-09-08 DIAGNOSIS — I5042 Chronic combined systolic (congestive) and diastolic (congestive) heart failure: Secondary | ICD-10-CM | POA: Diagnosis not present

## 2018-09-08 DIAGNOSIS — N183 Chronic kidney disease, stage 3 (moderate): Secondary | ICD-10-CM | POA: Diagnosis not present

## 2018-09-08 DIAGNOSIS — R197 Diarrhea, unspecified: Secondary | ICD-10-CM | POA: Diagnosis not present

## 2018-09-08 DIAGNOSIS — I959 Hypotension, unspecified: Secondary | ICD-10-CM | POA: Diagnosis not present

## 2018-09-08 DIAGNOSIS — G20 Parkinson's disease: Secondary | ICD-10-CM | POA: Diagnosis not present

## 2018-09-08 DIAGNOSIS — Z8679 Personal history of other diseases of the circulatory system: Secondary | ICD-10-CM

## 2018-09-08 DIAGNOSIS — R410 Disorientation, unspecified: Secondary | ICD-10-CM | POA: Diagnosis not present

## 2018-09-08 DIAGNOSIS — A09 Infectious gastroenteritis and colitis, unspecified: Secondary | ICD-10-CM | POA: Diagnosis not present

## 2018-09-08 LAB — I-STAT TROPONIN, ED: Troponin i, poc: 0.02 ng/mL (ref 0.00–0.08)

## 2018-09-08 LAB — CBC WITH DIFFERENTIAL/PLATELET
Abs Immature Granulocytes: 0.07 10*3/uL (ref 0.00–0.07)
Basophils Absolute: 0 10*3/uL (ref 0.0–0.1)
Basophils Relative: 0 %
Eosinophils Absolute: 0.1 10*3/uL (ref 0.0–0.5)
Eosinophils Relative: 1 %
HCT: 44.5 % (ref 39.0–52.0)
Hemoglobin: 14.1 g/dL (ref 13.0–17.0)
Immature Granulocytes: 1 %
Lymphocytes Relative: 20 %
Lymphs Abs: 1.8 10*3/uL (ref 0.7–4.0)
MCH: 31 pg (ref 26.0–34.0)
MCHC: 31.7 g/dL (ref 30.0–36.0)
MCV: 97.8 fL (ref 80.0–100.0)
Monocytes Absolute: 1 10*3/uL (ref 0.1–1.0)
Monocytes Relative: 12 %
Neutro Abs: 5.8 10*3/uL (ref 1.7–7.7)
Neutrophils Relative %: 66 %
Platelets: 230 10*3/uL (ref 150–400)
RBC: 4.55 MIL/uL (ref 4.22–5.81)
RDW: 12.7 % (ref 11.5–15.5)
WBC: 8.7 10*3/uL (ref 4.0–10.5)
nRBC: 0 % (ref 0.0–0.2)

## 2018-09-08 LAB — URINALYSIS, ROUTINE W REFLEX MICROSCOPIC
Bilirubin Urine: NEGATIVE
Glucose, UA: NEGATIVE mg/dL
Hgb urine dipstick: NEGATIVE
Ketones, ur: 5 mg/dL — AB
Leukocytes, UA: NEGATIVE
Nitrite: NEGATIVE
Protein, ur: NEGATIVE mg/dL
Specific Gravity, Urine: 1.012 (ref 1.005–1.030)
pH: 5 (ref 5.0–8.0)

## 2018-09-08 LAB — COMPREHENSIVE METABOLIC PANEL
ALT: 8 U/L (ref 0–44)
AST: 23 U/L (ref 15–41)
Albumin: 3.8 g/dL (ref 3.5–5.0)
Alkaline Phosphatase: 69 U/L (ref 38–126)
Anion gap: 10 (ref 5–15)
BUN: 33 mg/dL — ABNORMAL HIGH (ref 8–23)
CO2: 29 mmol/L (ref 22–32)
Calcium: 8.9 mg/dL (ref 8.9–10.3)
Chloride: 103 mmol/L (ref 98–111)
Creatinine, Ser: 1.11 mg/dL (ref 0.61–1.24)
GFR calc Af Amer: >60 mL/min (ref >60)
GFR calc non Af Amer: >60 mL/min (ref >60)
Glucose, Bld: 111 mg/dL — ABNORMAL HIGH (ref 70–99)
Potassium: 4.4 mmol/L (ref 3.5–5.1)
Sodium: 142 mmol/L (ref 135–145)
Total Bilirubin: 0.4 mg/dL (ref 0.3–1.2)
Total Protein: 7.2 g/dL (ref 6.5–8.1)

## 2018-09-08 LAB — I-STAT CHEM 8, ED
BUN: 43 mg/dL — ABNORMAL HIGH (ref 8–23)
Calcium, Ion: 1.13 mmol/L — ABNORMAL LOW (ref 1.15–1.40)
Chloride: 104 mmol/L (ref 98–111)
Creatinine, Ser: 1 mg/dL (ref 0.61–1.24)
Glucose, Bld: 112 mg/dL — ABNORMAL HIGH (ref 70–99)
HCT: 44 % (ref 39.0–52.0)
Hemoglobin: 15 g/dL (ref 13.0–17.0)
Potassium: 5.2 mmol/L — ABNORMAL HIGH (ref 3.5–5.1)
Sodium: 139 mmol/L (ref 135–145)
TCO2: 32 mmol/L (ref 22–32)

## 2018-09-08 LAB — I-STAT CG4 LACTIC ACID, ED: Lactic Acid, Venous: 1.62 mmol/L (ref 0.5–1.9)

## 2018-09-08 MED ORDER — SODIUM CHLORIDE 0.9 % IV BOLUS
250.0000 mL | Freq: Once | INTRAVENOUS | Status: AC
  Administered 2018-09-08: 250 mL via INTRAVENOUS

## 2018-09-08 MED ORDER — SODIUM CHLORIDE 0.9 % IV BOLUS
500.0000 mL | Freq: Once | INTRAVENOUS | Status: AC
  Administered 2018-09-08: 500 mL via INTRAVENOUS

## 2018-09-08 MED ORDER — SODIUM CHLORIDE (PF) 0.9 % IJ SOLN
INTRAMUSCULAR | Status: AC
  Filled 2018-09-08: qty 50

## 2018-09-08 MED ORDER — SODIUM CHLORIDE 0.9 % IV BOLUS: 250.0000 mL | Freq: Once | INTRAVENOUS | Status: DC

## 2018-09-08 MED ORDER — IOPAMIDOL (ISOVUE-300) INJECTION 61%
INTRAVENOUS | Status: AC
  Filled 2018-09-08: qty 100

## 2018-09-08 MED ORDER — IOPAMIDOL (ISOVUE-300) INJECTION 61%
100.0000 mL | Freq: Once | INTRAVENOUS | Status: AC | PRN
  Administered 2018-09-08: 100 mL via INTRAVENOUS

## 2018-09-08 NOTE — Discharge Instructions (Addendum)
I am concerned that the automatic blood pressure cuff at home may be inaccurate.  Please have the nursing staff at home take manual blood pressures with a manual blood pressure cuff.  If Randy Murphy diarrhea continues, please collect a stool sample in the materials were provided and call his primary care provider for order.  Please return to the emergency department if he develops any worsening diarrhea, if he is not tolerating any food or drink by mouth, or appears to be more weak than normal.  Thank you for allowing [place] to participate in your care today.

## 2018-09-08 NOTE — ED Notes (Signed)
Pt tolerated a [place] sandwich and water without difficulty.

## 2018-09-08 NOTE — ED Notes (Signed)
Pt in scan 

## 2018-09-08 NOTE — ED Notes (Signed)
Bed: WA17 Expected date:  Expected time:  Means of arrival:  Comments: EMS hypotension diarrhea

## 2018-09-08 NOTE — ED Triage Notes (Signed)
Pt BIB EMS from home. Home health nurse said pt BP 68/42. Pt has been having diarrhea since this morning. Pt was pale and diaphoretic when EMS arrived. Per wife, pt has been having low BPs lately. Hx of dementia and Parkinsons.  22 LH 100/64 to 137/62 after 250L NS HR 61 97% RA  CBG 128

## 2018-09-08 NOTE — ED Provider Notes (Addendum)
Level 5 caveat dementia history is obtained from patient's wife who accompanies him.  Patient was somewhat difficult to arouse this morning for approximately 1 hour.  His wife reports that he had a pulse ox in the 70s, blood pressure was in the 60s.  Treated with saline 250 mL bolus prior to arrival by EMS..  He had one loose bowel movement earlier today.  His wife reports that he has been eating well no blood per rectum.  No fever.  Patient denies pain anywhere.  Denies shortness of breath on exam patient is alert pleasant cooperative HEENT exam no facial asymmetry mucous membranes moist lungs clear to auscultation heart regular rate and rhythm abdomen soft nontender.  All 4 extremities are redness swelling or tenderness neurovascular intact.  Skin warm and dry.  No rash.  No decubitus ulcers   Doug Sou, MD 09/08/18 1404    Doug Sou, MD 09/08/18 [phone]

## 2018-09-08 NOTE — ED Notes (Signed)
PA made aware that patient was unable to stand with a walker. Pt helped up, but kept falling back in to bed. Not able to ambulate with [place].

## 2018-09-08 NOTE — ED Provider Notes (Signed)
[facility]-EMERGENCY DEPT Provider Note   CSN: [id number] Arrival date & time: 09/08/18  1224     History   Chief Complaint Chief Complaint  Patient presents with  . Hypotension  . Diarrhea    HPI Randy Murphy is a 85 y.o. male.  HPI   Patient reports that just prior to arrival he was "captured" by EMS.  He states he does not know why he was transferred to the emergency department.  He does note that he has had diarrhea.  He denies any pain at this time.  Denies vomiting.  Level 5 caveat dementia.  1:33 PM collateral information obtained from patient's caregiver, home health RN.  According to caregiver, patient had 5 times episodes of diarrhea this morning.  He appeared weak, slightly more confused than usual, could barely walk with his walker when he is usually amatory with walker.  No vomiting.  Patient was able to eat his breakfast.  Per RN, he appeared diaphoretic after using the bathroom, had a blood pressure of 68/42.  Home health RN could not identify whether or not this blood pressure occurred immediately after using the bathroom. No recent falls.   Additional collateral information obtained from patient's wife who states the patient had 1 episode of loose stool today, and appeared more somnolent and did not want to wake up as readily this morning.  Earlier this week, he had 2 other blood pressures with systolic blood pressure in the 70s.  Past Medical History:  Diagnosis Date  . Chronic combined systolic and diastolic CHF (congestive heart failure) (HCC)   . CKD (chronic kidney disease), stage III (HCC)    a. stage II-III by labs, Cr 1.1-1.22 in 09/2017, 1.3-1.4 in 06/2018.  Marland Kitchen Dementia (HCC)   . Fatigue   . Frequent falls   . Hearing loss of both ears   . History of orthostatic hypotension   . Hyperlipidemia   . Hypersomnia, periodic 02/18/2014  . Hypertension   . Insomnia   . Junctional bradycardia   . Moderate aortic insufficiency   .  Osteopenia   . PAF (paroxysmal atrial fibrillation) (HCC)    a. noted in hospital 06/2018, not on anticoag due to h/o frequent falling.  . Parkinson's disease    Possible early parkinsons disease  . Pulmonary hypertension (HCC)   . Sinus bradycardia   . Tricuspid regurgitation     Patient Active Problem List   Diagnosis Date Noted  . Congestive heart failure (CHF) (HCC) 07/09/2018  . Hypersomnia, periodic 02/18/2014  . 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 H/O viral encephalitis 02/18/2014  . Hearing loss of both ears   . Orthostatic hypotension 05/27/2011    Past Surgical History:  Procedure Laterality Date  . CATARACT EXTRACTION, BILATERAL    . NASAL SEPTUM SURGERY  1972        Home Medications    Prior to Admission medications   Medication Sig Start Date End Date Taking? Authorizing Provider  AMBULATORY NON FORMULARY MEDICATION Lift Chair DX: G20 08/03/18   Tat, Octaviano Batty, DO  carbidopa-levodopa (SINEMET CR) 50-200 MG tablet TAKE 1 TABLET BY MOUTH AT  BEDTIME 05/17/18   Tat, Rebecca S, DO  Carbidopa-Levodopa ER (SINEMET CR) 25-100 MG tablet controlled release Take 2 tablets by mouth 4 (four) times daily. Patient taking differently: Take 2 tablets by mouth See admin instructions. Taking 2 tablets at 9:00 am and 2 tablets 1:00pm and 1 tablet at 6:00pm  03/16/18   Tat, Octaviano Battyebecca S, DO  chlorhexidine (PERIDEX) 0.12 % solution 15 mLs by Mouth Rinse route 2 (two) times daily. Rinse and spit 06/20/18   [provider]  Cholecalciferol (VITAMIN D PO) Take by mouth.      [provider]  clonazePAM (KLONOPIN) 0.5 MG tablet Take 1 tablet (0.5 mg total) by mouth at bedtime. 07/14/18   Berton Mountgbata, Sylvester I, MD  co-enzyme Q-10 50 MG capsule Take 200 mg by mouth daily.    [provider]  finasteride (PROSCAR) 5 MG tablet Take 5 mg by mouth daily.      [provider]  Flaxseed, Linseed, (FLAX SEED  OIL) 1000 MG CAPS Take 1 capsule by mouth daily.    [provider]  furosemide (LASIX) 40 MG tablet Take 1 tablet (40 mg total) by mouth daily. 07/15/18   Barnetta Chapelgbata, Sylvester I, MD  ibandronate (BONIVA) 150 MG tablet Take 150 mg by mouth every 30 (thirty) days. Take in the morning with a full glass of water, on an empty stomach, and do not take anything else by mouth or lie down for the next 30 min.    [provider]  Multiple Vitamins-Minerals (MULTIVITAMIN PO) Take 1 tablet by mouth daily.    [provider]  Multiple Vitamins-Minerals (PRESERVISION AREDS 2 PO) Take 1 capsule by mouth 2 (two) times daily.    [provider]  Pimavanserin Tartrate (NUPLAZID) 34 MG CAPS Take 1 tablet by mouth daily. 08/07/18   Tat, Octaviano Battyebecca S, DO  Probiotic Product (PROBIOTIC-10 PO) Take 1 tablet by mouth daily.    [provider]  TURMERIC PO Take by mouth daily.    [provider]    Family History Family History  Problem Relation Age of Onset  . Stroke Sister     Social History Social History   Tobacco Use  . Smoking status: Never Smoker  . Smokeless tobacco: Never Used  Substance Use Topics  . Alcohol use: No  . Drug use: No     Allergies   Patient has no known allergies.   Review of Systems Review of Systems  Gastrointestinal: Positive for diarrhea. Negative for abdominal pain.   Level 5 caveat dementia.  Physical Exam Updated Vital Signs BP (!) 152/63   Pulse 61   Temp (!) 97.2 F (36.2 C) (Rectal)   Resp 15   SpO2 100%   Physical Exam Vitals signs and nursing note reviewed.  Constitutional:      General: He is not in acute distress.    Appearance: He is well-developed. He is not ill-appearing.     Comments: Alert, but appearing confused.   HENT:     Head: Normocephalic and atraumatic.     Mouth/Throat:     Mouth: Mucous membranes are moist.  Eyes:     Conjunctiva/sclera: Conjunctivae normal.     Pupils: Pupils are  equal, round, and reactive to light.  Neck:     Musculoskeletal: Normal range of motion and neck supple.  Cardiovascular:     Rate and Rhythm: Normal rate and regular rhythm.     Pulses: Normal pulses.          Radial pulses are 2+ on the right side and 2+ on the left side.       Popliteal pulses are 2+ on the right side and 2+ on the left side.     Heart sounds: Normal heart sounds, S1 normal and S2 normal. No murmur.  Pulmonary:     Effort: Pulmonary effort is normal.     Breath sounds: Normal breath sounds. No wheezing or rales.  Abdominal:     General: There is no distension.     Palpations: Abdomen is soft.     Tenderness: There is no guarding.     Comments: Minimal abdominal TTP of lower abdomen.   Genitourinary:    Comments: Rectal examination performed with nurse tech chaperone present.  No skin breakdown of the sacrum or rectum.  Soft brown stool present in rectal vault.  No blood. Musculoskeletal: Normal range of motion.        General: No deformity.  Lymphadenopathy:     Cervical: No cervical adenopathy.  Skin:    General: Skin is warm and dry.     Findings: No erythema or rash.     Comments: Distal extremities warm and well-perfused.   Neurological:     Mental Status: He is alert.     Comments: Cranial nerves grossly intact. Patient moves extremities symmetrically and with good coordination.  Psychiatric:        Behavior: Behavior normal.        Thought Content: Thought content normal.        Judgment: Judgment normal.      ED Treatments / Results  Labs (all labs ordered are listed, but only abnormal results are displayed) Labs Reviewed  COMPREHENSIVE METABOLIC PANEL - Abnormal; Notable for the following components:      Result Value   Glucose, Bld 111 (*)    BUN 33 (*)    All other components within normal limits  URINALYSIS, ROUTINE W REFLEX MICROSCOPIC - Abnormal; Notable for the following components:   Ketones, ur 5 (*)    All other components within  normal limits  I-STAT CHEM 8, ED - Abnormal; Notable for the following components:   Potassium 5.2 (*)    BUN 43 (*)    Glucose, Bld 112 (*)    Calcium, Ion 1.13 (*)    All other components within normal limits  CULTURE, BLOOD (ROUTINE X 2)  CULTURE, BLOOD (ROUTINE X 2)  URINE CULTURE  CBC WITH DIFFERENTIAL/PLATELET  I-STAT CG4 LACTIC ACID, ED  I-STAT TROPONIN, ED    EKG EKG Interpretation  Date/Time:  Friday September 08 2018 12:40:29 EST Ventricular Rate:  60 PR Interval:    QRS Duration: 139 QT Interval:  465 QTC Calculation: 465 R Axis:   -57 Text Interpretation:  Sinus rhythm Borderline prolonged PR interval Left bundle branch block No significant change since last tracing Confirmed by Doug Sou [phone]) on 09/08/2018 3:40:03 PM   Radiology Dg Chest 2 View  Result Date: 09/08/2018 CLINICAL DATA:  Diaphoresis.  Pale. EXAM: CHEST - 2 VIEW COMPARISON:  08/14/2018 FINDINGS: The heart is moderately enlarged. Vascular congestion. No sign of pulmonary edema. No consolidation or mass. No pneumothorax or pleural effusion. IMPRESSION: Cardiomegaly and vascular congestion without pulmonary edema. Electronically Signed   By: Jolaine Click M.D.   On: 09/08/2018 15:52   Ct Abdomen Pelvis W Contrast  Result Date: 09/08/2018 CLINICAL DATA:  Infectious gastroenteritis and colitis.  Diarrhea. EXAM: CT ABDOMEN AND PELVIS WITH CONTRAST TECHNIQUE: Multidetector CT imaging of the abdomen and pelvis was performed using the standard protocol following bolus administration of intravenous contrast. CONTRAST:  <See Chart> ISOVUE-300 IOPAMIDOL (ISOVUE-300) INJECTION 61% COMPARISON:  None. FINDINGS: Lower chest: Heart is mildly enlarged. There is no edema or effusion to suggest failure. Pleural pericardial effusion is present. Mild  dependent atelectasis is worse right than left. Hepatobiliary: There is mild diffuse fatty infiltration of the liver. A simple cyst near the dome of the measures 14 mm. No  other discrete lesions are present. The common bile duct is within normal limits. Gallstones are present. No inflammatory changes are present about the gallbladder. Pancreas: The pancreas is atrophic. There is mild dilation of the pancreatic duct. No obstructing lesion is present. No other mass lesion or cyst is present. Spleen: Normal in size without focal abnormality. Adrenals/Urinary Tract: The adrenal glands are normal bilaterally. A 2.1 cm exophytic cystic lesion is present at the lower pole of the right kidney. Left kidney is unremarkable. Ureters are within normal limits bilaterally. Stomach/Bowel: A small hiatal hernia is present. The stomach and duodenum are within normal limits otherwise. Small bowel is unremarkable. The terminal ileum is within normal limits. Appendix is visualized and normal. The ascending and transverse colon are normal. The descending colon is within normal limits. Diverticular changes are present within the sigmoid colon. No focal inflammation is present. Vascular/Lymphatic: Mild atherosclerotic changes are present in the aorta. Maximal transverse diameter is 2.5 cm. There is no aneurysm. No significant adenopathy is present. Reproductive: Prostate is unremarkable. Other: No abdominal wall hernia or abnormality. No abdominopelvic ascites. Musculoskeletal: Mild rightward curvature of the thoracic spine is centered at L1-2. Vertebral body heights are normal. No focal lytic or blastic lesions are present. Degenerative changes are noted within the SI joints and hips bilaterally. IMPRESSION: 1. Mild sigmoid diverticulosis without diverticulitis. 2. No acute or focal abnormality of the large or small bowel to explain patient's enteritis. 3. Mild sigmoid diverticulosis without diverticulitis. 4. Cholelithiasis without cholecystitis. 5. Hepatic steatosis. 6. Pancreatic atrophy. 7. Cardiomegaly without failure. Electronically Signed   By: Marin Roberts M.D.   On: 09/08/2018 15:19     Procedures Procedures (including critical care time)  Medications Ordered in ED Medications  sodium chloride (PF) 0.9 % injection (has no administration in time range)  sodium chloride 0.9 % bolus 250 mL (0 mLs Intravenous Stopped 09/08/18 1419)  iopamidol (ISOVUE-300) 61 % injection 100 mL (100 mLs Intravenous Contrast Given 09/08/18 1437)  sodium chloride 0.9 % bolus 500 mL (0 mLs Intravenous Stopped 09/08/18 1705)     Initial Impression / Assessment and Plan / ED Course  I have reviewed the triage vital signs and the nursing notes.  Pertinent labs & imaging results that were available during my care of the patient were reviewed by me and considered in my medical decision making (see chart for details).  Clinical Course as of Sep 08 1748  Fri Sep 08, 2018  1325 Normal mentation.  Patient has palpable peripheral pulses, 2+ capillary refill in extremities.  Extremities warm.  Appears to be perfusing well.  Will obtain manual blood pressure.   [AM]  1429 Patient wrapped in warm blankets.   Temp(!): 96.4 F (35.8 C) [AM]  1539 Will give patient the rest of a liter.   BUN(!): 33 [AM]  1642 Tolerating p.o. In NAD.    [AM]    Clinical Course User Index [AM] Elisha Ponder, PA-C    Patient is nontoxic-appearing, afebrile, hemodynamically stable and in no acute distress.  He does have 2 slightly low temperatures today.  Blood pressure has normalized.  Question if patient had a vasovagal episode at home accounting for the low blood pressure versus incorrectly obtained or inaccurate blood pressures.  Patient also has Parkinson's, which could cause dysautonomia and orthostatic hypotension accounting  for his low blood pressures today.  Patient is unable to provide much history, abdominal exam unreliable.  Will obtain CBC, CMP, lactic acid, troponin, EKG, CT abdomen and pelvis.  Will provide soft hydration.  Work-up demonstrating slightly elevated BUN, however his BUN is typically  elevated.  Normal creatinine.  Patient given fluid hydration here.  Normal urinalysis.  CT abdomen and pelvis unremarkable without evidence of colitis.  Normal troponin.  EKG without evidence of ischemia, infarction, or arrhythmia.  5:53 PM Patient reassessed at bedside.  Discussed patient's typical functional status with patient's wife.  She states that patient is typically only able to stand at the side of the bed with assistance.  He does not walk without assistance, and can barely ambulate with a walker.  He is working with physical therapy at home.  He has 24-hour care at home.  They are investigating getting a Hoyer lift in the home.  At this time, she feels that patient is at his baseline, patient stood at the bedside in my presence with assistance.  No evidence of orthostasis.  Patient deemed stable for discharge.  Return precautions were given for any increasing diarrhea, nausea or vomiting, or any changes in mental status.  Patient was given supplies to collect a stool sample at home should his diarrhea continue to take to his primary care provider.  This is a shared visit with Dr. Doug SouSam Jacubowitz. Patient was independently evaluated by this attending physician. Attending physician consulted in evaluation and discharge management.  Final Clinical Impressions(s) / ED Diagnoses   Final diagnoses:  Diarrhea, unspecified type  History of hypotension    ED Discharge Orders    None       Delia ChimesMurray, Zofia Peckinpaugh B, PA-C 09/08/18 1759    Doug SouJacubowitz, Sam, MD 09/09/18 1600

## 2018-09-08 NOTE — ED Notes (Signed)
Patient transported to X-ray 

## 2018-09-09 LAB — URINE CULTURE: Culture: NO GROWTH

## 2018-09-13 LAB — CULTURE, BLOOD (ROUTINE X 2)
Culture: NO GROWTH
Culture: NO GROWTH
Special Requests: ADEQUATE

## 2018-09-18 ENCOUNTER — Other Ambulatory Visit: Payer: Self-pay | Admitting: Neurology

## 2018-09-19 NOTE — Telephone Encounter (Signed)
Looks like this was last filled by internal medicine. Please advise on refill.

## 2018-09-19 NOTE — Telephone Encounter (Signed)
We have denied previously because has received from other providers.  I can no longer fill.  I believe that you called them before and told them this.

## 2018-09-26 ENCOUNTER — Telehealth: Payer: Self-pay | Admitting: Neurology

## 2018-09-26 NOTE — Telephone Encounter (Signed)
Called and let them know we did not receive a Bridge form, just another treatment form with no instructions. They will send the Bridge form to [place]. We will complete and send back.

## 2018-09-26 NOTE — Telephone Encounter (Signed)
Leeroy Cha called in regards to this patient and a Bridge Form? She said the form has been faxed but they can re fax the form. He is out of medication and needs a form faxed back please. Thanks

## 2018-09-27 NOTE — Telephone Encounter (Signed)
Bridge form received and faxed back.

## 2018-09-28 ENCOUNTER — Encounter: Payer: Self-pay | Admitting: Cardiology

## 2018-10-03 ENCOUNTER — Emergency Department (HOSPITAL_COMMUNITY)
Admission: EM | Admit: 2018-10-03 | Discharge: 2018-10-04 | Disposition: A | Discharge status: Home / Self Care | Payer: Medicare Other | Attending: Emergency Medicine | Admitting: Emergency Medicine

## 2018-10-03 ENCOUNTER — Other Ambulatory Visit: Payer: Self-pay

## 2018-10-03 ENCOUNTER — Encounter (HOSPITAL_COMMUNITY): Payer: Self-pay

## 2018-10-03 ENCOUNTER — Emergency Department (HOSPITAL_COMMUNITY): Payer: Medicare Other

## 2018-10-03 DIAGNOSIS — F0390 Unspecified dementia without behavioral disturbance: Secondary | ICD-10-CM | POA: Diagnosis not present

## 2018-10-03 DIAGNOSIS — Z79899 Other long term (current) drug therapy: Secondary | ICD-10-CM | POA: Diagnosis not present

## 2018-10-03 DIAGNOSIS — R55 Syncope and collapse: Secondary | ICD-10-CM | POA: Insufficient documentation

## 2018-10-03 DIAGNOSIS — I130 Hypertensive heart and chronic kidney disease with heart failure and stage 1 through stage 4 chronic kidney disease, or unspecified chronic kidney disease: Secondary | ICD-10-CM | POA: Diagnosis not present

## 2018-10-03 DIAGNOSIS — N183 Chronic kidney disease, stage 3 (moderate): Secondary | ICD-10-CM | POA: Diagnosis not present

## 2018-10-03 DIAGNOSIS — I5042 Chronic combined systolic (congestive) and diastolic (congestive) heart failure: Secondary | ICD-10-CM | POA: Diagnosis not present

## 2018-10-03 LAB — CBC WITH DIFFERENTIAL/PLATELET
Abs Immature Granulocytes: 0.08 10*3/uL — ABNORMAL HIGH (ref 0.00–0.07)
Basophils Absolute: 0.1 10*3/uL (ref 0.0–0.1)
Basophils Relative: 1 %
Eosinophils Absolute: 0.3 10*3/uL (ref 0.0–0.5)
Eosinophils Relative: 4 %
HCT: 43.2 % (ref 39.0–52.0)
Hemoglobin: 13.6 g/dL (ref 13.0–17.0)
Immature Granulocytes: 1 %
Lymphocytes Relative: 16 %
Lymphs Abs: 1.4 10*3/uL (ref 0.7–4.0)
MCH: 29.8 pg (ref 26.0–34.0)
MCHC: 31.5 g/dL (ref 30.0–36.0)
MCV: 94.7 fL (ref 80.0–100.0)
Monocytes Absolute: 1 10*3/uL (ref 0.1–1.0)
Monocytes Relative: 12 %
Neutro Abs: 6 10*3/uL (ref 1.7–7.7)
Neutrophils Relative %: 66 %
Platelets: 244 10*3/uL (ref 150–400)
RBC: 4.56 MIL/uL (ref 4.22–5.81)
RDW: 13.1 % (ref 11.5–15.5)
WBC: 8.9 10*3/uL (ref 4.0–10.5)
nRBC: 0 % (ref 0.0–0.2)

## 2018-10-03 LAB — URINALYSIS, ROUTINE W REFLEX MICROSCOPIC
Bilirubin Urine: NEGATIVE
Glucose, UA: NEGATIVE mg/dL
Hgb urine dipstick: NEGATIVE
Ketones, ur: 5 mg/dL — AB
Leukocytes, UA: NEGATIVE
Nitrite: NEGATIVE
Protein, ur: NEGATIVE mg/dL
Specific Gravity, Urine: 1.023 (ref 1.005–1.030)
pH: 5 (ref 5.0–8.0)

## 2018-10-03 LAB — COMPREHENSIVE METABOLIC PANEL
ALT: 6 U/L (ref 0–44)
AST: 19 U/L (ref 15–41)
Albumin: 3.1 g/dL — ABNORMAL LOW (ref 3.5–5.0)
Alkaline Phosphatase: 69 U/L (ref 38–126)
Anion gap: 9 (ref 5–15)
BUN: 32 mg/dL — ABNORMAL HIGH (ref 8–23)
CO2: 27 mmol/L (ref 22–32)
Calcium: 9 mg/dL (ref 8.9–10.3)
Chloride: 101 mmol/L (ref 98–111)
Creatinine, Ser: 1.3 mg/dL — ABNORMAL HIGH (ref 0.61–1.24)
GFR calc Af Amer: 58 mL/min — ABNORMAL LOW (ref >60)
GFR calc non Af Amer: 50 mL/min — ABNORMAL LOW (ref >60)
Glucose, Bld: 152 mg/dL — ABNORMAL HIGH (ref 70–99)
Potassium: 4.4 mmol/L (ref 3.5–5.1)
Sodium: 137 mmol/L (ref 135–145)
Total Bilirubin: 0.7 mg/dL (ref 0.3–1.2)
Total Protein: 6.8 g/dL (ref 6.5–8.1)

## 2018-10-03 LAB — I-STAT TROPONIN, ED: Troponin i, poc: 0.02 ng/mL (ref 0.00–0.08)

## 2018-10-03 LAB — CBG MONITORING, ED: Glucose-Capillary: 133 mg/dL — ABNORMAL HIGH (ref 70–99)

## 2018-10-03 MED ORDER — SODIUM CHLORIDE 0.9 % IV BOLUS
250.0000 mL | Freq: Once | INTRAVENOUS | Status: AC
  Administered 2018-10-03: 250 mL via INTRAVENOUS

## 2018-10-03 NOTE — ED Triage Notes (Signed)
Per ems pt had syncopal episode at home. Was sitting in chair with wife and slumped over, non responsive. At baseline pt is non ambulatory. Upon ems arrival pt alert at baseline  to name, city, wife name, birthday. Pt with history dimentia

## 2018-10-03 NOTE — ED Triage Notes (Signed)
Pt BP at home 80/40 en route to hospital 133/65

## 2018-10-03 NOTE — ED Provider Notes (Signed)
[facility] EMERGENCY DEPARTMENT Provider Note   CSN: [id number] Arrival date & time: 10/03/18  2027     History   Chief Complaint Chief Complaint  Patient presents with  . Loss of Consciousness    HPI Randy Murphy is a 85 y.o. male.  The history is provided by the patient, the EMS personnel, medical records and the spouse. No language interpreter was used.  Loss of Consciousness   Randy Murphy is a 85 y.o. male who presents to the Emergency Department complaining of syncope.  Level V caveat due to dementia.  Hx is provided by EMS and wife.  Pt presents from home following syncopal event.  They were eating dinner and he fell asleep at the table and he looked "out of it."  His wife checked his blood pressure and it was 81/54, heart rate of 77.  He was not feeling well this morning and had a large amount of diarrhea along with an episode of vomiting, decreased oral intake in the afternoon. Since the event he has been back at his baseline. Blood pressure improved prior to ED arrival. Past Medical History:  Diagnosis Date  . Chronic combined systolic and diastolic CHF (congestive heart failure) (HCC)   . CKD (chronic kidney disease), stage III (HCC)    a. stage II-III by labs, Cr 1.1-1.22 in 09/2017, 1.3-1.4 in 06/2018.  Marland Kitchen Dementia (HCC)   . Fatigue   . Frequent falls   . Hearing loss of both ears   . History of orthostatic hypotension   . Hyperlipidemia   . Hypersomnia, periodic 02/18/2014  . Hypertension   . Insomnia   . Junctional bradycardia   . Moderate aortic insufficiency   . Osteopenia   . PAF (paroxysmal atrial fibrillation) (HCC)    a. noted in hospital 06/2018, not on anticoag due to h/o frequent falling.  . Parkinson's disease    Possible early parkinsons disease  . Pulmonary hypertension (HCC)   . Sinus bradycardia   . Tricuspid regurgitation     Patient Active Problem List   Diagnosis Date Noted  . Congestive heart failure (CHF) (HCC)  07/09/2018  . Hypersomnia, periodic 02/18/2014  . 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 H/O viral encephalitis 02/18/2014  . Hearing loss of both ears   . Orthostatic hypotension 05/27/2011    Past Surgical History:  Procedure Laterality Date  . CATARACT EXTRACTION, BILATERAL    . NASAL SEPTUM SURGERY  1972        Home Medications    Prior to Admission medications   Medication Sig Start Date End Date Taking? Authorizing Provider  AMBULATORY NON FORMULARY MEDICATION Lift Chair DX: G20 08/03/18   Tat, Octaviano Batty, DO  carbidopa-levodopa (SINEMET CR) 50-200 MG tablet TAKE 1 TABLET BY MOUTH AT  BEDTIME 05/17/18   Tat, Rebecca S, DO  Carbidopa-Levodopa ER (SINEMET CR) 25-100 MG tablet controlled release Take 2 tablets by mouth 4 (four) times daily. Patient taking differently: Take 2 tablets by mouth See admin instructions. Taking 2 tablets at 9:00 am and 2 tablets 1:00pm and 1 tablet at 6:00pm 03/16/18   Tat, Octaviano Batty, DO  chlorhexidine (PERIDEX) 0.12 % solution 15 mLs by Mouth Rinse route 2 (two) times daily. Rinse and spit 06/20/18   [provider]  cholecalciferol (VITAMIN D3) 25 MCG (1000 UT) tablet Take 1,000 Units by mouth daily.    [provider]  clonazePAM (KLONOPIN) 0.5 MG tablet Take 1  tablet (0.5 mg total) by mouth at bedtime. 07/14/18   Berton Mount I, MD  co-enzyme Q-10 50 MG capsule Take 200 mg by mouth at bedtime.     [provider]  finasteride (PROSCAR) 5 MG tablet Take 5 mg by mouth daily.      [provider]  Flaxseed, Linseed, (FLAX SEED OIL) 1000 MG CAPS Take 1 capsule by mouth at bedtime.     [provider]  furosemide (LASIX) 40 MG tablet Take 1 tablet (40 mg total) by mouth daily. 07/15/18   Barnetta Chapel, MD  ibandronate (BONIVA) 150 MG tablet Take 150 mg by mouth every 30 (thirty) days. Take in the morning with a full glass of water, on an empty  stomach, and do not take anything else by mouth or lie down for the next 30 min.    [provider]  Multiple Vitamins-Minerals (MULTIVITAMIN PO) Take 1 tablet by mouth daily.    [provider]  Multiple Vitamins-Minerals (PRESERVISION AREDS 2 PO) Take 1 capsule by mouth 2 (two) times daily.    [provider]  Pimavanserin Tartrate (NUPLAZID) 34 MG CAPS Take 1 tablet by mouth daily. 08/07/18   Tat, Octaviano Batty, DO  Probiotic Product (PROBIOTIC-10 PO) Take 1 tablet by mouth daily.    [provider]    Family History Family History  Problem Relation Age of Onset  . Stroke Sister     Social History Social History   Tobacco Use  . Smoking status: Never Smoker  . Smokeless tobacco: Never Used  Substance Use Topics  . Alcohol use: No  . Drug use: No     Allergies   Patient has no known allergies.   Review of Systems Review of Systems  Cardiovascular: Positive for syncope.  All other systems reviewed and are negative.    Physical Exam Updated Vital Signs BP (!) 164/67   Pulse 65   Temp 98.5 F (36.9 C) (Oral)   Resp 16   SpO2 96%   Physical Exam Vitals signs and nursing note reviewed.  Constitutional:      Appearance: He is well-developed.  HENT:     Head: Normocephalic and atraumatic.  Cardiovascular:     Rate and Rhythm: Normal rate and regular rhythm.     Heart sounds: No murmur.  Pulmonary:     Effort: Pulmonary effort is normal. No respiratory distress.     Breath sounds: Normal breath sounds.  Abdominal:     Palpations: Abdomen is soft.     Tenderness: There is no abdominal tenderness. There is no guarding or rebound.  Musculoskeletal:        General: No tenderness.  Skin:    General: Skin is warm and dry.  Neurological:     Mental Status: He is alert and oriented to person, place, and time.     Comments: Confused, slow to answer questions, 4/5 strength in all four extremities.    Psychiatric:        Behavior:  Behavior normal.      ED Treatments / Results  Labs (all labs ordered are listed, but only abnormal results are displayed) Labs Reviewed  COMPREHENSIVE METABOLIC PANEL - Abnormal; Notable for the following components:      Result Value   Glucose, Bld 152 (*)    BUN 32 (*)    Creatinine, Ser 1.30 (*)    Albumin 3.1 (*)    GFR calc non Af Amer 50 (*)  GFR calc Af Amer 58 (*)    All other components within normal limits  CBC WITH DIFFERENTIAL/PLATELET - Abnormal; Notable for the following components:   Abs Immature Granulocytes 0.08 (*)    All other components within normal limits  URINALYSIS, ROUTINE W REFLEX MICROSCOPIC - Abnormal; Notable for the following components:   Color, Urine AMBER (*)    APPearance HAZY (*)    Ketones, ur 5 (*)    All other components within normal limits  CBG MONITORING, ED - Abnormal; Notable for the following components:   Glucose-Capillary 133 (*)    All other components within normal limits  I-STAT TROPONIN, ED    EKG EKG Interpretation  Date/Time:  Tuesday October 03 2018 20:31:47 EST Ventricular Rate:  64 PR Interval:    QRS Duration: 138 QT Interval:  444 QTC Calculation: 459 R Axis:   -44 Text Interpretation:  Sinus rhythm Left bundle branch block No significant change since last tracing Confirmed by Tilden Fossaees, Ziyon Soltau [id number]) on 10/03/2018 8:58:44 PM   Radiology Dg Chest 2 View  Result Date: 10/03/2018 CLINICAL DATA:  Low blood pressure, syncope EXAM: CHEST - 2 VIEW COMPARISON:  08/14/2018, 09/08/2018 FINDINGS: There is bilateral mild interstitial thickening. There is no focal consolidation. There is no pleural effusion or pneumothorax. The heart and mediastinal contours are unremarkable. The osseous structures are unremarkable. IMPRESSION: No active cardiopulmonary disease. Electronically Signed   By: Elige KoHetal  Patel   On: 10/03/2018 21:56   Ct Head Wo Contrast  Result Date: 10/03/2018 CLINICAL DATA:  85 year old male with syncope at  home. EXAM: CT HEAD WITHOUT CONTRAST TECHNIQUE: Contiguous axial images were obtained from the base of the skull through the vertex without intravenous contrast. COMPARISON:  08/14/2018 and earlier. FINDINGS: Brain: Stable cerebral volume. No midline shift, mass effect, or evidence of intracranial mass lesion. No ventriculomegaly. Confluent bilateral cerebral white matter hypodensity. Stable gray-white matter differentiation throughout the brain. No acute intracranial hemorrhage identified. No cortically based acute infarct identified. Vascular: Calcified atherosclerosis at the skull base. No suspicious intracranial vascular hyperdensity. Skull: No acute osseous abnormality identified. Sinuses/Orbits: Stable right sphenoid sinusitis with mucoperiosteal thickening and some bubbly opacity. Remaining sinuses remain well pneumatized. Tympanic cavities and mastoids are clear. Other: No acute orbit or scalp soft tissue findings. IMPRESSION: 1. No acute intracranial abnormality. Stable non contrast CT appearance of the brain. 2. Chronic right sphenoid sinus disease. Electronically Signed   By: Odessa FlemingH  Hall M.D.   On: 10/03/2018 23:30    Procedures Procedures (including critical care time)  Medications Ordered in ED Medications  sodium chloride 0.9 % bolus 250 mL (0 mLs Intravenous Stopped 10/03/18 2237)     Initial Impression / Assessment and Plan / ED Course  I have reviewed the triage vital signs and the nursing notes.  Pertinent labs & imaging results that were available during my care of the patient were reviewed by me and considered in my medical decision making (see chart for details).     Patient with history of Parkinson's disease, CKD, CHF here for evaluation following syncopal movement. He did have a brief episode of vomiting and diarrhea this morning, now resolved. He is back at his baseline on ED arrival and prehospital hypotension resolved without intervention. Abdominal examination is benign.  Presentation is not consistent with sepsis, PE, ACS, arrhythmia. Plan to discharge home with close outpatient follow-up as well as return precautions.  Final Clinical Impressions(s) / ED Diagnoses   Final diagnoses:  Syncope and collapse  ED Discharge Orders    None       Tilden Fossa, MD 10/03/18 2345

## 2018-10-04 NOTE — ED Notes (Signed)
Patient verbalizes understanding of discharge instructions. Opportunity for questioning and answers were provided. Armband removed by staff, pt discharged from ED with [name].  

## 2018-10-09 ENCOUNTER — Telehealth: Payer: Self-pay | Admitting: Neurology

## 2018-10-09 NOTE — Telephone Encounter (Signed)
Patient's wife made aware this form was completed and faxed back on 09/26/2018.

## 2018-10-09 NOTE — Telephone Encounter (Signed)
Wife left vm about Nuplazid form that needed to be completed for the pharm. Please complete this for a month supply. This medication has been working well for him she said. Thanks!

## 2018-10-19 ENCOUNTER — Telehealth: Payer: Self-pay | Admitting: Neurology

## 2018-10-19 NOTE — Telephone Encounter (Signed)
Patient wife calling in about approval for the nuplazid. She needs Dr.Tat to write a memo as to why he needs around the clock care. She said she wanted you to call because she needed to update you since he hasn't been seen in so long. Please call her at [phone]. Thanks!

## 2018-10-19 NOTE — Telephone Encounter (Signed)
Left message on machine for patient's wife to call back.   

## 2018-10-20 ENCOUNTER — Ambulatory Visit: Payer: Medicare Other | Admitting: Cardiology

## 2018-10-20 ENCOUNTER — Encounter: Payer: Self-pay | Admitting: Neurology

## 2018-10-20 NOTE — Telephone Encounter (Signed)
Tried to call again with no answer  

## 2018-10-20 NOTE — Telephone Encounter (Signed)
Spoke with patient's wife. She states Nuplazid patient support has still denied them due to income being too high. They will take into consideration what they are paying for 24/7 care if we write something stating why he needs 24/7 care. I let wife know we will write a note and fax to Nuplazid patient assistance at [phone]. She states patient is not performing any ADL's on his own now. She is even feeding him. Will write note.

## 2018-10-20 NOTE — Telephone Encounter (Signed)
Note written and faxed to [phone] with confirmation received.

## 2018-11-09 ENCOUNTER — Telehealth: Payer: Self-pay | Admitting: Neurology

## 2018-11-09 DIAGNOSIS — G20 Parkinson's disease: Secondary | ICD-10-CM

## 2018-11-09 DIAGNOSIS — F0280 Dementia in other diseases classified elsewhere without behavioral disturbance: Secondary | ICD-10-CM

## 2018-11-09 NOTE — Telephone Encounter (Signed)
Patient's wife called stating that on 11/08/2018 home health came out to the patients home and stated that she needed to call the office and let the provider know that he needs more physical therapy. Home health is recommending if possible to have physical therapy daily at home.   Secondly, she also stated that she needs to get him a power wheelchair that has controls in the back of the wheelchair so that the person loading him in the [place] would need access to the controls to load him in the [place]. She wanted to know if Dr. Arbutus Leas could write her an order for the wheelchair. The wife stated that she didn't want to use [facility].

## 2018-11-10 NOTE — Telephone Encounter (Signed)
You can order PT if Medicare will pay for daily.  Re: power chair.  Medicare will not pay for power chair in patients with memory loss/dementia, and definitely won't pay for the controls to be in the back of the chair (we have tried this many times).

## 2018-11-13 ENCOUNTER — Telehealth: Payer: Self-pay | Admitting: Cardiology

## 2018-11-13 NOTE — Addendum Note (Signed)
Addended bySilvio Pate on: 11/13/2018 10:58 AM   Modules accepted: Orders

## 2018-11-13 NOTE — Telephone Encounter (Signed)
Spoke to his wife [name], he is doing well.  They are fine and appreciative with rescheduling his upcoming Thursday appointment secondary to coronavirus.  Donato Schultz, MD

## 2018-11-13 NOTE — Telephone Encounter (Signed)
Spoke with patient's wife. Made her aware we would be happy to sign orders for therapy, but can't guarantee they will be paid for. He does have long term care insurance as well.   Spoke about power wheel chair. She also wanted to use long term care insurance. Made her aware I'm not sure how that would work, but we do only work with [facility] as they can help facilitate the process and get the mobility needs of the patient met in a timely manner. She expressed understanding.   Order sent to [facility].

## 2018-11-14 ENCOUNTER — Telehealth: Payer: Self-pay | Admitting: Neurology

## 2018-11-14 NOTE — Telephone Encounter (Signed)
Randy Murphy from [facility] was calling to let you know she received the referral and is seeing the patient on Thursday 3/19. Just FYI. Thanks!

## 2018-11-14 NOTE — Telephone Encounter (Signed)
Noted  

## 2018-11-16 ENCOUNTER — Ambulatory Visit: Payer: Medicare Other | Admitting: Cardiology

## 2018-11-28 ENCOUNTER — Other Ambulatory Visit: Payer: Self-pay | Admitting: Neurology

## 2018-11-29 ENCOUNTER — Other Ambulatory Visit: Payer: Self-pay | Admitting: *Deleted

## 2018-11-29 MED ORDER — CARBIDOPA-LEVODOPA ER 50-200 MG PO TBCR: 1.0000 | EXTENDED_RELEASE_TABLET | Freq: Every day | ORAL | 1 refills | Status: DC

## 2018-11-29 NOTE — Telephone Encounter (Signed)
You can send RX for the carbidopa/levodopa but not the klonopin.  Has received that (a controlled substance) from other physicians and I can no longer prescribe that.  I believe we have already told them this in the past.

## 2019-01-09 ENCOUNTER — Telehealth: Payer: Self-pay | Admitting: Neurology

## 2019-01-09 NOTE — Telephone Encounter (Signed)
Erin from [employer] left msg with after hours about patient being out of medication. She said the bridge enrollment form was sent but they still need it back. Return phone # is [phone]. Thanks!

## 2019-01-10 ENCOUNTER — Telehealth: Payer: Self-pay | Admitting: Neurology

## 2019-01-10 NOTE — Telephone Encounter (Signed)
Also, patient does not have any appointments with me.  Have them make an evisit while you have them on the phone.

## 2019-01-10 NOTE — Telephone Encounter (Signed)
I spoke with Randy Murphy and she said that the form was faxed over on 01/05/2019.  I have not seen the form yet.  She is going to take a verbal Rx order and refax the form for Dr. Arbutus Leas to sign.

## 2019-01-10 NOTE — Telephone Encounter (Signed)
[name] is calling in about patient and said he is becoming depressed and needs some medication. Please call in some medication for him to the [facility] on [address]. Thanks!

## 2019-01-10 NOTE — Telephone Encounter (Signed)
While it could be associated with PD, it really isn't within my area of expertise,  However, I am happy to refer to a psychiatrist that often helps me manage Parkinsons patients mood issues.  If agreeable, refer to Dr. Jennelle Human.

## 2019-01-10 NOTE — Telephone Encounter (Signed)
Erin called in again and left VM about Nuplax[employer] forms. Please complete these. Thanks!

## 2019-01-11 NOTE — Telephone Encounter (Signed)
Patient's wife is setting up the e-visit and would like to talk to you before we refer to psychiatry.

## 2019-01-11 NOTE — Telephone Encounter (Signed)
Called Randy Murphy and left message for her to call me back.

## 2019-01-17 ENCOUNTER — Other Ambulatory Visit: Payer: Self-pay | Admitting: Neurology

## 2019-01-18 NOTE — Telephone Encounter (Signed)
Requested Prescriptions   Pending Prescriptions Disp Refills  . clonazePAM (KLONOPIN) 0.5 MG tablet [Pharmacy Med Name: CLONAZEPAM  0.5MG   TAB] 90 tablet     Sig: TAKE 1 TABLET BY MOUTH AT  BEDTIME  . Carbidopa-Levodopa ER (SINEMET CR) 25-100 MG tablet controlled release [Pharmacy Med Name: CARB/LEVO ER TAB 25-100MG ] 720 tablet 1    Sig: TAKE 2 TABLETS BY MOUTH 4  TIMES DAILY.   Rx last filled:03/16/18 #720 1 refill Clonazepam 07/14/18 #30 1 refills by Dr. Berton Mount  Pt last seen:08/03/18  Follow up scheduled: 01/23/19

## 2019-01-19 NOTE — Progress Notes (Signed)
Virtual Visit via Phone Note (scheduled for video and wife refused stating that house was a mess) The purpose of this virtual visit is to provide medical care while limiting exposure to the novel coronavirus.    Consent was obtained for phone visit:  Yes.   Answered questions that patient had about telephone interaction:  Yes.   I discussed the limitations, risks, security and privacy concerns of performing an evaluation and management service by telephone. I also discussed with the patient that there may be a patient responsible charge related to this service. The patient expressed understanding and agreed to proceed.  Pt location: Home Physician Location: office Name of referring provider:  [name], Mark, MD I connected with Randy Hockonald H Masini at patients initiation/request on 01/23/2019 at 10:45 AM EDT by telephone application and verified that I am speaking with the correct person using two identifiers. Pt MRN:  [id number] Pt DOB:  10/20/1931 Video Participants:  Randy Murphy; wife   History of Present Illness:  Patient seen today in follow-up, accompanied by his wife who supplements history.  I have been decreasing the patient's levodopa, primarily because of confusion and hallucinations.  He is on carbidopa/levodopa 25/100 CR, 2 tablets in the morning, 2 in the afternoon and 1 in the evening.  He is on carbidopa/levodopa 50/200 at bedtime.   I was initially not comfortable giving him Nuplazid because he had a QTC of 537.  I repeated his EKG on August 03, 2018.  His QT was 427 and QTc was 442.  His wife wanted to try Nuplazid, which was started at the end of December.  Wife called in February to state that it was working well.  Wife states that there was a time where it wasn't being sent at all because of logistical problems and they weren't getting the medication steadily.  His wife would notice a definite difference when he wasn't on the medication b/c he was "belligerent and wouldn't know  where he was."  When he is on it, he is able to recognize it is a hallucination.  Wife states that he is "freezing" but when asked how he "freezes" when he cannot walk, wife states that pt becomes so stiff that he cannot move or stretch out his legs in the bed.  However, once the exercise him, he does much better.  No falls since Christmas but he isn't walking any longer.  PT is still working with him.  She called me in March to state that they wanted an order for power wheelchair with controls in the back.  I explained to her that Medicare will not pay for power wheelchair in patients who have memory loss/dementia and certainly will not pay for controls in the back of the chair.   Using bedpan for bowel movements.  Using a hoyer lift (on a second one).  They have [facility] caregiving services (24 hours per day).   Patient's wife called me on May 13 stating that he was depressed and wanted me to send in some medication for that.  I told her that was really out of my area of expertise and recommended psychiatry.  She wanted to hold off on that until this visit.  Wife states that he was like this (depressed) when he was off of the nuplazid but since getting back on it, he seems to be better and both pt and wife agree he is better.  No trouble swallowing.     PREVIOUS MEDICATIONS:  Catapres (for sweating,  no help); aricept (brady)  Current Outpatient Medications on File Prior to Visit  Medication Sig Dispense Refill   AMBULATORY NON FORMULARY MEDICATION Lift Chair DX: G20 1 Device 0   carbidopa-levodopa (SINEMET CR) 50-200 MG tablet Take 1 tablet by mouth at bedtime. 90 tablet 1   Carbidopa-Levodopa ER (SINEMET CR) 25-100 MG tablet controlled release TAKE 2 TABLETS BY MOUTH 4  TIMES DAILY. 720 tablet 1   chlorhexidine (PERIDEX) 0.12 % solution 15 mLs by Mouth Rinse route 2 (two) times daily. Rinse and spit  2   cholecalciferol (VITAMIN D3) 25 MCG (1000 UT) tablet Take 1,000 Units by mouth daily.      clonazePAM (KLONOPIN) 0.5 MG tablet Take 1 tablet (0.5 mg total) by mouth at bedtime. 30 tablet 1   co-enzyme Q-10 50 MG capsule Take 200 mg by mouth at bedtime.      finasteride (PROSCAR) 5 MG tablet Take 5 mg by mouth daily.       ibandronate (BONIVA) 150 MG tablet Take 150 mg by mouth every 30 (thirty) days. Take in the morning with a full glass of water, on an empty stomach, and do not take anything else by mouth or lie down for the next 30 min.     Multiple Vitamins-Minerals (MULTIVITAMIN PO) Take 1 tablet by mouth daily.     Multiple Vitamins-Minerals (PRESERVISION AREDS 2 PO) Take 1 capsule by mouth 2 (two) times daily.     Pimavanserin Tartrate (NUPLAZID) 34 MG CAPS Take 1 tablet by mouth daily. 14 capsule 0   Probiotic Product (PROBIOTIC-10 PO) Take 1 tablet by mouth daily.     No current facility-administered medications on file prior to visit.      Observations/Objective:   Vitals:   01/23/19 0842  Height: 5\' 11"  (1.803 m)  Unable to examine, telephone visit (as above, scheduled for video visit, but wife refused stating that house was a mess)    Assessment and Plan:   1.   Akinetic idiopathic Parkinson's disease.  The patient was diagnosed in approximately 2011             -We discussed the diagnosis as well as pathophysiology of the disease.  We discussed treatment options as well as prognostic indicators.  Patient education was provided.             -We discussed that it used to be thought that levodopa would increase risk of melanoma but now it is believed that Parkinsons itself likely increases risk of melanoma. he is to get regular skin checks.             -Continue carbidopa/levodopa 25/100 CR to 2 tablets in the morning, 2 in the afternoon and 1 in the evening.             -Continue carbidopa/levodopa 50/200 at bedtime.             -using [employer].  No longer able to transfer.  -Patient no longer able to walk, but his wife is using the term "freezing."   Discussed with her that it does not sound like he is freezing, but rather that he is stiff from being deconditioned and not moving.  They are really doing a good job at trying to passively and actively move him while in the bed.  Physical therapy comes out once a week to help.  Patient has all the equipment in the home, including a hospital bed.  -Wife has 24 hour/day care for him at  home (round-the-clock caregivers)  2.  Memory Loss             -I do think that the patient has Parkinsons disease dementia.  His Moca was 12/30 in November, 2018.  This is a common part of Parkinson's disease and we discussed this in detail today.    Follow Up Instructions:  Follow-up 6 months, if we are still able to do phone/video visits.  They are really not able to get him back in the office any longer.  -I discussed the assessment and treatment plan with the patient. The patient was provided an opportunity to ask questions and all were answered. The patient agreed with the plan and demonstrated an understanding of the instructions.   The patient was advised to call back or seek an in-person evaluation if the symptoms worsen or if the condition fails to improve as anticipated.    Total Time spent in visit with the patient was: 16 minutes, of which more than 50% of the time was spent in counseling and/or coordinating care on safety.   Pt understands and agrees with the plan of care outlined.     Randy Salen, DO

## 2019-01-23 ENCOUNTER — Other Ambulatory Visit: Payer: Self-pay

## 2019-01-23 ENCOUNTER — Encounter: Payer: Self-pay | Admitting: Neurology

## 2019-01-23 ENCOUNTER — Telehealth (INDEPENDENT_AMBULATORY_CARE_PROVIDER_SITE_OTHER): Payer: Medicare Other | Admitting: Neurology

## 2019-01-23 DIAGNOSIS — G20 Parkinson's disease: Secondary | ICD-10-CM

## 2019-01-23 DIAGNOSIS — G20A1 Parkinson's disease without dyskinesia, without mention of fluctuations: Secondary | ICD-10-CM

## 2019-01-23 DIAGNOSIS — F0281 Dementia in other diseases classified elsewhere with behavioral disturbance: Secondary | ICD-10-CM | POA: Diagnosis not present

## 2019-05-14 ENCOUNTER — Telehealth: Payer: Self-pay | Admitting: Neurology

## 2019-05-14 MED ORDER — NUPLAZID 34 MG PO CAPS: 1.0000 | ORAL_CAPSULE | Freq: Every day | ORAL | 1 refills | Status: DC

## 2019-05-14 NOTE — Telephone Encounter (Signed)
Requested Prescriptions   Pending Prescriptions Disp Refills  . Pimavanserin Tartrate (NUPLAZID) 34 MG CAPS 14 capsule 0    Sig: Take 1 tablet by mouth daily.   Rx last filled:08/07/18 #14 0 refills samples  Pt last seen: 01/23/19   Follow up appt scheduled: 07/30/19

## 2019-05-14 NOTE — Telephone Encounter (Signed)
Needing new prescription for the nuplazid to [facility] phone: [phone]. He couldn't find the fax # for the company. Thanks!

## 2019-05-15 ENCOUNTER — Telehealth: Payer: Self-pay | Admitting: Neurology

## 2019-05-15 NOTE — Telephone Encounter (Signed)
Received call from Clair Gulling as well as pharmacy to give verbal. Pharmacy phone line is down. They will call back for verbal.  Unable to hard fax today provider out of office until Thursday.   Clair Gulling will call back once the pharmacy phone lines are back up to give verbal refill request

## 2019-05-15 NOTE — Telephone Encounter (Signed)
Randy Murphy from [employer] left msg with after hours asking about a verbal RX for the patient who is out of medication. Needing the Nuplazid medication. They left fax # [phone] and phone [phone] ext [phone]. Thanks!

## 2019-05-15 NOTE — Telephone Encounter (Signed)
Called [facility] back spoke with Shirlee Limerick she states that [facility] has Rx in progress she will have her call back to finalize Rx for patient.

## 2019-05-15 NOTE — Telephone Encounter (Signed)
Received hard fax from [facility] they do not supply this medication.  Called patient spouse she states that Rx goes through Nuplzaid patient asstiance program.  Called [facility] [phone] spoke with Lujean Rave   She states that a New Rx is needed. She is not sure if its a new Rx or New enrollment form. She will have someone call office back with clarification.

## 2019-05-16 MED ORDER — NUPLAZID 34 MG PO CAPS: 1.0000 | ORAL_CAPSULE | Freq: Every day | ORAL | 0 refills | Status: DC

## 2019-05-16 NOTE — Telephone Encounter (Signed)
NUPLAZID samples given to patient until Rx is mailed out to patient.  #17  2 boxes LOT:[id number] EXP:09/2020

## 2019-05-16 NOTE — Addendum Note (Signed)
Addended by: Ranae Plumber on: 05/16/2019 01:36 PM   Modules accepted: Orders

## 2019-05-16 NOTE — Telephone Encounter (Signed)
Called Clair Gulling back spoke with Merleen Nicely at pharmacy a verbal was given for patient Nuplzaid #90 1 refills/  A 30 day supply will be sent to patient

## 2019-06-04 ENCOUNTER — Other Ambulatory Visit: Payer: Self-pay | Admitting: Neurology

## 2019-06-20 ENCOUNTER — Telehealth: Payer: Self-pay | Admitting: Neurology

## 2019-06-20 NOTE — Telephone Encounter (Signed)
Pt has parkinsons related dementia.  Medicare doesn't allow for electric WC in patients who have dementia

## 2019-06-20 NOTE — Telephone Encounter (Signed)
Army Melia, RN from [facility] called to recommend an electric life for the patient due to more frequent freezing moments and weakness in the mornings.   Nadine requests a nurse call the patient's wife at the home or mobile number on file to discuss this more over the phone.

## 2019-06-20 NOTE — Telephone Encounter (Signed)
Nurse not available at [phone]

## 2019-06-20 NOTE — Telephone Encounter (Signed)
Contacted Randy Murphy and voicemail left per Dr. Doristine Devoid advise Medicare will not cover the electric WC

## 2019-06-28 ENCOUNTER — Encounter: Payer: Self-pay | Admitting: Neurology

## 2019-07-05 ENCOUNTER — Telehealth: Payer: Self-pay | Admitting: Neurology

## 2019-07-05 ENCOUNTER — Other Ambulatory Visit: Payer: Self-pay | Admitting: Neurology

## 2019-07-05 NOTE — Telephone Encounter (Signed)
Labs received from primary care dated June 28, 2019.  Sodium was 141, potassium 4.3, chloride 106, CO2 21, BUN 23, creatinine 1.0.  White blood cells 8.7, hemoglobin 15.6, hematocrit 48.0 and platelets 198.  TSH 2.02.  Total cholesterol 224, HDL 44, LDL 158

## 2019-07-24 NOTE — Progress Notes (Signed)
Virtual Visit via Telephone Note The purpose of this virtual visit is to provide medical care while limiting exposure to the novel coronavirus.    Consent was obtained for phone visit:  Yes.   Answered questions that patient had about telehealth interaction:  Yes.   I discussed the limitations, risks, security and privacy concerns of performing an evaluation and management service by telephone. I also discussed with the patient that there may be a patient responsible charge related to this service. The patient expressed understanding and agreed to proceed.  Pt location: Home Physician Location: office Name of referring provider:  Crist Infante, MD I connected with .Randy Murphy at patients initiation/request on 07/30/2019 at  2:00 PM EST by telephone and verified that I am speaking with the correct person using two identifiers.  Pt MRN:  [id number] Pt DOB:  04/19/1932  Participants:  Randy Murphy; [name]   History of Present Illness:  Patient seen today in follow-up.  He is on carbidopa/levodopa 25/100 CR, 2 tablets in the morning, 2 in the afternoon and 1 in the evening.  He is also on carbidopa/levodopa 50/200 at bedtime I got a call from one of his home nursing caregivers since our last visit about getting him an IT trainer wheelchair.  I discussed with them that this would not be appropriate, nor paid for by Medicare, in a patient who has dementia.  Pt no longer walking.  He does stand some at the sink.  He is transferred with the hoyer lift.  He is on nuplazid for hallucinations - wife states that she wonders if he may have hallucinations during the day because he may "start a conversation and I'm not sure that I know what he is talking about."  Dreams vivid but on klonopin.  There are 24 hour per day caregivers.   Current Outpatient Medications:  .  AMBULATORY NON FORMULARY MEDICATION, Lift Chair DX: G20, Disp: 1 Device, Rfl: 0 .  carbidopa-levodopa (SINEMET CR) 50-200 MG  tablet, TAKE 1 TABLET BY MOUTH AT  BEDTIME, Disp: 90 tablet, Rfl: 3 .  Carbidopa-Levodopa ER (SINEMET CR) 25-100 MG tablet controlled release, TAKE 2 TABLETS BY MOUTH 4  TIMES DAILY (Patient taking differently: Taking 2 at 6am and 2 at 12pm and 1 with supper.), Disp: 720 tablet, Rfl: 1 .  chlorhexidine (PERIDEX) 0.12 % solution, 15 mLs by Mouth Rinse route 2 (two) times daily. Rinse and spit, Disp: , Rfl: 2 .  cholecalciferol (VITAMIN D3) 25 MCG (1000 UT) tablet, Take 1,000 Units by mouth daily., Disp: , Rfl:  .  clonazePAM (KLONOPIN) 0.5 MG tablet, Take 1 tablet (0.5 mg total) by mouth at bedtime., Disp: 30 tablet, Rfl: 1 .  co-enzyme Q-10 50 MG capsule, Take 200 mg by mouth at bedtime. , Disp: , Rfl:  .  finasteride (PROSCAR) 5 MG tablet, Take 5 mg by mouth daily.  , Disp: , Rfl:  .  ibandronate (BONIVA) 150 MG tablet, Take 150 mg by mouth every 30 (thirty) days. Take in the morning with a full glass of water, on an empty stomach, and do not take anything else by mouth or lie down for the next 30 min., Disp: , Rfl:  .  Multiple Vitamins-Minerals (MULTIVITAMIN PO), Take 1 tablet by mouth daily., Disp: , Rfl:  .  Multiple Vitamins-Minerals (PRESERVISION AREDS 2 PO), Take 1 capsule by mouth 2 (two) times daily., Disp: , Rfl:  .  Pimavanserin Tartrate (NUPLAZID) 34 MG CAPS, Take 1  tablet by mouth daily., Disp: 90 capsule, Rfl: 1 .  Probiotic Product (PROBIOTIC-10 PO), Take 1 tablet by mouth daily., Disp: , Rfl:     Observations/Objective:   There were no vitals filed for this visit.   Assessment and Plan:   1. Akinetic idiopathic Parkinson's disease. The patient was diagnosed in approximately 2011 -We discussed the diagnosis as well as pathophysiology of the disease. We discussed treatment options as well as prognostic indicators. Patient education was provided. -We discussed that it used to be thought that levodopa would increase risk of melanoma but now it is  believed that Parkinsons itself likely increases risk of melanoma. heis to get regular skin checks. -Continue carbidopa/levodopa 25/100 CR to 2 tablets in the morning, 2 in the afternoon and 1 in the evening. -Continue carbidopa/levodopa 50/200 at bedtime.  -Patient really seems to be stable right now, so I did not change anything.  If things go downhill in the future, we probably could consider reducing his levodopa since he does not ambulate any longer. -using [employer].  No longer able to transfer.  -Wife asked me about the Covid vaccine and I answered that to the best of my ability.  All of the data is certainly not out.  2. Parkinson's disease dementia -Patient has 24/day care in the home, with round-the-clock caregivers.  -Continue Nuplazid, 34 mg daily.  That seems to be working well for him.  Labs received from primary care dated June 28, 2019.  Sodium was 141, potassium 4.3, chloride 106, CO2 21, BUN 23, creatinine 1.0.  White blood cells 8.7, hemoglobin 15.6, hematocrit 48.0 and platelets 198.  TSH 2.02.  Total cholesterol 224, HDL 44, LDL 158  Follow Up Instructions:  9 months.  Patient/wife request virtual/telephonic follow-up, as it is very difficult to get the patient into the appointment and wife cannot do it on her own.  -I discussed the assessment and treatment plan with the patient. The patient was provided an opportunity to ask questions and all were answered. The patient agreed with the plan and demonstrated an understanding of the instructions.   The patient was advised to call back or seek an in-person evaluation if the symptoms worsen or if the condition fails to improve as anticipated.    Total Time spent in visit with the patient was:  11 min, of which 100% of the time was spent in counseling .   Pt understands and agrees with the plan of care outlined.     Kerin Salen, DO

## 2019-07-30 ENCOUNTER — Telehealth (INDEPENDENT_AMBULATORY_CARE_PROVIDER_SITE_OTHER): Payer: Medicare Other | Admitting: Neurology

## 2019-07-30 ENCOUNTER — Other Ambulatory Visit: Payer: Self-pay

## 2019-07-30 DIAGNOSIS — G20 Parkinson's disease: Secondary | ICD-10-CM | POA: Diagnosis not present

## 2019-07-30 DIAGNOSIS — F0280 Dementia in other diseases classified elsewhere without behavioral disturbance: Secondary | ICD-10-CM | POA: Diagnosis not present

## 2019-09-25 ENCOUNTER — Ambulatory Visit: Payer: Medicare Other

## 2019-10-04 ENCOUNTER — Ambulatory Visit: Payer: Medicare Other | Attending: Internal Medicine

## 2019-10-04 DIAGNOSIS — Z23 Encounter for immunization: Secondary | ICD-10-CM | POA: Insufficient documentation

## 2019-10-04 NOTE — Progress Notes (Signed)
   Covid-19 Vaccination Clinic  Name:  Randy Murphy    MRN: [id number] DOB: Nov 05, 1931  10/04/2019  [name] was observed post Covid-19 immunization for 15 minutes without incidence. He was provided with Vaccine Information Sheet and instruction to access the V-Safe system.   [name] was instructed to call 911 with any severe reactions post vaccine: Marland Kitchen Difficulty breathing  . Swelling of your face and throat  . A fast heartbeat  . A bad rash all over your body  . Dizziness and weakness    Immunizations Administered    Name Date Dose VIS Date Route   Pfizer COVID-19 Vaccine 10/04/2019  4:11 PM 0.3 mL 08/10/2019 Intramuscular   Manufacturer: [employer], [employer]   Lot: [id number]   NDC: [address]-0

## 2019-10-12 ENCOUNTER — Ambulatory Visit: Payer: Medicare Other

## 2019-10-17 ENCOUNTER — Telehealth: Payer: Self-pay | Admitting: Neurology

## 2019-10-17 MED ORDER — NUPLAZID 34 MG PO CAPS: 34.0000 mg | ORAL_CAPSULE | Freq: Every day | ORAL | 3 refills | Status: DC

## 2019-10-17 NOTE — Telephone Encounter (Signed)
Okay to give verbal for RF nuplazid, 34 mg, 1 daily, #90, RF 3 (one year)

## 2019-10-17 NOTE — Telephone Encounter (Signed)
Randy Murphy with [name] called and said this patient is enrolled in their patient assistance program for Nuplazid and active in the program until 01/10/20.   However, the patient is out of refills for Nuplazid 34 MG. They are requesting a verbal authorization for refills for the patient.

## 2019-10-17 NOTE — Telephone Encounter (Signed)
Contacted [facility] [employer] and requested to speak with Coralyn Mark; receptionist stated she was unavailable. Spoke with Alycia Rossetti in pharmacy dept and gave a verbal order for patients refills with Dr Iona Beard recommedations. Ryan voiced understanding. He also stated that they will contact the patient about sending them him his medication.

## 2019-10-29 ENCOUNTER — Ambulatory Visit: Payer: Medicare Other | Attending: Internal Medicine

## 2019-10-29 DIAGNOSIS — Z23 Encounter for immunization: Secondary | ICD-10-CM | POA: Insufficient documentation

## 2019-10-29 NOTE — Progress Notes (Signed)
   Covid-19 Vaccination Clinic  Name:  Randy Murphy    MRN: [id number] DOB: 12-Jul-1932  10/29/2019  Randy Murphy was observed post Covid-19 immunization for 15 minutes without incidence. He was provided with Vaccine Information Sheet and instruction to access the V-Safe system.   Randy Murphy was instructed to call 911 with any severe reactions post vaccine: Marland Kitchen Difficulty breathing  . Swelling of your face and throat  . A fast heartbeat  . A bad rash all over your body  . Dizziness and weakness    Immunizations Administered    Name Date Dose VIS Date Route   Pfizer COVID-19 Vaccine 10/29/2019  5:52 PM 0.3 mL 08/10/2019 Intramuscular   Manufacturer: [employer], [employer]   Lot: [id number]   NDC: [address]-7

## 2019-11-01 ENCOUNTER — Telehealth: Payer: Self-pay | Admitting: Neurology

## 2019-11-01 NOTE — Telephone Encounter (Signed)
They need to call whomever is already prescribing his klonopin, which is presumably for his sleep and/or behavioral issues.

## 2019-11-01 NOTE — Telephone Encounter (Signed)
Patient wife states patient has dementia and is not sleeping at night and his behavior is changed. She wants to know what to do to help him start sleeping   They use [facility] [address]   You may leave a message on VM per patient wife

## 2019-11-02 NOTE — Telephone Encounter (Signed)
Called patient back but no answer and unable to leave a voicemail.  Will await a call back.

## 2019-11-05 NOTE — Telephone Encounter (Signed)
Spoke with patients wife and she stated she did not get my message on Friday. Informed her of Dr Don Perking recommendations. She voiced understanding.

## 2019-11-12 ENCOUNTER — Encounter (HOSPITAL_COMMUNITY): Payer: Self-pay | Admitting: Emergency Medicine

## 2019-11-12 ENCOUNTER — Other Ambulatory Visit: Payer: Self-pay

## 2019-11-12 ENCOUNTER — Observation Stay (HOSPITAL_COMMUNITY)
Admission: EM | Admit: 2019-11-12 | Discharge: 2019-11-14 | Disposition: A | Discharge status: Home / Self Care | Payer: Medicare Other | Attending: Student | Admitting: Student

## 2019-11-12 DIAGNOSIS — Z79899 Other long term (current) drug therapy: Secondary | ICD-10-CM | POA: Insufficient documentation

## 2019-11-12 DIAGNOSIS — F0281 Dementia in other diseases classified elsewhere with behavioral disturbance: Secondary | ICD-10-CM | POA: Insufficient documentation

## 2019-11-12 DIAGNOSIS — Z20822 Contact with and (suspected) exposure to covid-19: Secondary | ICD-10-CM | POA: Insufficient documentation

## 2019-11-12 DIAGNOSIS — I480 Paroxysmal atrial fibrillation: Secondary | ICD-10-CM | POA: Diagnosis not present

## 2019-11-12 DIAGNOSIS — G478 Other sleep disorders: Secondary | ICD-10-CM | POA: Diagnosis not present

## 2019-11-12 DIAGNOSIS — L89151 Pressure ulcer of sacral region, stage 1: Secondary | ICD-10-CM | POA: Diagnosis not present

## 2019-11-12 DIAGNOSIS — F419 Anxiety disorder, unspecified: Secondary | ICD-10-CM | POA: Diagnosis not present

## 2019-11-12 DIAGNOSIS — F0390 Unspecified dementia without behavioral disturbance: Secondary | ICD-10-CM | POA: Diagnosis present

## 2019-11-12 DIAGNOSIS — G9340 Encephalopathy, unspecified: Secondary | ICD-10-CM | POA: Diagnosis present

## 2019-11-12 DIAGNOSIS — G20 Parkinson's disease: Secondary | ICD-10-CM | POA: Insufficient documentation

## 2019-11-12 DIAGNOSIS — N183 Chronic kidney disease, stage 3 unspecified: Secondary | ICD-10-CM | POA: Insufficient documentation

## 2019-11-12 DIAGNOSIS — G9341 Metabolic encephalopathy: Secondary | ICD-10-CM | POA: Diagnosis not present

## 2019-11-12 DIAGNOSIS — E785 Hyperlipidemia, unspecified: Secondary | ICD-10-CM | POA: Insufficient documentation

## 2019-11-12 DIAGNOSIS — I130 Hypertensive heart and chronic kidney disease with heart failure and stage 1 through stage 4 chronic kidney disease, or unspecified chronic kidney disease: Secondary | ICD-10-CM | POA: Diagnosis present

## 2019-11-12 DIAGNOSIS — I5042 Chronic combined systolic (congestive) and diastolic (congestive) heart failure: Secondary | ICD-10-CM | POA: Diagnosis not present

## 2019-11-12 DIAGNOSIS — I160 Hypertensive urgency: Secondary | ICD-10-CM | POA: Diagnosis present

## 2019-11-12 DIAGNOSIS — R0602 Shortness of breath: Secondary | ICD-10-CM

## 2019-11-12 LAB — BASIC METABOLIC PANEL
Anion gap: 13 (ref 5–15)
BUN: 32 mg/dL — ABNORMAL HIGH (ref 8–23)
CO2: 26 mmol/L (ref 22–32)
Calcium: 9.5 mg/dL (ref 8.9–10.3)
Chloride: 103 mmol/L (ref 98–111)
Creatinine, Ser: 1 mg/dL (ref 0.61–1.24)
GFR calc Af Amer: >60 mL/min (ref >60)
GFR calc non Af Amer: >60 mL/min (ref >60)
Glucose, Bld: 138 mg/dL — ABNORMAL HIGH (ref 70–99)
Potassium: 4.5 mmol/L (ref 3.5–5.1)
Sodium: 142 mmol/L (ref 135–145)

## 2019-11-12 LAB — CBC WITH DIFFERENTIAL/PLATELET
Abs Immature Granulocytes: 0.04 10*3/uL (ref 0.00–0.07)
Basophils Absolute: 0 10*3/uL (ref 0.0–0.1)
Basophils Relative: 1 %
Eosinophils Absolute: 0.2 10*3/uL (ref 0.0–0.5)
Eosinophils Relative: 2 %
HCT: 50.7 % (ref 39.0–52.0)
Hemoglobin: 15.9 g/dL (ref 13.0–17.0)
Immature Granulocytes: 1 %
Lymphocytes Relative: 26 %
Lymphs Abs: 2 10*3/uL (ref 0.7–4.0)
MCH: 31.2 pg (ref 26.0–34.0)
MCHC: 31.4 g/dL (ref 30.0–36.0)
MCV: 99.6 fL (ref 80.0–100.0)
Monocytes Absolute: 0.6 10*3/uL (ref 0.1–1.0)
Monocytes Relative: 8 %
Neutro Abs: 4.7 10*3/uL (ref 1.7–7.7)
Neutrophils Relative %: 62 %
Platelets: 220 10*3/uL (ref 150–400)
RBC: 5.09 MIL/uL (ref 4.22–5.81)
RDW: 11.8 % (ref 11.5–15.5)
WBC: 7.6 10*3/uL (ref 4.0–10.5)
nRBC: 0 % (ref 0.0–0.2)

## 2019-11-12 LAB — TROPONIN I (HIGH SENSITIVITY): Troponin I (High Sensitivity): 19 ng/L — ABNORMAL HIGH (ref <18)

## 2019-11-12 NOTE — ED Triage Notes (Signed)
Pt brought to ED by [facility] from home for c/o HTN, per EMS no prior hx of HTN, no on BP medications. On EMS arrival BP 210/100, on ED arrival 220/120, HR 74, R 16, SPO2 99 CBG 167. Pt is AO x 4, no neuro deficit noticed on triage.

## 2019-11-13 ENCOUNTER — Other Ambulatory Visit: Payer: Self-pay | Admitting: Neurology

## 2019-11-13 ENCOUNTER — Observation Stay (HOSPITAL_COMMUNITY): Payer: Medicare Other

## 2019-11-13 ENCOUNTER — Encounter (HOSPITAL_COMMUNITY): Payer: Self-pay | Admitting: Internal Medicine

## 2019-11-13 ENCOUNTER — Observation Stay (HOSPITAL_BASED_OUTPATIENT_CLINIC_OR_DEPARTMENT_OTHER): Payer: Medicare Other

## 2019-11-13 DIAGNOSIS — G9340 Encephalopathy, unspecified: Secondary | ICD-10-CM

## 2019-11-13 DIAGNOSIS — I160 Hypertensive urgency: Secondary | ICD-10-CM

## 2019-11-13 DIAGNOSIS — I5042 Chronic combined systolic (congestive) and diastolic (congestive) heart failure: Secondary | ICD-10-CM

## 2019-11-13 DIAGNOSIS — F0390 Unspecified dementia without behavioral disturbance: Secondary | ICD-10-CM | POA: Diagnosis present

## 2019-11-13 LAB — URINALYSIS, ROUTINE W REFLEX MICROSCOPIC
Bilirubin Urine: NEGATIVE
Glucose, UA: NEGATIVE mg/dL
Hgb urine dipstick: NEGATIVE
Ketones, ur: 5 mg/dL — AB
Leukocytes,Ua: NEGATIVE
Nitrite: NEGATIVE
Protein, ur: NEGATIVE mg/dL
Specific Gravity, Urine: 1.021 (ref 1.005–1.030)
pH: 5 (ref 5.0–8.0)

## 2019-11-13 LAB — CBC
HCT: 43.4 % (ref 39.0–52.0)
Hemoglobin: 13.9 g/dL (ref 13.0–17.0)
MCH: 31.5 pg (ref 26.0–34.0)
MCHC: 32 g/dL (ref 30.0–36.0)
MCV: 98.4 fL (ref 80.0–100.0)
Platelets: 196 10*3/uL (ref 150–400)
RBC: 4.41 MIL/uL (ref 4.22–5.81)
RDW: 11.7 % (ref 11.5–15.5)
WBC: 8.7 10*3/uL (ref 4.0–10.5)
nRBC: 0 % (ref 0.0–0.2)

## 2019-11-13 LAB — SARS CORONAVIRUS 2 (TAT 6-24 HRS): SARS Coronavirus 2: NEGATIVE

## 2019-11-13 LAB — TROPONIN I (HIGH SENSITIVITY)
Troponin I (High Sensitivity): 23 ng/L — ABNORMAL HIGH (ref <18)
Troponin I (High Sensitivity): 25 ng/L — ABNORMAL HIGH (ref <18)
Troponin I (High Sensitivity): 24 ng/L — ABNORMAL HIGH (ref <18)

## 2019-11-13 LAB — ECHOCARDIOGRAM COMPLETE
Height: 72 in
Weight: 3040 oz

## 2019-11-13 LAB — BRAIN NATRIURETIC PEPTIDE: B Natriuretic Peptide: 180.8 pg/mL — ABNORMAL HIGH (ref 0.0–100.0)

## 2019-11-13 LAB — CREATININE, SERUM
Creatinine, Ser: 1.11 mg/dL (ref 0.61–1.24)
GFR calc Af Amer: >60 mL/min (ref >60)
GFR calc non Af Amer: 60 mL/min — ABNORMAL LOW (ref >60)

## 2019-11-13 MED ORDER — CARBIDOPA-LEVODOPA ER 25-100 MG PO TBCR
2.0000 | EXTENDED_RELEASE_TABLET | Freq: Two times a day (BID) | ORAL | Status: DC
  Administered 2019-11-13 – 2019-11-14 (×3): 2 via ORAL
  Filled 2019-11-13 (×3): qty 2

## 2019-11-13 MED ORDER — ACETAMINOPHEN 160 MG/5ML PO SOLN: 650.0000 mg | ORAL | Status: DC | PRN

## 2019-11-13 MED ORDER — LABETALOL HCL 5 MG/ML IV SOLN
5.0000 mg | Freq: Once | INTRAVENOUS | Status: AC
  Administered 2019-11-13: 5 mg via INTRAVENOUS
  Filled 2019-11-13: qty 4

## 2019-11-13 MED ORDER — CARBIDOPA-LEVODOPA ER 50-200 MG PO TBCR
1.0000 | EXTENDED_RELEASE_TABLET | Freq: Every day | ORAL | Status: DC
  Administered 2019-11-13: 1 via ORAL
  Filled 2019-11-13 (×2): qty 1

## 2019-11-13 MED ORDER — AMLODIPINE BESYLATE 5 MG PO TABS: 5.0000 mg | ORAL_TABLET | Freq: Every day | ORAL | Status: DC

## 2019-11-13 MED ORDER — ASPIRIN 325 MG PO TABS
325.0000 mg | ORAL_TABLET | Freq: Every day | ORAL | Status: DC
  Administered 2019-11-13: 325 mg via ORAL
  Filled 2019-11-13: qty 1

## 2019-11-13 MED ORDER — ACETAMINOPHEN 650 MG RE SUPP: 650.0000 mg | RECTAL | Status: DC | PRN

## 2019-11-13 MED ORDER — ENOXAPARIN SODIUM 40 MG/0.4ML ~~LOC~~ SOLN
40.0000 mg | Freq: Every day | SUBCUTANEOUS | Status: DC
  Administered 2019-11-13 – 2019-11-14 (×2): 40 mg via SUBCUTANEOUS
  Filled 2019-11-13 (×2): qty 0.4

## 2019-11-13 MED ORDER — STROKE: EARLY STAGES OF RECOVERY BOOK
Freq: Once | Status: AC
  Filled 2019-11-13: qty 1

## 2019-11-13 MED ORDER — AMLODIPINE BESYLATE 5 MG PO TABS
5.0000 mg | ORAL_TABLET | Freq: Every day | ORAL | Status: DC
  Administered 2019-11-13 – 2019-11-14 (×2): 5 mg via ORAL
  Filled 2019-11-13 (×2): qty 1

## 2019-11-13 MED ORDER — CARVEDILOL 12.5 MG PO TABS: 6.2500 mg | ORAL_TABLET | Freq: Two times a day (BID) | ORAL | Status: DC

## 2019-11-13 MED ORDER — CARBIDOPA-LEVODOPA ER 25-100 MG PO TBCR
1.0000 | EXTENDED_RELEASE_TABLET | Freq: Every day | ORAL | Status: DC
  Administered 2019-11-13: 1 via ORAL
  Filled 2019-11-13 (×3): qty 1

## 2019-11-13 MED ORDER — PIMAVANSERIN TARTRATE 34 MG PO CAPS
34.0000 mg | ORAL_CAPSULE | Freq: Every day | ORAL | Status: DC
  Administered 2019-11-13 – 2019-11-14 (×2): 34 mg via ORAL
  Filled 2019-11-13 (×2): qty 1

## 2019-11-13 MED ORDER — ASPIRIN 300 MG RE SUPP: 300.0000 mg | Freq: Every day | RECTAL | Status: DC

## 2019-11-13 MED ORDER — FINASTERIDE 5 MG PO TABS
5.0000 mg | ORAL_TABLET | Freq: Every day | ORAL | Status: DC
  Administered 2019-11-13 – 2019-11-14 (×2): 5 mg via ORAL
  Filled 2019-11-13 (×2): qty 1

## 2019-11-13 MED ORDER — VITAMIN D 25 MCG (1000 UNIT) PO TABS
1000.0000 [IU] | ORAL_TABLET | Freq: Every day | ORAL | Status: DC
  Administered 2019-11-13 – 2019-11-14 (×2): 1000 [IU] via ORAL
  Filled 2019-11-13 (×2): qty 1

## 2019-11-13 MED ORDER — ENOXAPARIN SODIUM 40 MG/0.4ML ~~LOC~~ SOLN: 40.0000 mg | SUBCUTANEOUS | Status: DC

## 2019-11-13 MED ORDER — HYDRALAZINE HCL 20 MG/ML IJ SOLN: 10.0000 mg | INTRAMUSCULAR | Status: DC | PRN

## 2019-11-13 MED ORDER — ACETAMINOPHEN 325 MG PO TABS: 650.0000 mg | ORAL_TABLET | ORAL | Status: DC | PRN

## 2019-11-13 MED ORDER — CARBIDOPA-LEVODOPA ER 25-100 MG PO TBCR: 1.0000 | EXTENDED_RELEASE_TABLET | ORAL | Status: DC

## 2019-11-13 MED ORDER — CLONAZEPAM 0.5 MG PO TABS
0.5000 mg | ORAL_TABLET | Freq: Every day | ORAL | Status: DC
  Administered 2019-11-13: 0.5 mg via ORAL
  Filled 2019-11-13: qty 1

## 2019-11-13 NOTE — Progress Notes (Addendum)
  PROGRESS NOTE  Patient admitted earlier this morning. See H&P. Randy Murphy is a 86 y.o. male with history of chronic combined systolic and diastolic CHF, paroxysmal atrial fibrillation, history of bradycardia, Parkinson's disease, advanced dementia, bedbound with stage I sacral decubitus ulcers who was brought to the ER after patient's wife found that patient's blood pressure was remaining very high which is unusual for the patient. Patient's wife also states that patient has become very lethargic yesterday morning and remained so to the evening. In the ED, patient's BP was 192/80 with improvement after IV labetalol. CT head showed possible left-sided thalamic subacute infarct. MRI brain negative for acute change.   Patient pleasantly demented, no physical complaints during my examination. States that he just finished lunch. He is not oriented to place or situation. No family at bedside during my evaluation.   Continue to treat hypertensive urgency. Add norvasc.  Echo shows EF 35-50%, grade 1 dd. No significant change from previous echo in 2019. Does not appear to be fluid overloaded on exam today.   Troponin flat trend, without complaints of chest pain. Likely demand ischemia due to uncontrolled HTN.  PT OT eval pending.    Noralee Stain, DO [facility] 11/13/2019, 10:49 AM  Available via Epic secure chat 7am-7pm After these hours, please refer to coverage provider listed on [email or web address]

## 2019-11-13 NOTE — ED Provider Notes (Signed)
[facility] EMERGENCY DEPARTMENT Provider Note   CSN: [id number] Arrival date & time: 11/12/19  2011     History Chief Complaint  Patient presents with  . Hypertension    Randy Murphy is a 86 y.o. male.  Patient brought to the emergency department for evaluation of elevated blood pressure.  Patient has a history of dementia, is accompanied by his wife who provides all history.  She reports that she has noticed recently that he has had a change in his sleeping habits.  He was initially not sleeping at night for the last few days and then slept all day at one point.  For some reason this caused her to start checking his blood pressure more frequently.  Blood pressure became progressively more elevated today at which time he was brought to the ER.  Patient had not been complaining of headache, vision change, chest pain, shortness of breath.  Wife thinks that he has been dehydrated because he has been urinating less frequently.        Past Medical History:  Diagnosis Date  . Chronic combined systolic and diastolic CHF (congestive heart failure) (HCC)   . CKD (chronic kidney disease), stage III    a. stage II-III by labs, Cr 1.1-1.22 in 09/2017, 1.3-1.4 in 06/2018.  Marland Kitchen Dementia (HCC)   . Fatigue   . Frequent falls   . Hearing loss of both ears   . History of orthostatic hypotension   . Hyperlipidemia   . Hypersomnia, periodic 02/18/2014  . Hypertension   . Insomnia   . Junctional bradycardia   . Moderate aortic insufficiency   . Osteopenia   . PAF (paroxysmal atrial fibrillation) (HCC)    a. noted in hospital 06/2018, not on anticoag due to h/o frequent falling.  . Parkinson's disease    Possible early parkinsons disease  . Pulmonary hypertension (HCC)   . Sinus bradycardia   . Tricuspid regurgitation     Patient Active Problem List   Diagnosis Date Noted  . Congestive heart failure (CHF) (HCC) 07/09/2018  . Hypersomnia, periodic 02/18/2014  .  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 H/O viral encephalitis 02/18/2014  . Hearing loss of both ears   . Orthostatic hypotension 05/27/2011    Past Surgical History:  Procedure Laterality Date  . CATARACT EXTRACTION, BILATERAL    . NASAL SEPTUM SURGERY  1972       Family History  Problem Relation Age of Onset  . Stroke Sister     Social History   Tobacco Use  . Smoking status: Never Smoker  . Smokeless tobacco: Never Used  Substance Use Topics  . Alcohol use: No  . Drug use: No    Home Medications Prior to Admission medications   Medication Sig Start Date End Date Taking? Authorizing Provider  carbidopa-levodopa (SINEMET CR) 50-200 MG tablet TAKE 1 TABLET BY MOUTH AT  BEDTIME 07/06/19  Yes Tat, Rebecca S, DO  Carbidopa-Levodopa ER (SINEMET CR) 25-100 MG tablet controlled release TAKE 2 TABLETS BY MOUTH 4  TIMES DAILY Patient taking differently: Take 1-2 tablets by mouth See admin instructions. Taking 2 at 6am and 2 at 12pm and 1 with supper. 06/05/19  Yes Tat, Octaviano Batty, DO  chlorhexidine (PERIDEX) 0.12 % solution 15 mLs by Mouth Rinse route 2 (two) times daily. Rinse and spit 06/20/18  Yes [provider]  cholecalciferol (VITAMIN D3) 25 MCG (1000 UT) tablet Take 1,000 Units by mouth daily.  Yes [provider]  clonazePAM (KLONOPIN) 0.5 MG tablet Take 1 tablet (0.5 mg total) by mouth at bedtime. 07/14/18  Yes Dana Allan I, MD  Co-Enzyme Q-10 100 MG CAPS Take 200 mg by mouth at bedtime.    Yes [provider]  finasteride (PROSCAR) 5 MG tablet Take 5 mg by mouth daily.     Yes [provider]  ibandronate (BONIVA) 150 MG tablet Take 150 mg by mouth every 30 (thirty) days. Take in the morning with a full glass of water, on an empty stomach, and do not take anything else by mouth or lie down for the next 30 min.   Yes [provider]  Multiple Vitamins-Minerals  (MULTIVITAMIN PO) Take 1 tablet by mouth daily.   Yes [provider]  Multiple Vitamins-Minerals (PRESERVISION AREDS 2 PO) Take 1 capsule by mouth 2 (two) times daily.   Yes [provider]  Pimavanserin Tartrate (NUPLAZID) 34 MG CAPS Take 1 capsule (34 mg total) by mouth daily. 10/17/19  Yes Tat, Eustace Quail, DO  Probiotic Product (PROBIOTIC-10 PO) Take 1 tablet by mouth daily.   Yes [provider]  AMBULATORY NON FORMULARY MEDICATION Lift Chair DX: G20 08/03/18   TatEustace Quail, DO    Allergies    Patient has no known allergies.  Review of Systems   Review of Systems  Genitourinary: Positive for decreased urine volume.  Psychiatric/Behavioral: Positive for sleep disturbance.  All other systems reviewed and are negative.   Physical Exam Updated Vital Signs BP (!) 166/83   Pulse 65   Temp 98.4 F (36.9 C) (Oral)   Resp 12   Ht 6' (1.829 m)   Wt 86.2 kg   SpO2 98%   BMI 25.77 kg/m   Physical Exam Vitals and nursing note reviewed.  Constitutional:      General: He is not in acute distress.    Appearance: Normal appearance. He is well-developed.  HENT:     Head: Normocephalic and atraumatic.     Right Ear: Hearing normal.     Left Ear: Hearing normal.     Nose: Nose normal.  Eyes:     Conjunctiva/sclera: Conjunctivae normal.     Pupils: Pupils are equal, round, and reactive to light.  Cardiovascular:     Rate and Rhythm: Regular rhythm.     Heart sounds: S1 normal and S2 normal. No murmur. No friction rub. No gallop.   Pulmonary:     Effort: Pulmonary effort is normal. No respiratory distress.     Breath sounds: Normal breath sounds.  Chest:     Chest wall: No tenderness.  Abdominal:     General: Bowel sounds are normal.     Palpations: Abdomen is soft.     Tenderness: There is no abdominal tenderness. There is no guarding or rebound. Negative signs include Murphy's sign and McBurney's sign.     Hernia: No hernia is present.   Musculoskeletal:        General: Normal range of motion.     Cervical back: Normal range of motion and neck supple.  Skin:    General: Skin is warm and dry.     Findings: No rash.  Neurological:     Mental Status: He is alert. Mental status is at baseline. He is disoriented.     GCS: GCS eye subscore is 4. GCS verbal subscore is 4. GCS motor subscore is 6.     Cranial Nerves: No cranial nerve deficit.  Sensory: No sensory deficit.     Coordination: Coordination normal.  Psychiatric:        Speech: Speech normal.        Behavior: Behavior normal.        Thought Content: Thought content normal.     ED Results / Procedures / Treatments   Labs (all labs ordered are listed, but only abnormal results are displayed) Labs Reviewed  BASIC METABOLIC PANEL - Abnormal; Notable for the following components:      Result Value   Glucose, Bld 138 (*)    BUN 32 (*)    All other components within normal limits  TROPONIN I (HIGH SENSITIVITY) - Abnormal; Notable for the following components:   Troponin I (High Sensitivity) 19 (*)    All other components within normal limits  TROPONIN I (HIGH SENSITIVITY) - Abnormal; Notable for the following components:   Troponin I (High Sensitivity) 23 (*)    All other components within normal limits  CBC WITH DIFFERENTIAL/PLATELET  URINALYSIS, ROUTINE W REFLEX MICROSCOPIC    EKG EKG Interpretation  Date/Time:  Monday November 12 2019 20:20:00 EDT Ventricular Rate:  80 PR Interval:  202 QRS Duration: 150 QT Interval:  412 QTC Calculation: 475 R Axis:   -59 Text Interpretation: Normal sinus rhythm Left axis deviation Left bundle branch block Abnormal ECG No significant change since last tracing Confirmed by Gilda Crease [phone]) on 11/13/2019 3:24:36 AM   Radiology No results found.  Procedures Procedures (including critical care time)  Medications Ordered in ED Medications  labetalol (NORMODYNE) injection 5 mg (has no administration  in time range)    ED Course  I have reviewed the triage vital signs and the nursing notes.  Pertinent labs & imaging results that were available during my care of the patient were reviewed by me and considered in my medical decision making (see chart for details).    MDM Rules/Calculators/A&P                      Patient presented to the emergency department for evaluation of elevated blood pressure.  Wife has been checking his blood pressure frequently over the last few days and has progressively increased.  Although patient does have a history of hypertension in his records, he is not currently on any medications for hypertension.  Wife reports that his blood pressures are normally within normal limits.   Blood pressures were 210/100 at home, 220/120 at arrival to the ER.  Blood pressure has slowly improved but are still elevated.  Patient not complaining of chest pain.  He does have a left bundle branch block which is chronic, EKG is therefore not helpful.  First troponin mildly elevated and second troponin elevated slightly more.  Will require hospitalization for further blood pressure monitoring and treatment as well as cycling cardiac enzymes further.  Final Clinical Impression(s) / ED Diagnoses Final diagnoses:  Hypertensive urgency    Rx / DC Orders ED Discharge Orders    None       Pollina, Canary Brim, MD 11/13/19 [phone]

## 2019-11-13 NOTE — ED Notes (Signed)
This RN attempted to call report for this pt; RN is unable to take report at this time and the RN will return my call

## 2019-11-13 NOTE — ED Notes (Signed)
Lunch Tray Ordered @ 1056. 

## 2019-11-13 NOTE — Progress Notes (Signed)
Attempted to call RN back for report.  No answer

## 2019-11-13 NOTE — H&P (Signed)
History and Physical    Randy Murphy VCB:[id number] DOB: 1931-09-30 DOA: 11/12/2019  PCP: Rodrigo Ran, MD  Patient coming from: Home.  History obtained from patient's wife as patient has advanced dementia.  Chief Complaint: Elevated blood pressure and increasing confusion.  HPI: Randy Murphy is a 86 y.o. male with history of chronic combined systolic and diastolic CHF, paroxysmal atrial fibrillation history of bradycardia Parkinson's disease advanced dementia bedbound with stage I sacral decubitus ulcers was brought to the ER after patient's wife found that patient's blood pressure was remaining very high which is unusual for the patient.  Patient blood pressure was checked yesterday morning and remained consistently high.  Patient's wife also states that patient has become very lethargic yesterday morning and remained so to the evening.  And he was back to baseline and at that time patient also was mildly short of breath.  Has not had any fever chills nausea vomiting diarrhea abdominal pain.  ED Course: In the ER patient blood pressure was around 192/80 which improved with IV labetalol.  On exam patient is alert awake and is able to move all extremities EKG shows sinus rhythm with LBBB which is chronic.  High sensitive troponin was 19 and 23.  Metabolic panel and CBC unremarkable.  CT head shows possible left-sided thalamic subacute infarct.  Neurologist on call advised to get MRI brain and if it shows stroke to have further stroke work-up and neurology consult.  UA and Covid test are pending.  Chest x-ray shows congestion.  Review of Systems: As per HPI, rest all negative.   Past Medical History:  Diagnosis Date  . Chronic combined systolic and diastolic CHF (congestive heart failure) (HCC)   . CKD (chronic kidney disease), stage III    a. stage II-III by labs, Cr 1.1-1.22 in 09/2017, 1.3-1.4 in 06/2018.  Marland Kitchen Dementia (HCC)   . Fatigue   . Frequent falls   . Hearing loss of both ears     . History of orthostatic hypotension   . Hyperlipidemia   . Hypersomnia, periodic 02/18/2014  . Hypertension   . Insomnia   . Junctional bradycardia   . Moderate aortic insufficiency   . Osteopenia   . PAF (paroxysmal atrial fibrillation) (HCC)    a. noted in hospital 06/2018, not on anticoag due to h/o frequent falling.  . Parkinson's disease    Possible early parkinsons disease  . Pulmonary hypertension (HCC)   . Sinus bradycardia   . Tricuspid regurgitation     Past Surgical History:  Procedure Laterality Date  . CATARACT EXTRACTION, BILATERAL    . NASAL SEPTUM SURGERY  1972     reports that he has never smoked. He has never used smokeless tobacco. He reports that he does not drink alcohol or use drugs.  No Known Allergies  Family History  Problem Relation Age of Onset  . Stroke Sister     Prior to Admission medications   Medication Sig Start Date End Date Taking? Authorizing Provider  carbidopa-levodopa (SINEMET CR) 50-200 MG tablet TAKE 1 TABLET BY MOUTH AT  BEDTIME 07/06/19  Yes Tat, Rebecca S, DO  Carbidopa-Levodopa ER (SINEMET CR) 25-100 MG tablet controlled release TAKE 2 TABLETS BY MOUTH 4  TIMES DAILY Patient taking differently: Take 1-2 tablets by mouth See admin instructions. Taking 2 at 6am and 2 at 12pm and 1 with supper. 06/05/19  Yes Tat, Octaviano Batty, DO  chlorhexidine (PERIDEX) 0.12 % solution 15 mLs by Mouth Rinse route 2 (  two) times daily. Rinse and spit 06/20/18  Yes [provider]  cholecalciferol (VITAMIN D3) 25 MCG (1000 UT) tablet Take 1,000 Units by mouth daily.   Yes [provider]  clonazePAM (KLONOPIN) 0.5 MG tablet Take 1 tablet (0.5 mg total) by mouth at bedtime. 07/14/18  Yes Berton Mount I, MD  Co-Enzyme Q-10 100 MG CAPS Take 200 mg by mouth at bedtime.    Yes [provider]  finasteride (PROSCAR) 5 MG tablet Take 5 mg by mouth daily.     Yes [provider]  ibandronate (BONIVA) 150 MG tablet Take 150  mg by mouth every 30 (thirty) days. Take in the morning with a full glass of water, on an empty stomach, and do not take anything else by mouth or lie down for the next 30 min.   Yes [provider]  Multiple Vitamins-Minerals (MULTIVITAMIN PO) Take 1 tablet by mouth daily.   Yes [provider]  Multiple Vitamins-Minerals (PRESERVISION AREDS 2 PO) Take 1 capsule by mouth 2 (two) times daily.   Yes [provider]  Pimavanserin Tartrate (NUPLAZID) 34 MG CAPS Take 1 capsule (34 mg total) by mouth daily. 10/17/19  Yes Tat, Octaviano Batty, DO  Probiotic Product (PROBIOTIC-10 PO) Take 1 tablet by mouth daily.   Yes [provider]  AMBULATORY NON FORMULARY MEDICATION Lift Chair DX: G20 08/03/18   TatOctaviano Batty, DO    Physical Exam: Constitutional: Moderately built and nourished. Vitals:   11/13/19 0400 11/13/19 0415 11/13/19 0528 11/13/19 0545  BP: (!) 160/73 (!) 166/83 (!) 173/74 (!) 156/76  Pulse: 67 65 65 64  Resp: 18 12 15 16   Temp:      TempSrc:      SpO2: 97% 98% (!) 84% 97%  Weight:      Height:       Eyes: Anicteric no pallor. ENMT: No discharge from the ears eyes nose or mouth. Neck: No mass felt.  No neck rigidity. Respiratory: No rhonchi or crepitations. Cardiovascular: S1-S2 heard. Abdomen: Soft nontender bowel sounds present. Musculoskeletal: No edema. Skin: Stage I sacral decubitus. Neurologic: Alert awake oriented to his name.  Moving all extremities at this time.  No obvious facial asymmetry tongue is midline pupils are reactive to light. Psychiatric: Has advanced dementia.   Labs on Admission: I have personally reviewed following labs and imaging studies  CBC: Recent Labs  Lab 11/12/19 2056  WBC 7.6  NEUTROABS 4.7  HGB 15.9  HCT 50.7  MCV 99.6  PLT 220   Basic Metabolic Panel: Recent Labs  Lab 11/12/19 2056  NA 142  K 4.5  CL 103  CO2 26  GLUCOSE 138*  BUN 32*  CREATININE 1.00  CALCIUM 9.5   GFR: Estimated  Creatinine Clearance: 58.2 mL/min (by C-G formula based on SCr of 1 mg/dL). Liver Function Tests: No results for input(s): AST, ALT, ALKPHOS, BILITOT, PROT, ALBUMIN in the last 168 hours. No results for input(s): LIPASE, AMYLASE in the last 168 hours. No results for input(s): AMMONIA in the last 168 hours. Coagulation Profile: No results for input(s): INR, PROTIME in the last 168 hours. Cardiac Enzymes: No results for input(s): CKTOTAL, CKMB, CKMBINDEX, TROPONINI in the last 168 hours. BNP (last 3 results) No results for input(s): PROBNP in the last 8760 hours. HbA1C: No results for input(s): HGBA1C in the last 72 hours. CBG: No results for input(s): GLUCAP in the last 168 hours. Lipid Profile: No results for input(s): CHOL, HDL, LDLCALC, TRIG,  CHOLHDL, LDLDIRECT in the last 72 hours. Thyroid Function Tests: No results for input(s): TSH, T4TOTAL, FREET4, T3FREE, THYROIDAB in the last 72 hours. Anemia Panel: No results for input(s): VITAMINB12, FOLATE, FERRITIN, TIBC, IRON, RETICCTPCT in the last 72 hours. Urine analysis:    Component Value Date/Time   COLORURINE AMBER (A) 10/03/2018 2031   APPEARANCEUR HAZY (A) 10/03/2018 2031   LABSPEC 1.023 10/03/2018 2031   PHURINE 5.0 10/03/2018 2031   GLUCOSEU NEGATIVE 10/03/2018 2031   HGBUR NEGATIVE 10/03/2018 2031   BILIRUBINUR NEGATIVE 10/03/2018 2031   KETONESUR 5 (A) 10/03/2018 2031   PROTEINUR NEGATIVE 10/03/2018 2031   NITRITE NEGATIVE 10/03/2018 2031   LEUKOCYTESUR NEGATIVE 10/03/2018 2031   Sepsis Labs: @LABRCNTIP (procalcitonin:4,lacticidven:4) )No results found for this or any previous visit (from the past 240 hour(s)).   Radiological Exams on Admission: CT HEAD WO CONTRAST  Result Date: 11/13/2019 CLINICAL DATA:  Encephalopathy, hypertension, frequent falls and dementia EXAM: CT HEAD WITHOUT CONTRAST TECHNIQUE: Contiguous axial images were obtained from the base of the skull through the vertex without intravenous contrast.  COMPARISON:  CT 10/03/2018 FINDINGS: Brain: A hypoattenuating focus in the left thalamus has a subacute to chronic appearance favoring remote lacunar infarct though is new since the October 03, 2018. Additional areas of lacunar infarcts are present in the bilateral basal ganglia particularly within the internal capsules and right caudate. No evidence of acute infarction, hemorrhage, hydrocephalus, extra-axial collection or mass lesion/mass effect. Symmetric prominence of the ventricles, cisterns and sulci compatible with parenchymal volume loss. Patchy areas of white matter hypoattenuation are most compatible with chronic microvascular angiopathy. Vascular: Atherosclerotic calcification of the carotid siphons and intradural vertebral arteries. No hyperdense vessel. Skull: No calvarial fracture or suspicious osseous lesion. No scalp swelling or hematoma. Sinuses/Orbits: There is circumferential mucosal thickening and hyperostotic changes of the right maxillary sinus with a slightly atelectatic appearance when compared to the contralateral maxillary sinus. Remaining paranasal sinuses and mastoid air cells are predominantly clear. Other: None IMPRESSION: 1. No acute intracranial abnormality. 2. Subacute to chronic lacunar infarct in the left thalamus is new since October 03, 2018. Additional areas of remote lacunar infarcts in the bilateral basal ganglia particularly within the internal capsules and right caudate. 3. Chronic microvascular angiopathy and mild parenchymal volume loss. 4. Chronic right maxillary sinusitis with features of sinus atelectasis. Electronically Signed   By: October 05, 2018 M.D.   On: 11/13/2019 06:17   DG CHEST PORT 1 VIEW  Result Date: 11/13/2019 CLINICAL DATA:  New hypertension, CHF EXAM: PORTABLE CHEST 1 VIEW COMPARISON:  Radiograph 10/03/2010 FINDINGS: Chronically coarsened interstitial changes are present with some central vascular congestion which could reflect mild interstitial edema.  Stable cardiomegaly. Atherosclerotic calcifications seen within the thoracic aorta. No pneumothorax or effusion. No acute osseous or soft tissue abnormality. Degenerative changes are present in the imaged spine and shoulders. Patient's chin obscures portions of the lung apices. IMPRESSION: Stable cardiomegaly with central vascular congestion with some hazy interstitium possibly reflecting mild edema. Aortic Atherosclerosis (ICD10-I70.0). Electronically Signed   By: 12/02/2010 M.D.   On: 11/13/2019 06:21    EKG: Independently reviewed.  Normal sinus rhythm with LBBB.  Assessment/Plan Principal Problem:   Acute encephalopathy Active Problems:   Parkinson disease (HCC)   Hypertensive urgency   Dementia (HCC)    1. Acute encephalopathy essentially improved at this time CT scan shows possible subacute infarct for which I discussed with neurologist on-call Dr. 11/15/2019 advised MRI brain.  If MRI brain does show a stroke  and will need further stroke work-up.  Stroke follow-up. 2. Hypertensive urgency for now we will keep patient on as needed IV hydralazine for systolic more than 325 and diastolic more than 498.  MRI brain does not show any stroke and will have further aggressive work-up. 3. Chronic combined systolic and diastolic CHF with mildly elevated troponin.  Does not complain of any chest pain but did have some mild shortness of breath.  Chest x-ray does show some congestion.  Will check 2D echo presently does not look like to be obvious fluid overload.  Closely observe. 4. Parkinson's disease on Sinemet will be continued. 5. History of hallucination presently on Nuplazid.  Also takes Klonopin at bedtime. 6. History of paroxysmal atrial fibrillation on anticoagulation per cardiology patient was not felt to be a candidate for anticoagulation.  Presently in sinus rhythm. 7. Advanced dementia. 8. Stage I sacral decubitus ulcer.  Covid test and UA and MRI brain are pending.   DVT prophylaxis:  Lovenox. Code Status: DNR confirmed with patient's wife. Family Communication: Patient's wife. Disposition Plan: To be determined. Consults called: Discussed with neurologist. Admission status: Observation.   Rise Patience MD [facility] Pager [phone].  If 7PM-7AM, please contact night-coverage [email or web address] Password [facility]  11/13/2019, 6:41 AM

## 2019-11-13 NOTE — ED Notes (Signed)
Pt returned to room from MRI.

## 2019-11-14 DIAGNOSIS — I10 Essential (primary) hypertension: Secondary | ICD-10-CM | POA: Diagnosis not present

## 2019-11-14 DIAGNOSIS — G20 Parkinson's disease: Secondary | ICD-10-CM

## 2019-11-14 DIAGNOSIS — G9340 Encephalopathy, unspecified: Secondary | ICD-10-CM | POA: Diagnosis not present

## 2019-11-14 DIAGNOSIS — I480 Paroxysmal atrial fibrillation: Secondary | ICD-10-CM

## 2019-11-14 DIAGNOSIS — L89151 Pressure ulcer of sacral region, stage 1: Secondary | ICD-10-CM

## 2019-11-14 DIAGNOSIS — F0280 Dementia in other diseases classified elsewhere without behavioral disturbance: Secondary | ICD-10-CM

## 2019-11-14 DIAGNOSIS — I5042 Chronic combined systolic (congestive) and diastolic (congestive) heart failure: Secondary | ICD-10-CM | POA: Diagnosis not present

## 2019-11-14 DIAGNOSIS — R5381 Other malaise: Secondary | ICD-10-CM

## 2019-11-14 LAB — BASIC METABOLIC PANEL
Anion gap: 12 (ref 5–15)
BUN: 35 mg/dL — ABNORMAL HIGH (ref 8–23)
CO2: 24 mmol/L (ref 22–32)
Calcium: 8.6 mg/dL — ABNORMAL LOW (ref 8.9–10.3)
Chloride: 105 mmol/L (ref 98–111)
Creatinine, Ser: 1.02 mg/dL (ref 0.61–1.24)
GFR calc Af Amer: >60 mL/min (ref >60)
GFR calc non Af Amer: >60 mL/min (ref >60)
Glucose, Bld: 98 mg/dL (ref 70–99)
Potassium: 3.8 mmol/L (ref 3.5–5.1)
Sodium: 141 mmol/L (ref 135–145)

## 2019-11-14 LAB — CBC
HCT: 39.2 % (ref 39.0–52.0)
Hemoglobin: 12.8 g/dL — ABNORMAL LOW (ref 13.0–17.0)
MCH: 31.9 pg (ref 26.0–34.0)
MCHC: 32.7 g/dL (ref 30.0–36.0)
MCV: 97.8 fL (ref 80.0–100.0)
Platelets: 196 10*3/uL (ref 150–400)
RBC: 4.01 MIL/uL — ABNORMAL LOW (ref 4.22–5.81)
RDW: 11.8 % (ref 11.5–15.5)
WBC: 6.8 10*3/uL (ref 4.0–10.5)
nRBC: 0 % (ref 0.0–0.2)

## 2019-11-14 MED ORDER — AMLODIPINE BESYLATE 5 MG PO TABS: 5.0000 mg | ORAL_TABLET | Freq: Every day | ORAL | 1 refills | Status: DC

## 2019-11-14 MED ORDER — ASPIRIN EC 81 MG PO TBEC
81.0000 mg | DELAYED_RELEASE_TABLET | Freq: Every day | ORAL | Status: DC
  Administered 2019-11-14: 81 mg via ORAL
  Filled 2019-11-14: qty 1

## 2019-11-14 MED FILL — AMLODIPINE BESYLATE 5 MG TA: 5 | 90 days supply | Qty: 90 | Fill #0

## 2019-11-14 NOTE — Care Management Obs Status (Signed)
MEDICARE OBSERVATION STATUS NOTIFICATION   Patient Details  Name: Randy Murphy MRN: [id number] Date of Birth: [birth year]   Medicare Observation Status Notification Given:  Yes    Baldemar Lenis, LCSW 11/14/2019, 11:55 AM

## 2019-11-14 NOTE — Plan of Care (Signed)
  Problem: Nutrition: Goal: Adequate nutrition will be maintained Outcome: Progressing   Problem: Safety: Goal: Ability to remain free from injury will improve Outcome: Progressing   

## 2019-11-14 NOTE — Discharge Summary (Signed)
Physician Discharge Summary  Randy Murphy:[id number] DOB: 1932-06-30 DOA: 11/12/2019  PCP: Rodrigo Ran, MD  Admit date: 11/12/2019 Discharge date: 11/14/2019  Admitted From: Home Disposition: Home  Recommendations for Outpatient Follow-up:  1. Follow ups as below. 2. CAD and adjust antihypertensive as appropriate. 3. Please obtain CBC/BMP/Mag at follow up 4. Please follow up on the following pending results: None  Home Health: PT/OT/RN Equipment/Devices: Patient has appropriate DME  Discharge Condition: Stable CODE STATUS: DNR/DNI  Follow-up Information    Rodrigo Ran, MD. Schedule an appointment as soon as possible for a visit in 1 week(s).   Specialty: Internal Medicine Contact information: [address] [place] [address] [phone]        [name], MD. Schedule an appointment as soon as possible for a visit in 2 week(s).   Specialty: Cardiology Contact information: [address] [place] [address] [phone]           Hospital Course: 86 year old male with history of chronic combined CHF, paroxysmal A. fib/bradycardia, Parkinson's disease, dementia, debility/bedbound with stage I sacral decubitus and essential HTN brought to ED by his wife out of concern for elevated blood pressure and lethargy.   In ED, hypertensive to 192/80.  CBC and metabolic panel without significant finding.  EKG sinus rhythm with LBBB (chronic).  High-sensitivity troponin 19> 23> 25> 24.  COVID-19 negative.  CXR with cardiomegaly and questionable mild pulmonary edema.  UA with 5 ketones.  CT head was concerning for left thalamic subacute infarct.  Neurology consulted and advised MRI brain, and further CVA work-up.  MRI brain positive for CVA.   MRI brain negative for acute or subacute infarct.  Patient's blood pressure improved.  Mental status improved although he had some hypoactive delirium.   Patient was evaluated by PT/OT who recommended  home health with DME.  Patient already with 24-hour care and appropriate DME including [facility] lift.   See individual problem list below for more hospital course.  Discharge Diagnoses:  Acute metabolic encephalopathy in patient with dementia and Parkinson disease-suspect hypoactive delirium -MRI brain negative for acute or subacute infarct. -Discharged home with home health.  Patient has 24-hour care and appropriate DME per wife.  Uncontrolled hypertension: Improved with amlodipine 5 mg daily. -Discharged on amlodipine 5 mg daily  Chronic combined CHF: Echo with EF of 35 to 40%, global hypokinesis and G1 DD (unchanged from prior in 2019).  CXR with questionable mild pulmonary edema.  Patient has no respiratory distress.  Appears euvolemic on exam.  BNP lower than baseline.  Not on diuretics at home. -Recommend outpatient follow-up with cardiology in 1 to 2 weeks  History of paroxysmal A. Fib/bradycardia-not on medication. -Per his cardiologist  History of Parkinson disease/dementia/anxiety -Continue home Sinemet and clonazepam.  History of BPH without LUTS -Continue home finasteride   Debility/physical deconditioning: Patient is bedbound at baseline. -Resume home health  Stage I sacral decubitus ulcer: POA -Close monitoring and frequent turning  Family communication: Updated patient's wife at bedside on the day of discharge.  She believes he would do better if he goes home from mental status standpoint.   Discharge Instructions  Discharge Instructions    Call MD for:  difficulty breathing, headache or visual disturbances   Complete by: As directed    Call MD for:  extreme fatigue   Complete by: As directed    Call MD for:  persistant dizziness or light-headedness   Complete by: As directed  Call MD for:  persistant nausea and vomiting   Complete by: As directed    Call MD for:  severe uncontrolled pain   Complete by: As directed    Call MD for:  temperature >100.4    Complete by: As directed    Diet general   Complete by: As directed    Discharge instructions   Complete by: As directed    It has been a pleasure taking care of you! You were hospitalized with elevated blood pressure and lethargy.  Your MRI did not show stroke.  We have started your blood pressure medication.  With that, your blood pressure improved.  We are discharging you on this blood pressure medication to continue taking at home.  Please review your new medication list and the directions before you take your medications.   Please follow-up with your primary care doctor in 1 to 2 weeks.   Take care,   Increase activity slowly   Complete by: As directed      Allergies as of 11/14/2019   No Known Allergies     Medication List    STOP taking these medications   MULTIVITAMIN PO     TAKE these medications   AMBULATORY NON FORMULARY MEDICATION Lift Chair DX: G20   amLODipine 5 MG tablet Commonly known as: NORVASC Take 1 tablet (5 mg total) by mouth daily.   Carbidopa-Levodopa ER 25-100 MG tablet controlled release Commonly known as: SINEMET CR TAKE 2 TABLETS BY MOUTH 4  TIMES DAILY What changed: See the new instructions.   carbidopa-levodopa 50-200 MG tablet Commonly known as: SINEMET CR TAKE 1 TABLET BY MOUTH AT  BEDTIME What changed: Another medication with the same name was changed. Make sure you understand how and when to take each.   chlorhexidine 0.12 % solution Commonly known as: PERIDEX 15 mLs by Mouth Rinse route 2 (two) times daily. Rinse and spit   cholecalciferol 25 MCG (1000 UNIT) tablet Commonly known as: VITAMIN D3 Take 1,000 Units by mouth daily.   clonazePAM 0.5 MG tablet Commonly known as: KLONOPIN Take 1 tablet (0.5 mg total) by mouth at bedtime.   Co-Enzyme Q-10 100 MG Caps Take 200 mg by mouth at bedtime.   finasteride 5 MG tablet Commonly known as: PROSCAR Take 5 mg by mouth daily.   ibandronate 150 MG tablet Commonly known as:  BONIVA Take 150 mg by mouth every 30 (thirty) days. Take in the morning with a full glass of water, on an empty stomach, and do not take anything else by mouth or lie down for the next 30 min.   Nuplazid 34 MG Caps Generic drug: Pimavanserin Tartrate Take 1 capsule (34 mg total) by mouth daily.   PRESERVISION AREDS 2 PO Take 1 capsule by mouth 2 (two) times daily.   PROBIOTIC-10 PO Take 1 tablet by mouth daily.       Consultations:  Neurology  Procedures/Studies:  2D Echo on 11/13/2019 1. Left ventricular ejection fraction, by estimation, is 35 to 40%. The  left ventricle has moderately decreased function. The left ventricle  demonstrates global hypokinesis. Left ventricular diastolic parameters are  consistent with Grade I diastolic  dysfunction (impaired relaxation). Elevated left ventricular end-diastolic  pressure.  2. Right ventricular systolic function is low normal. The right  ventricular size is not well visualized.  3. The mitral valve is abnormal. Trivial mitral valve regurgitation.  4. The aortic valve is tricuspid. Aortic valve regurgitation is trivial.  Mild aortic valve sclerosis  is present, with no evidence of aortic valve  stenosis.   Comparison(s): No significant change from prior study. 07/10/2018: LVEF  35-40%.    CT HEAD WO CONTRAST  Result Date: 11/13/2019 CLINICAL DATA:  Encephalopathy, hypertension, frequent falls and dementia EXAM: CT HEAD WITHOUT CONTRAST TECHNIQUE: Contiguous axial images were obtained from the base of the skull through the vertex without intravenous contrast. COMPARISON:  CT 10/03/2018 FINDINGS: Brain: A hypoattenuating focus in the left thalamus has a subacute to chronic appearance favoring remote lacunar infarct though is new since the October 03, 2018. Additional areas of lacunar infarcts are present in the bilateral basal ganglia particularly within the internal capsules and right caudate. No evidence of acute infarction,  hemorrhage, hydrocephalus, extra-axial collection or mass lesion/mass effect. Symmetric prominence of the ventricles, cisterns and sulci compatible with parenchymal volume loss. Patchy areas of white matter hypoattenuation are most compatible with chronic microvascular angiopathy. Vascular: Atherosclerotic calcification of the carotid siphons and intradural vertebral arteries. No hyperdense vessel. Skull: No calvarial fracture or suspicious osseous lesion. No scalp swelling or hematoma. Sinuses/Orbits: There is circumferential mucosal thickening and hyperostotic changes of the right maxillary sinus with a slightly atelectatic appearance when compared to the contralateral maxillary sinus. Remaining paranasal sinuses and mastoid air cells are predominantly clear. Other: None IMPRESSION: 1. No acute intracranial abnormality. 2. Subacute to chronic lacunar infarct in the left thalamus is new since October 03, 2018. Additional areas of remote lacunar infarcts in the bilateral basal ganglia particularly within the internal capsules and right caudate. 3. Chronic microvascular angiopathy and mild parenchymal volume loss. 4. Chronic right maxillary sinusitis with features of sinus atelectasis. Electronically Signed   By: Kreg Shropshire M.D.   On: 11/13/2019 06:17   MR BRAIN WO CONTRAST  Result Date: 11/13/2019 CLINICAL DATA:  Focal neuro deficit, greater than 6 hours, stroke suspected. Additional history provided: Encephalopathy, hypertension, frequent falls and dementia. EXAM: MRI HEAD WITHOUT CONTRAST TECHNIQUE: Multiplanar, multiecho pulse sequences of the brain and surrounding structures were obtained without intravenous contrast. COMPARISON:  Prior head CT examinations 11/13/2019 and earlier, brain MRI 03/28/2014 FINDINGS: Brain: Susceptibility artifact arising from the face region obscures portions of the anterior intracranial contents on several sequences, including the diffusion-weighted imaging. Additionally,  multiple sequences are motion degraded. Most notably there is moderate motion degradation of the axial and coronal diffusion-weighted imaging and moderate motion degradation of the axial T1 weighted sequence. There is no evidence of acute infarct. No evidence of intracranial mass. No midline shift or extra-axial fluid collection. No chronic intracranial blood products. There are chronic lacunar infarcts within the bilateral basal ganglia, internal capsules and left thalamus. Background moderate patchy and confluent T2/FLAIR hyperintensity within the cerebral white matter is nonspecific, but consistent with chronic small vessel ischemic disease. To a lesser degree, chronic small vessel ischemic changes are also present within the pons. Moderate generalized parenchymal atrophy. Vascular: Flow voids maintained within the proximal large arterial vessels. Skull and upper cervical spine: No focal marrow lesion. Sinuses/Orbits: Visualized orbits demonstrate no acute abnormality. Mild ethmoid and right maxillary sinus mucosal thickening. Trace fluid within left mastoid air cells IMPRESSION: Examination limited by susceptibility artifact arising from the face region and motion degradation. No evidence of acute intracranial abnormality. Moderate generalized parenchymal atrophy and chronic small vessel ischemic disease. Chronic lacunar infarcts within the bilateral basal ganglia, internal capsules and left thalamus. Mild paranasal sinus mucosal thickening. Electronically Signed   By: Jackey Loge DO   On: 11/13/2019 09:48   DG  CHEST PORT 1 VIEW  Result Date: 11/13/2019 CLINICAL DATA:  New hypertension, CHF EXAM: PORTABLE CHEST 1 VIEW COMPARISON:  Radiograph 10/03/2010 FINDINGS: Chronically coarsened interstitial changes are present with some central vascular congestion which could reflect mild interstitial edema. Stable cardiomegaly. Atherosclerotic calcifications seen within the thoracic aorta. No pneumothorax or  effusion. No acute osseous or soft tissue abnormality. Degenerative changes are present in the imaged spine and shoulders. Patient's chin obscures portions of the lung apices. IMPRESSION: Stable cardiomegaly with central vascular congestion with some hazy interstitium possibly reflecting mild edema. Aortic Atherosclerosis (ICD10-I70.0). Electronically Signed   By: Kreg Shropshire M.D.   On: 11/13/2019 06:21   ECHOCARDIOGRAM COMPLETE  Result Date: 11/13/2019    ECHOCARDIOGRAM REPORT   Patient Name:   Randy Murphy Date of Exam: 11/13/2019 Medical Rec #:  [id number]      Height:       72.0 in Accession #:    [id number]     Weight:       190.0 lb Date of Birth:  1932/03/03       BSA:          2.085 m Patient Age:    88 years       BP:           162/71 mmHg Patient Gender: M              HR:           60 bpm. Exam Location:  Inpatient Procedure: 2D Echo, Color Doppler and Cardiac Doppler Indications:    I50.20* Unspecified systolic (congestive) heart failure  History:        Patient has prior history of Echocardiogram examinations, most                 recent 07/10/2018. CHF; Risk Factors:Hypertension.  Sonographer:    Renella Cunas RDCS Referring Phys: [id number] [facility] IMPRESSIONS  1. Left ventricular ejection fraction, by estimation, is 35 to 40%. The left ventricle has moderately decreased function. The left ventricle demonstrates global hypokinesis. Left ventricular diastolic parameters are consistent with Grade I diastolic dysfunction (impaired relaxation). Elevated left ventricular end-diastolic pressure.  2. Right ventricular systolic function is low normal. The right ventricular size is not well visualized.  3. The mitral valve is abnormal. Trivial mitral valve regurgitation.  4. The aortic valve is tricuspid. Aortic valve regurgitation is trivial. Mild aortic valve sclerosis is present, with no evidence of aortic valve stenosis. Comparison(s): No significant change from prior study. 07/10/2018: LVEF  35-40%. FINDINGS  Left Ventricle: Left ventricular ejection fraction, by estimation, is 35 to 40%. The left ventricle has moderately decreased function. The left ventricle demonstrates global hypokinesis. The left ventricular internal cavity size was normal in size. There is no left ventricular hypertrophy. Left ventricular diastolic parameters are consistent with Grade I diastolic dysfunction (impaired relaxation). Elevated left ventricular end-diastolic pressure. Right Ventricle: The right ventricular size is not well visualized. Right vetricular wall thickness was not assessed. Right ventricular systolic function is low normal. Left Atrium: Left atrial size was normal in size. Right Atrium: Right atrial size was normal in size. Pericardium: There is no evidence of pericardial effusion. Mitral Valve: The mitral valve is abnormal. There is mild thickening of the mitral valve leaflet(s). Trivial mitral valve regurgitation. Tricuspid Valve: The tricuspid valve is grossly normal. Tricuspid valve regurgitation is trivial. Aortic Valve: The aortic valve is tricuspid. Aortic valve regurgitation is trivial. Aortic regurgitation PHT measures 770 msec. Mild aortic  valve sclerosis is present, with no evidence of aortic valve stenosis. Pulmonic Valve: The pulmonic valve was grossly normal. Pulmonic valve regurgitation is not visualized. Aorta: The aortic root and ascending aorta are structurally normal, with no evidence of dilitation. Venous: The inferior vena cava was not well visualized. IAS/Shunts: No atrial level shunt detected by color flow Doppler.  LEFT VENTRICLE PLAX 2D LVIDd:         5.42 cm      Diastology LVIDs:         4.66 cm      LV e' lateral:   3.00 cm/s LV PW:         0.89 cm      LV E/e' lateral: 13.7 LV IVS:        0.88 cm      LV e' medial:    2.35 cm/s LVOT diam:     2.40 cm      LV E/e' medial:  17.5 LV SV:         71 LV SV Index:   34 LVOT Area:     4.52 cm  LV Volumes (MOD) LV vol d, MOD A2C: 217.0 ml  LV vol d, MOD A4C: 218.0 ml LV vol s, MOD A2C: 143.0 ml LV vol s, MOD A4C: 144.0 ml LV SV MOD A2C:     74.0 ml LV SV MOD A4C:     218.0 ml LV SV MOD BP:      74.1 ml RIGHT VENTRICLE RV S prime:     9.73 cm/s TAPSE (M-mode): 1.5 cm LEFT ATRIUM             Index       RIGHT ATRIUM          Index LA diam:        3.30 cm 1.58 cm/m  RA Area:     9.88 cm LA Vol (A2C):   46.8 ml 22.45 ml/m RA Volume:   15.40 ml 7.39 ml/m LA Vol (A4C):   65.9 ml 31.61 ml/m LA Biplane Vol: 61.3 ml 29.41 ml/m  AORTIC VALVE LVOT Vmax:   72.30 cm/s LVOT Vmean:  47.500 cm/s LVOT VTI:    0.158 m AI PHT:      770 msec  AORTA Ao Root diam: 3.70 cm Ao Asc diam:  3.50 cm MITRAL VALVE               TRICUSPID VALVE MV Area (PHT): 3.77 cm    TR Peak grad:   24.8 mmHg MV Decel Time: 201 msec    TR Vmax:        249.00 cm/s MV E velocity: 41.10 cm/s MV A velocity: 95.10 cm/s  SHUNTS MV E/A ratio:  0.43        Systemic VTI:  0.16 m                            Systemic Diam: 2.40 cm Zoila Shutter MD Electronically signed by Zoila Shutter MD Signature Date/Time: 11/13/2019/11:58:04 AM    Final        Discharge Exam: Vitals:   11/14/19 0423 11/14/19 0800  BP: (!) 153/65 135/60  Pulse: 64 68  Resp: 16 18  Temp: 98.1 F (36.7 C) 98.2 F (36.8 C)  SpO2: 96% 95%    GENERAL: No apparent distress.  Nontoxic. HEENT: MMM.  Vision and hearing grossly intact.  NECK: Supple.  No apparent JVD.  RESP:  No IWOB.  Fair aeration bilaterally. CVS:  RRR. Heart sounds normal.  ABD/GI/GU: Bowel sounds present. Soft. Non tender.  MSK/EXT:  Moves extremities. No apparent deformity or edema.  SKIN: no apparent skin lesion or wound NEURO: Awake, alert and oriented to self only..  No apparent focal neuro deficit other than BLE weakness PSYCH: Calm. Normal affect.   The results of significant diagnostics from this hospitalization (including imaging, microbiology, ancillary and laboratory) are listed below for reference.     Microbiology: Recent  Results (from the past 240 hour(s))  SARS CORONAVIRUS 2 (TAT 6-24 HRS) Nasopharyngeal Nasopharyngeal Swab     Status: None   Collection Time: 11/13/19  5:35 AM   Specimen: Nasopharyngeal Swab  Result Value Ref Range Status   SARS Coronavirus 2 NEGATIVE NEGATIVE Final    Comment: (NOTE) SARS-CoV-2 target nucleic acids are NOT DETECTED. The SARS-CoV-2 RNA is generally detectable in upper and lower respiratory specimens during the acute phase of infection. Negative results do not preclude SARS-CoV-2 infection, do not rule out co-infections with other pathogens, and should not be used as the sole basis for treatment or other patient management decisions. Negative results must be combined with clinical observations, patient history, and epidemiological information. The expected result is Negative. Fact Sheet for Patients: HairSlick.nohttps://www.fda.gov/media/138098/download Fact Sheet for Healthcare Providers: quierodirigir.comhttps://www.fda.gov/media/138095/download This test is not yet approved or cleared by the [place] FDA and  has been authorized for detection and/or diagnosis of SARS-CoV-2 by FDA under an Emergency Use Authorization (EUA). This EUA will remain  in effect (meaning this test can be used) for the duration of the COVID-19 declaration under Section 56 4(b)(1) of the Act, 21 U.S.C. section 360bbb-3(b)(1), unless the authorization is terminated or revoked sooner. Performed at [facility], [address] [place], [place] [address]      Labs: BNP (last 3 results) Recent Labs    11/13/19 0800  BNP 180.8*   Basic Metabolic Panel: Recent Labs  Lab 11/12/19 2056 11/13/19 0800 11/14/19 0345  NA 142  --  141  K 4.5  --  3.8  CL 103  --  105  CO2 26  --  24  GLUCOSE 138*  --  98  BUN 32*  --  35*  CREATININE 1.00 1.11 1.02  CALCIUM 9.5  --  8.6*   Liver Function Tests: No results for input(s): AST, ALT, ALKPHOS, BILITOT, PROT, ALBUMIN in the last 168 hours. No results for  input(s): LIPASE, AMYLASE in the last 168 hours. No results for input(s): AMMONIA in the last 168 hours. CBC: Recent Labs  Lab 11/12/19 2056 11/13/19 0800 11/14/19 0345  WBC 7.6 8.7 6.8  NEUTROABS 4.7  --   --   HGB 15.9 13.9 12.8*  HCT 50.7 43.4 39.2  MCV 99.6 98.4 97.8  PLT 220 196 196   Cardiac Enzymes: No results for input(s): CKTOTAL, CKMB, CKMBINDEX, TROPONINI in the last 168 hours. BNP: Invalid input(s): POCBNP CBG: No results for input(s): GLUCAP in the last 168 hours. D-Dimer No results for input(s): DDIMER in the last 72 hours. Hgb A1c No results for input(s): HGBA1C in the last 72 hours. Lipid Profile No results for input(s): CHOL, HDL, LDLCALC, TRIG, CHOLHDL, LDLDIRECT in the last 72 hours. Thyroid function studies No results for input(s): TSH, T4TOTAL, T3FREE, THYROIDAB in the last 72 hours.  Invalid input(s): FREET3 Anemia work up No results for input(s): VITAMINB12, FOLATE, FERRITIN, TIBC, IRON, RETICCTPCT in the last 72 hours. Urinalysis  Component Value Date/Time   COLORURINE YELLOW 11/13/2019 1010   APPEARANCEUR HAZY (A) 11/13/2019 1010   LABSPEC 1.021 11/13/2019 1010   PHURINE 5.0 11/13/2019 1010   GLUCOSEU NEGATIVE 11/13/2019 1010   HGBUR NEGATIVE 11/13/2019 1010   BILIRUBINUR NEGATIVE 11/13/2019 1010   KETONESUR 5 (A) 11/13/2019 1010   PROTEINUR NEGATIVE 11/13/2019 1010   NITRITE NEGATIVE 11/13/2019 1010   LEUKOCYTESUR NEGATIVE 11/13/2019 1010   Sepsis Labs Invalid input(s): PROCALCITONIN,  WBC,  LACTICIDVEN   Time coordinating discharge: 35 minutes  SIGNED:  Mercy Riding, MD  [facility] 11/14/2019, 10:26 AM  If 7PM-7AM, please contact night-coverage [email or web address] Password [email or web address]

## 2019-11-14 NOTE — Plan of Care (Signed)
  Problem: Education: Goal: Knowledge of General Education information will improve Description: Including pain rating scale, medication(s)/side effects and non-pharmacologic comfort measures 11/14/2019 1040 by Dallas Breeding, RN Outcome: Adequate for Discharge 11/14/2019 0819 by Dallas Breeding, RN Outcome: Progressing

## 2019-11-14 NOTE — TOC Transition Note (Signed)
Transition of Care [facility]) - CM/SW Discharge Note   Patient Details  Name: Randy Murphy MRN: [id number] Date of Birth: 1932/08/06  [facility] CM/SW Contact:  Kermit Balo, RN Phone Number: 11/14/2019, 12:59 PM   Clinical Narrative:    Pt discharging home with [facility] services through [facility]. Tiffany with [facility] aware and accepted the referral.  Pt needs [name] home. Wife at bedside and aware of timing. Bedside RN also aware and will update when [name] can be arranged. D/c packet at the desk.    Final next level of care: Home w Home Health Services Barriers to Discharge: No Barriers Identified   Patient Goals and CMS Choice   CMS Medicare.gov Compare Post Acute Care list provided to:: Patient Represenative (must comment) Choice offered to / list presented to : Spouse  Discharge Placement                       Discharge Plan and Services                          HH Arranged: PT, OT, RN, Nurse's Aide HH Agency: [facility] ([facility]) Date HH Agency Contacted: 11/14/19   Representative spoke with at [facility] Agency: Tiffany  Social Determinants of Health (SDOH) Interventions     Readmission Risk Interventions No flowsheet data found.

## 2019-11-14 NOTE — Progress Notes (Signed)
Occupational Therapy Evaluation Patient Details Name: Randy Murphy MRN: [id number] DOB: [birth year] Today's Date: 11/14/2019    History of Present Illness 86 y.o. male with history of chronic combined systolic and diastolic CHF, paroxysmal atrial fibrillation history of bradycardia Parkinson's disease advanced dementia bedbound with stage I sacral decubitus ulcers was brought to the ER after patient's wife found that patient was hypertensive, lethargic, and mildly SOB.    Clinical Impression   PTA, pt had 24/7 caregivers who assist with mobility and self care. Wife reports pt demonstrates decreased use of L hand and more difficulty overall with dexterity B hands. Educated on positioning, pressure relief and use of moisture barrier cream to reduce sacral pressure and moisture associated injuries associated with incontinence. Wife has concerns about hospital bed and wc which will need to be addressed by [facility] therapists. All further needs to be addressed by HHOT.     Follow Up Recommendations  Home health OT;Supervision/Assistance - 24 hour    Equipment Recommendations  None recommended by OT    Recommendations for Other Services       Precautions / Restrictions Precautions Precautions: Fall Restrictions Weight Bearing Restrictions: No      Mobility Bed Mobility Overal bed mobility: Needs Assistance Bed Mobility: Supine to Sit;Sit to Supine;Rolling Rolling: Min assist   Supine to sit: Max assist Sit to supine: Max assist   General bed mobility comments: PCA assists with mobility and transfers  Transfers Not attempted. Will benefit from use of Stedy                    Balance Overall balance assessment: Needs assistance Sitting-balance support: Bilateral upper extremity supported;Feet supported Sitting balance-Leahy Scale: Poor Sitting balance - Comments: minG-modA Postural control: Posterior lean                                 ADL either performed  or assessed with clinical judgement   ADL Overall ADL's : Needs assistance/impaired Eating/Feeding: Minimal assistance Eating/Feeding Details (indicate cue type and reason): Educated wife on availability of special spoon to compensate for tremors; does better with finger foods; needs sturdy cup with lid Grooming: Moderate assistance Grooming Details (indicate cue type and reason): Able to brush teeth however needs assistance to complete Upper Body Bathing: Moderate assistance;Bed level   Lower Body Bathing: Maximal assistance;Bed level   Upper Body Dressing : Maximal assistance;Bed level   Lower Body Dressing: Maximal assistance;Bed level                       Vision         Perception     Praxis      Pertinent Vitals/Pain Pain Assessment: Faces Faces Pain Scale: No hurt     Hand Dominance Right   Extremity/Trunk Assessment Upper Extremity Assessment Upper Extremity Assessment: RUE deficits/detail;LUE deficits/detail RUE Deficits / Details: intension tremors; generalized weakness; ablet o bring hand to mouth; decreased in-hand manipulation skills RUE Coordination: decreased fine motor LUE Deficits / Details: appeasr weaker than R; decreased spontaneous use; decreased in-hand manipulation skills - more impaired than R LUE Coordination: decreased fine motor;decreased gross motor   Lower Extremity Assessment Lower Extremity Assessment: Defer to PT evaluation   Cervical / Trunk Assessment Cervical / Trunk Assessment: Other exceptions(R bias with lateral cervical flexion)   Communication Communication Communication: HOH   Cognition Arousal/Alertness: Awake/alert Behavior During Therapy: Flat affect Overall  Cognitive Status: History of cognitive impairments - at baseline                                 General Comments: per wife; pt at baseline   General Comments  Educated wife on frequent repositioning and turning q 2 hours and use of moisture  barrier cream to reduce moisture related injury; recommend HHOT further assess wc cushion    Exercises Exercises: Other exercises Other Exercises Other Exercises: Educated wife on use of familiar objects for pt to use to pick up and manipulate   Shoulder Instructions      Home Living Family/patient expects to be discharged to:: Private residence Living Arrangements: Spouse/significant other Available Help at Discharge: Personal care attendant;Available 24 hours/day Type of Home: House Home Access: Ramped entrance     Home Layout: One level     Bathroom Shower/Tub: Teacher, early years/pre: Standard Bathroom Accessibility: Yes How Accessible: Accessible via walker Home Equipment: Walker - 2 wheels;Transport chair;Other (comment);Hospital bed(mechanical lift)          Prior Functioning/Environment Level of Independence: Needs assistance  Gait / Transfers Assistance Needed: pt performs sit to stand transfers to assist with ADLs, otherwise is hoyer lifted for all transfers ADL's / Homemaking Assistance Needed: pt is able to feed himself, requires significant assist with all ADLs Communication / Swallowing Assistance Needed: spouse reports pt has been having more difficulty speaking, hypophonation          OT Problem List: Decreased strength;Decreased range of motion;Decreased activity tolerance;Impaired balance (sitting and/or standing);Decreased coordination;Decreased cognition;Decreased safety awareness;Decreased knowledge of use of DME or AE;Impaired UE functional use      OT Treatment/Interventions:      OT Goals(Current goals can be found in the care plan section) Acute Rehab OT Goals Patient Stated Goal: To transfer in and out of car OT Goal Formulation: All assessment and education complete, DC therapy(continue with HHOT)  OT Frequency:     Barriers to D/C:            Co-evaluation              AM-PAC OT "6 Clicks" Daily Activity     Outcome  Measure Help from another person eating meals?: A Lot Help from another person taking care of personal grooming?: A Lot Help from another person toileting, which includes using toliet, bedpan, or urinal?: Total Help from another person bathing (including washing, rinsing, drying)?: A Lot Help from another person to put on and taking off regular upper body clothing?: A Lot Help from another person to put on and taking off regular lower body clothing?: Total 6 Click Score: 10   End of Session Nurse Communication: Mobility status;Other (comment)(DC needs)  Activity Tolerance: Patient limited by fatigue(Just worked with PT) Patient left: in bed;with call bell/phone within reach;with bed alarm set;with family/visitor present  OT Visit Diagnosis: Unsteadiness on feet (R26.81);Other abnormalities of gait and mobility (R26.89);Muscle weakness (generalized) (M62.81);Other symptoms and signs involving cognitive function                Time: 0177-9390 OT Time Calculation (min): 27 min Charges:  OT General Charges $OT Visit: 1 Visit OT Evaluation $OT Eval Low Complexity: 1 Low OT Treatments $Self Care/Home Management : 8-22 mins  Maurie Boettcher, OT/L   Acute OT Clinical Specialist Acute Rehabilitation Services Pager [phone] Office [phone]   [facility] 11/14/2019, 11:49 AM

## 2019-11-14 NOTE — Evaluation (Addendum)
Physical Therapy Evaluation Patient Details Name: Randy Murphy MRN: [id number] DOB: 1932/07/29 Today's Date: 11/14/2019   History of Present Illness  86 y.o. male with history of chronic combined systolic and diastolic CHF, paroxysmal atrial fibrillation history of bradycardia Parkinson's disease advanced dementia bedbound with stage I sacral decubitus ulcers was brought to the ER after patient's wife found that patient was hypertensive, lethargic, and mildly SOB.   Clinical Impression  Pt presents to PT with significant deficits in functional mobility, strength, power, motor control, balance, gait. At baseline pt performs sit to stands with assistance and can roll in bed but requires max-totalA for all other mobility with use of hoyer lift for all transfers. Pt's spouse with goal of transferring into/out of car to improve community mobility. PT suggests the use of sit to stand device to aide in transfers upon return home. PT also recommends discharge home with HHPT. Pt will continue to benefit from acute POT POC to improve standing and sitting balance.    Follow Up Recommendations Home health PT;Supervision/Assistance - 24 hour- pt's spouse reports they prefer [facility] and would like to request Pilar Plate as their therapist if possible.    Equipment Recommendations  (PT suggests transfer or sit to stand assist device)    Recommendations for Other Services       Precautions / Restrictions Precautions Precautions: Fall Restrictions Weight Bearing Restrictions: No      Mobility  Bed Mobility Overal bed mobility: Needs Assistance Bed Mobility: Supine to Sit;Sit to Supine;Rolling Rolling: Min assist   Supine to sit: Max assist Sit to supine: Max assist      Transfers Overall transfer level: (pt unable to lean forward enough to safely transfer)                  Ambulation/Gait                Stairs            Wheelchair Mobility    Modified Rankin (Stroke  Patients Only)       Balance Overall balance assessment: Needs assistance Sitting-balance support: Bilateral upper extremity supported;Feet supported Sitting balance-Leahy Scale: Poor Sitting balance - Comments: minG-modA Postural control: Posterior lean                                   Pertinent Vitals/Pain Pain Assessment: No/denies pain    Home Living Family/patient expects to be discharged to:: Private residence Living Arrangements: Spouse/significant other Available Help at Discharge: Personal care attendant;Available 24 hours/day Type of Home: House Home Access: Ramped entrance     Home Layout: One level Home Equipment: Walker - 2 wheels;Transport chair;Other (comment);Hospital bed(mechanical lift)      Prior Function Level of Independence: Needs assistance   Gait / Transfers Assistance Needed: pt performs sit to stand transfers to assist with ADLs, otherwise is hoyer lifted for all transfers  ADL's / Homemaking Assistance Needed: pt is able to feed himself, requires significant assist with all ADLs        Hand Dominance        Extremity/Trunk Assessment   Upper Extremity Assessment Upper Extremity Assessment: Defer to OT evaluation    Lower Extremity Assessment Lower Extremity Assessment: Generalized weakness    Cervical / Trunk Assessment Cervical / Trunk Assessment: Normal  Communication   Communication: HOH  Cognition Arousal/Alertness: Awake/alert Behavior During Therapy: WFL for tasks assessed/performed Overall Cognitive Status:  History of cognitive impairments - at baseline                                        General Comments General comments (skin integrity, edema, etc.): VSS    Exercises     Assessment/Plan    PT Assessment Patient needs continued PT services  PT Problem List Decreased strength;Decreased range of motion;Decreased activity tolerance;Decreased balance;Decreased mobility;Decreased  cognition;Decreased knowledge of use of DME;Decreased safety awareness;Decreased knowledge of precautions       PT Treatment Interventions DME instruction;Functional mobility training;Therapeutic activities;Therapeutic exercise;Balance training;Neuromuscular re-education;Patient/family education;Wheelchair mobility training    PT Goals (Current goals can be found in the Care Plan section)  Acute Rehab PT Goals Patient Stated Goal: To transfer in and out of car PT Goal Formulation: With patient/family Time For Goal Achievement: 11/28/19 Potential to Achieve Goals: Poor    Frequency Min 2X/week   Barriers to discharge        Co-evaluation               AM-PAC PT "6 Clicks" Mobility  Outcome Measure Help needed turning from your back to your side while in a flat bed without using bedrails?: A Little Help needed moving from lying on your back to sitting on the side of a flat bed without using bedrails?: Total Help needed moving to and from a bed to a chair (including a wheelchair)?: Total Help needed standing up from a chair using your arms (e.g., wheelchair or bedside chair)?: Total Help needed to walk in hospital room?: Total Help needed climbing 3-5 steps with a railing? : Total 6 Click Score: 8    End of Session   Activity Tolerance: Patient tolerated treatment well Patient left: in bed;with call bell/phone within reach;with bed alarm set;with family/visitor present Nurse Communication: Mobility status;Need for lift equipment PT Visit Diagnosis: Muscle weakness (generalized) (M62.81)    Time: 0822-0904 PT Time Calculation (min) (ACUTE ONLY): 42 min   Charges:   PT Evaluation $PT Eval Moderate Complexity: 1 Mod PT Treatments $Therapeutic Activity: 8-22 mins        Arlyss Gandy, PT, DPT Acute Rehabilitation Pager: [phone]   Arlyss Gandy 11/14/2019, 9:16 AM

## 2019-11-14 NOTE — Progress Notes (Signed)
Patient discharged home with home health.  IV removed with the catheter intact.  Discharge instructions and prescription information given to the patient who verbalized understanding.

## 2019-12-11 ENCOUNTER — Other Ambulatory Visit: Payer: Self-pay

## 2019-12-11 ENCOUNTER — Encounter: Payer: Self-pay | Admitting: Cardiology

## 2019-12-11 ENCOUNTER — Ambulatory Visit: Payer: Medicare Other | Admitting: Cardiology

## 2019-12-11 VITALS — BP 130/84 | HR 103 | Ht 72.0 in | Wt 171.2 lb

## 2019-12-11 DIAGNOSIS — I480 Paroxysmal atrial fibrillation: Secondary | ICD-10-CM | POA: Diagnosis not present

## 2019-12-11 DIAGNOSIS — I5042 Chronic combined systolic (congestive) and diastolic (congestive) heart failure: Secondary | ICD-10-CM | POA: Diagnosis not present

## 2019-12-11 NOTE — Progress Notes (Signed)
Cardiology Office Note:    Date:  12/11/2019   ID:  Randy Murphy, DOB 03-Dec-1931, MRN [id number]  PCP:  Rodrigo Ran, MD  Cardiologist:  Donato Schultz, MD  Electrophysiologist:  None   Referring MD: Rodrigo Ran, MD     History of Present Illness:    Randy Murphy is a 86 y.o. male here for the follow-up of hypertension with underlying Parkinson's dementia frequent falls failure to thrive PAF systolic heart failure.  EF 35 to 40%.  Moderate aortic regurgitation.  Florinef was stopped because he was volume overloaded with shortness of breath and swelling in the past.  Not a candidate for anticoagulation.   He was hospitalized in March 2021-lethargy sleeping too much when EMS got there his blood pressure was high, likely out of anxiety.  Low-dose amlodipine was started in the hospital and he has been well since then.    Past Medical History:  Diagnosis Date  . Chronic combined systolic and diastolic CHF (congestive heart failure) (HCC)   . CKD (chronic kidney disease), stage III    a. stage II-III by labs, Cr 1.1-1.22 in 09/2017, 1.3-1.4 in 06/2018.  Marland Kitchen Dementia (HCC)   . Fatigue   . Frequent falls   . Hearing loss of both ears   . History of orthostatic hypotension   . Hyperlipidemia   . Hypersomnia, periodic 02/18/2014  . Hypertension   . Insomnia   . Junctional bradycardia   . Moderate aortic insufficiency   . Osteopenia   . PAF (paroxysmal atrial fibrillation) (HCC)    a. noted in hospital 06/2018, not on anticoag due to h/o frequent falling.  . Parkinson's disease    Possible early parkinsons disease  . Pulmonary hypertension (HCC)   . Sinus bradycardia   . Tricuspid regurgitation     Past Surgical History:  Procedure Laterality Date  . CATARACT EXTRACTION, BILATERAL    . NASAL SEPTUM SURGERY  1972    Current Medications: Current Meds  Medication Sig  . AMBULATORY NON FORMULARY MEDICATION Lift Chair DX: G20  . amLODipine (NORVASC) 5 MG tablet Take 1 tablet  (5 mg total) by mouth daily.  . carbidopa-levodopa (SINEMET CR) 50-200 MG tablet TAKE 1 TABLET BY MOUTH AT  BEDTIME  . Carbidopa-Levodopa ER (SINEMET CR) 25-100 MG tablet controlled release TAKE 2 TABLETS BY MOUTH 4  TIMES DAILY  . chlorhexidine (PERIDEX) 0.12 % solution 15 mLs by Mouth Rinse route 2 (two) times daily. Rinse and spit  . cholecalciferol (VITAMIN D3) 25 MCG (1000 UT) tablet Take 1,000 Units by mouth daily.  . clonazePAM (KLONOPIN) 0.5 MG tablet Take 1 tablet (0.5 mg total) by mouth at bedtime.  Marland Kitchen Co-Enzyme Q-10 100 MG CAPS Take 200 mg by mouth at bedtime.   . finasteride (PROSCAR) 5 MG tablet Take 5 mg by mouth daily.    Marland Kitchen ibandronate (BONIVA) 150 MG tablet Take 150 mg by mouth every 30 (thirty) days. Take in the morning with a full glass of water, on an empty stomach, and do not take anything else by mouth or lie down for the next 30 min.  . Multiple Vitamins-Minerals (PRESERVISION AREDS 2 PO) Take 1 capsule by mouth 2 (two) times daily.  Thereasa Solo Tartrate (NUPLAZID) 34 MG CAPS Take 1 capsule (34 mg total) by mouth daily.  . Probiotic Product (PROBIOTIC-10 PO) Take 1 tablet by mouth daily.     Allergies:   Patient has no known allergies.   Social History   Socioeconomic  History  . Marital status: Married    Spouse name: [name]  . Number of children: 1  . Years of education: 64  . Highest education level: Not on file  Occupational History  . Occupation: retired    Comment: Dealer - Building surveyor  Tobacco Use  . Smoking status: Never Smoker  . Smokeless tobacco: Never Used  Substance and Sexual Activity  . Alcohol use: No  . Drug use: No  . Sexual activity: Not on file  Other Topics Concern  . Not on file  Social History Narrative   Patient is married [name]) and lives at home with his wife.   Patient has one adult child.   Patient is retired.   Patient has a college education.   Patient is right-handed.   Patient drinks one cup of  coffee daily.   Social Determinants of Health   Financial Resource Strain:   . Difficulty of Paying Living Expenses:   Food Insecurity:   . Worried About Programme researcher, broadcasting/film/video in the Last Year:   . Barista in the Last Year:   Transportation Needs:   . Freight forwarder (Medical):   Marland Kitchen Lack of Transportation (Non-Medical):   Physical Activity:   . Days of Exercise per Week:   . Minutes of Exercise per Session:   Stress:   . Feeling of Stress :   Social Connections:   . Frequency of Communication with Friends and Family:   . Frequency of Social Gatherings with Friends and Family:   . Attends Religious Services:   . Active Member of Clubs or Organizations:   . Attends Banker Meetings:   Marland Kitchen Marital Status:      EKGs/Labs/Other Studies Reviewed:    The following studies were reviewed today:  ECHO 2021  1. Left ventricular ejection fraction, by estimation, is 35 to 40%. The  left ventricle has moderately decreased function. The left ventricle  demonstrates global hypokinesis. Left ventricular diastolic parameters are  consistent with Grade I diastolic  dysfunction (impaired relaxation). Elevated left ventricular end-diastolic  pressure.  2. Right ventricular systolic function is low normal. The right  ventricular size is not well visualized.  3. The mitral valve is abnormal. Trivial mitral valve regurgitation.  4. The aortic valve is tricuspid. Aortic valve regurgitation is trivial.  Mild aortic valve sclerosis is present, with no evidence of aortic valve  stenosis.   Comparison(s): No significant change from prior study. 07/10/2018: LVEF  35-40%.   EKG:  prior EKG on 11/13/2019 shows sinus rhythm left bundle branch block no changes.  Recent Labs: 11/13/2019: B Natriuretic Peptide 180.8 11/14/2019: BUN 35; Creatinine, Ser 1.02; Hemoglobin 12.8; Platelets 196; Potassium 3.8; Sodium 141  Recent Lipid Panel No results found for: CHOL, TRIG, HDL,  CHOLHDL, VLDL, LDLCALC, LDLDIRECT  Physical Exam:    VS:  BP 130/84   Pulse (!) 103   Ht 6' (1.829 m)   Wt 171 lb 3.2 oz (77.7 kg)   SpO2 92%   BMI 23.22 kg/m   Secondary pulse 60.  Wt Readings from Last 3 Encounters:  12/11/19 171 lb 3.2 oz (77.7 kg)  11/13/19 180 lb (81.6 kg)  08/14/18 176 lb 5.9 oz (80 kg)     GEN: In wheelchair well nourished, well developed in no acute distress HEENT: Normal NECK: No JVD; No carotid bruits LYMPHATICS: No lymphadenopathy CARDIAC: RRR, no murmurs, rubs, gallops RESPIRATORY:  Clear to auscultation without rales, wheezing or  rhonchi  ABDOMEN: Soft, non-tender, non-distended MUSCULOSKELETAL:  No edema; No deformity  SKIN: Warm and dry NEUROLOGIC:  Alert and oriented x 3 PSYCHIATRIC:  Normal affect   ASSESSMENT:    1. Chronic combined systolic and diastolic CHF (congestive heart failure) ([place])   2. PAF (paroxysmal atrial fibrillation) (HCC)    PLAN:    In order of problems listed above:  Chronic systolic heart failure -Overall continue to watch salt.  General not on beta-blockers because of history of significant bradycardia and not on ACE inhibitor or ARB because of softer blood pressures at times. -Okay to continue with Norvasc.  Paroxysmal atrial fibrillation -Currently sinus rhythm.  Not a candidate for anticoagulation.  Has not had any falls recently mostly because he is in a wheelchair.  Bradycardia -Stable.  Back mildly tachycardic today.  No need for pacemaker.  Orthostatic hypotension -Florinef no longer an option given his heart failure.  This caused volume overload requiring hospitalization in the past.  We could consider midodrine in the future.   Medication Adjustments/Labs and Tests Ordered: Current medicines are reviewed at length with the patient today.  Concerns regarding medicines are outlined above.  No orders of the defined types were placed in this encounter.  No orders of the defined types were placed in  this encounter.   Patient Instructions  Your physician recommends that you continue on your current medications as directed. Please refer to the Current Medication list given to you today.  Your physician wants you to follow-up in: [place] will receive a reminder letter in the mail two months in advance. If you don't receive a letter, please call our office to schedule the follow-up appointment.     Signed, Candee Furbish, MD  12/11/2019 1:51 PM    [place]

## 2019-12-11 NOTE — Patient Instructions (Addendum)
Your physician recommends that you continue on your current medications as directed. Please refer to the Current Medication list given to you today.  Your physician wants you to follow-up in: YEAR WITH DR SKAINS  You will receive a reminder letter in the mail two months in advance. If you don't receive a letter, please call our office to schedule the follow-up appointment.  

## 2019-12-25 ENCOUNTER — Telehealth: Payer: Self-pay | Admitting: Neurology

## 2019-12-25 NOTE — Telephone Encounter (Signed)
Does this form need to continually be filled out as the patient has been on nuplazid for quite some time.  I thought that the treatment form was for new patient start ups

## 2019-12-25 NOTE — Telephone Encounter (Addendum)
Refaxed form to the number in the previous phone message. This form has been faxed 5 times, but has not went through.  Hopefully the fax will go through with the new number they just sent over.   Spoke with someone at [facility] and she stated they have not received the form. Refaxed the form and will check back tomorrow to see if the form was received.

## 2019-12-25 NOTE — Telephone Encounter (Signed)
Dion Body from [facility] called and stated she received a patient assistance form for the pt's Nuplazid and she needs the treatment form she sent [place] this morning to be completed and faxed back to her at [phone] so she can get the prescription sent to the specialty pharmacy.

## 2019-12-27 ENCOUNTER — Telehealth: Payer: Self-pay

## 2019-12-27 NOTE — Telephone Encounter (Signed)
Contact [employer] to seek verification on the new form that was sent over. She states the first form was received and requested I complete the new form that was sent over.   Spoke with Lillia Abed at [facility] and she stated the patient does not qualify for the patient assistance due to the income and the patient having insurance. She stated her company sent over a Nuplazid Treatment Form that could help with the cost of the medicine. She stated if I could contact the patient and have him give me verbal consent I could put it at the of the form. She states the patient now has insurance and the medication may be affordable.    Informed her that I would fax the form over one it was complete. She voiced understanding.

## 2019-12-28 NOTE — Telephone Encounter (Signed)
Contacted [employer] and  To see if treatment form still needed to be completed. Spoke with [place] and informed her that the patients insurance has approved to pay for Nuplazid until 08/29/2020. She voiced understanding and requested I fax over a copy of the approval letter.   Letter faxed.   Waiting for a response from [place].

## 2020-01-15 NOTE — Telephone Encounter (Signed)
Contact [facility] to get clarification on the form that was faxed over last week. Spoke with rep at [facility] and she stated the patients insurance will not cover the rx at 100 percent. She states the patient was denied copay assistance because of his income. She stated the patient only has six tablets left. She stated the [employer] program will help pay with copay costs or send the patient the medication if they can not help the patient pay for the medication.   Informed her that the worm will be completed and faxed back. She voiced understanding.

## 2020-01-22 ENCOUNTER — Telehealth: Payer: Self-pay

## 2020-01-22 NOTE — Telephone Encounter (Signed)
Received call from patients spouse stating the patient is out of medication and she wanted to know if we have sent over forms. Advised patients wife to contact [facility] and if they have not received the forms then I will be more than glad to refax the forms. She voiced understanding.

## 2020-01-24 NOTE — Telephone Encounter (Signed)
Received [employer] approval letter for patient assistance program until 08/29/2020.   Patients spouse has been notified and voiced understanding.

## 2020-04-22 ENCOUNTER — Other Ambulatory Visit: Payer: Self-pay | Admitting: Neurology

## 2020-04-22 NOTE — Progress Notes (Signed)
Virtual Visit via Telephone Note The purpose of this virtual visit is to provide medical care while limiting exposure to the novel coronavirus.    Consent was obtained for phone visit:  Yes.   Answered questions that patient had about telehealth interaction:  Yes.   I discussed the limitations, risks, security and privacy concerns of performing an evaluation and management service by telephone. I also discussed with the patient that there may be a patient responsible charge related to this service. The patient expressed understanding and agreed to proceed.  Pt location: Home Physician Location: office Name of referring provider:  Rodrigo Ran, MD I connected with .[name] at patients initiation/request on 04/23/2020 at 10:15 AM EDT by telephone and verified that I am speaking with the correct person using two identifiers.  Pt MRN:  [id number] Pt DOB:  [birth year]  Participants:  [name]; Lacretia Leigh   History of Present Illness:  Patient seen today in follow-up.  He is on carbidopa/levodopa 25/100 CR, 2 tablets in the morning, 2 in the afternoon and 1 in the evening.  He is also on carbidopa/levodopa 50/200 at bedtime I got a call from one of his home nursing caregivers since our last visit about getting him an Mining engineer wheelchair.  I discussed with them that this would not be appropriate, nor paid for by Medicare, in a patient who has dementia.  Pt no longer walking.  He does stand some at the sink.  He is transferred with the hoyer lift.  He is on nuplazid for hallucinations - wife states that he may have hallucinations but the nuplazid made a difference in the way he reacts to them.    Dreams vivid but on klonopin.  There are 24 hour per day caregivers but they do have some trouble getting people in on the weekend.  Sleep pattern is off.  Sleeps late into the day.  Will nap some in the day and then will still fall asleep at the dinner table.     Current Outpatient  Medications:  .  AMBULATORY NON FORMULARY MEDICATION, Lift Chair DX: G20, Disp: 1 Device, Rfl: 0 .  buPROPion (WELLBUTRIN SR) 100 MG 12 hr tablet, Take 100 mg by mouth daily., Disp: , Rfl:  .  carbidopa-levodopa (SINEMET CR) 50-200 MG tablet, TAKE 1 TABLET BY MOUTH AT  BEDTIME, Disp: 90 tablet, Rfl: 3 .  Carbidopa-Levodopa ER (SINEMET CR) 25-100 MG tablet controlled release, TAKE 2 TABLETS BY MOUTH 4  TIMES DAILY, Disp: 720 tablet, Rfl: 1 .  chlorhexidine (PERIDEX) 0.12 % solution, 15 mLs by Mouth Rinse route 2 (two) times daily. Rinse and spit, Disp: , Rfl: 2 .  cholecalciferol (VITAMIN D3) 25 MCG (1000 UT) tablet, Take 1,000 Units by mouth daily., Disp: , Rfl:  .  clonazePAM (KLONOPIN) 0.5 MG tablet, Take 1 tablet (0.5 mg total) by mouth at bedtime., Disp: 30 tablet, Rfl: 1 .  Co-Enzyme Q-10 100 MG CAPS, Take 200 mg by mouth at bedtime. , Disp: , Rfl:  .  finasteride (PROSCAR) 5 MG tablet, Take 5 mg by mouth daily.  , Disp: , Rfl:  .  ibandronate (BONIVA) 150 MG tablet, Take 150 mg by mouth every 30 (thirty) days. Take in the morning with a full glass of water, on an empty stomach, and do not take anything else by mouth or lie down for the next 30 min., Disp: , Rfl:  .  Multiple Vitamin (MULTIVITAMIN WITH MINERALS) TABS tablet, Take  1 tablet by mouth daily., Disp: , Rfl:  .  Multiple Vitamins-Minerals (PRESERVISION AREDS 2 PO), Take 1 capsule by mouth 2 (two) times daily., Disp: , Rfl:  .  Pimavanserin Tartrate (NUPLAZID) 34 MG CAPS, Take 1 capsule (34 mg total) by mouth daily., Disp: 90 capsule, Rfl: 3 .  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  We discussed treatment options as well as prognostic indicators. Patient education was provided. -We discussed that it used to be thought that levodopa would increase risk of melanoma but now it is believed that Parkinsons itself likely increases risk of melanoma. heis to get regular skin checks. -Continue carbidopa/levodopa 25/100 CR to 2 tablets in the morning, 2 in the afternoon and 1 in the evening. -Continue carbidopa/levodopa 50/200 at bedtime.  -Patient really seems to be stable right now, so I did not change anything.  If things go downhill in the future, we probably could consider reducing his levodopa since he does not ambulate any longer. -using [employer].  No longer able to transfer.   2. Parkinson's disease dementia -Patient has 24/day care in the home, with round-the-clock caregivers.  -Continue Nuplazid, 34 mg daily.  That seems to be working well for him.  Told wife to call me if happened to need samples.  Only has 4 pills left and awaiting a shipment.  -discussed importance of regular daily schedule  Labs received from primary care dated June 28, 2019.  Sodium was 141, potassium 4.3, chloride 106, CO2 21, BUN 23, creatinine 1.0.  White blood cells 8.7, hemoglobin 15.6, hematocrit 48.0 and platelets 198.  TSH 2.02.  Total cholesterol 224, HDL 44, LDL 158  Follow Up Instructions:  would love to see patient in person but wife no longer able to get him here and no video capability  -I discussed the assessment and treatment plan with the patient. The patient was provided an opportunity to ask questions and all were answered. The patient agreed with the plan and demonstrated an understanding of the instructions.   The patient was advised to call back or seek an in-person evaluation if the symptoms worsen or if the condition fails to improve as anticipated.    Total Time spent in visit with the patient was:  13 min,  of which 100% of the time was spent in counseling .   Pt understands and agrees with the plan of care outlined.     Kerin Salen, DO

## 2020-04-23 ENCOUNTER — Telehealth: Payer: Medicare Other | Admitting: Neurology

## 2020-04-23 ENCOUNTER — Encounter: Payer: Self-pay | Admitting: Neurology

## 2020-04-23 ENCOUNTER — Telehealth (INDEPENDENT_AMBULATORY_CARE_PROVIDER_SITE_OTHER): Payer: Medicare Other | Admitting: Neurology

## 2020-04-23 ENCOUNTER — Other Ambulatory Visit: Payer: Self-pay

## 2020-04-23 VITALS — Ht 72.0 in | Wt 172.0 lb

## 2020-04-23 DIAGNOSIS — F0280 Dementia in other diseases classified elsewhere without behavioral disturbance: Secondary | ICD-10-CM

## 2020-04-23 DIAGNOSIS — G20 Parkinson's disease: Secondary | ICD-10-CM | POA: Diagnosis not present

## 2020-04-23 NOTE — Telephone Encounter (Signed)
Rx(s) sent to pharmacy electronically.  

## 2020-07-05 ENCOUNTER — Inpatient Hospital Stay (HOSPITAL_COMMUNITY)
Admission: EM | Admit: 2020-07-05 | Discharge: 2020-07-08 | DRG: 291 | Disposition: A | Discharge status: Home Health | Payer: Medicare Other | Attending: Internal Medicine | Admitting: Internal Medicine

## 2020-07-05 ENCOUNTER — Emergency Department (HOSPITAL_COMMUNITY): Payer: Medicare Other

## 2020-07-05 ENCOUNTER — Encounter (HOSPITAL_COMMUNITY): Payer: Self-pay | Admitting: Emergency Medicine

## 2020-07-05 ENCOUNTER — Other Ambulatory Visit: Payer: Self-pay

## 2020-07-05 DIAGNOSIS — I509 Heart failure, unspecified: Secondary | ICD-10-CM

## 2020-07-05 DIAGNOSIS — G20 Parkinson's disease: Secondary | ICD-10-CM | POA: Diagnosis present

## 2020-07-05 DIAGNOSIS — R296 Repeated falls: Secondary | ICD-10-CM | POA: Diagnosis present

## 2020-07-05 DIAGNOSIS — L89611 Pressure ulcer of right heel, stage 1: Secondary | ICD-10-CM | POA: Diagnosis present

## 2020-07-05 DIAGNOSIS — Z7401 Bed confinement status: Secondary | ICD-10-CM

## 2020-07-05 DIAGNOSIS — E785 Hyperlipidemia, unspecified: Secondary | ICD-10-CM | POA: Diagnosis present

## 2020-07-05 DIAGNOSIS — L8990 Pressure ulcer of unspecified site, unspecified stage: Secondary | ICD-10-CM | POA: Insufficient documentation

## 2020-07-05 DIAGNOSIS — Z23 Encounter for immunization: Secondary | ICD-10-CM | POA: Diagnosis present

## 2020-07-05 DIAGNOSIS — Z9841 Cataract extraction status, right eye: Secondary | ICD-10-CM | POA: Diagnosis not present

## 2020-07-05 DIAGNOSIS — G4700 Insomnia, unspecified: Secondary | ICD-10-CM | POA: Diagnosis present

## 2020-07-05 DIAGNOSIS — Z7189 Other specified counseling: Secondary | ICD-10-CM | POA: Diagnosis not present

## 2020-07-05 DIAGNOSIS — Z20822 Contact with and (suspected) exposure to covid-19: Secondary | ICD-10-CM | POA: Diagnosis present

## 2020-07-05 DIAGNOSIS — N183 Chronic kidney disease, stage 3 unspecified: Secondary | ICD-10-CM | POA: Diagnosis present

## 2020-07-05 DIAGNOSIS — Z66 Do not resuscitate: Secondary | ICD-10-CM | POA: Diagnosis present

## 2020-07-05 DIAGNOSIS — Z9842 Cataract extraction status, left eye: Secondary | ICD-10-CM | POA: Diagnosis not present

## 2020-07-05 DIAGNOSIS — F0390 Unspecified dementia without behavioral disturbance: Secondary | ICD-10-CM | POA: Diagnosis present

## 2020-07-05 DIAGNOSIS — Z79899 Other long term (current) drug therapy: Secondary | ICD-10-CM

## 2020-07-05 DIAGNOSIS — R778 Other specified abnormalities of plasma proteins: Secondary | ICD-10-CM | POA: Diagnosis present

## 2020-07-05 DIAGNOSIS — I480 Paroxysmal atrial fibrillation: Secondary | ICD-10-CM | POA: Diagnosis present

## 2020-07-05 DIAGNOSIS — I7121 Aneurysm of the ascending aorta, without rupture: Secondary | ICD-10-CM | POA: Diagnosis present

## 2020-07-05 DIAGNOSIS — I712 Thoracic aortic aneurysm, without rupture: Secondary | ICD-10-CM | POA: Diagnosis present

## 2020-07-05 DIAGNOSIS — Z7983 Long term (current) use of bisphosphonates: Secondary | ICD-10-CM | POA: Diagnosis not present

## 2020-07-05 DIAGNOSIS — N400 Enlarged prostate without lower urinary tract symptoms: Secondary | ICD-10-CM | POA: Diagnosis present

## 2020-07-05 DIAGNOSIS — H9193 Unspecified hearing loss, bilateral: Secondary | ICD-10-CM | POA: Diagnosis present

## 2020-07-05 DIAGNOSIS — I130 Hypertensive heart and chronic kidney disease with heart failure and stage 1 through stage 4 chronic kidney disease, or unspecified chronic kidney disease: Principal | ICD-10-CM | POA: Diagnosis present

## 2020-07-05 DIAGNOSIS — Z789 Other specified health status: Secondary | ICD-10-CM | POA: Diagnosis not present

## 2020-07-05 DIAGNOSIS — Z515 Encounter for palliative care: Secondary | ICD-10-CM | POA: Diagnosis not present

## 2020-07-05 DIAGNOSIS — L89892 Pressure ulcer of other site, stage 2: Secondary | ICD-10-CM | POA: Diagnosis present

## 2020-07-05 DIAGNOSIS — F0280 Dementia in other diseases classified elsewhere without behavioral disturbance: Secondary | ICD-10-CM | POA: Diagnosis present

## 2020-07-05 DIAGNOSIS — Z823 Family history of stroke: Secondary | ICD-10-CM

## 2020-07-05 DIAGNOSIS — I272 Pulmonary hypertension, unspecified: Secondary | ICD-10-CM | POA: Diagnosis present

## 2020-07-05 DIAGNOSIS — L89521 Pressure ulcer of left ankle, stage 1: Secondary | ICD-10-CM | POA: Diagnosis present

## 2020-07-05 DIAGNOSIS — I5022 Chronic systolic (congestive) heart failure: Secondary | ICD-10-CM | POA: Diagnosis not present

## 2020-07-05 DIAGNOSIS — I5043 Acute on chronic combined systolic (congestive) and diastolic (congestive) heart failure: Secondary | ICD-10-CM | POA: Diagnosis present

## 2020-07-05 LAB — CBC
HCT: 48.5 % (ref 39.0–52.0)
Hemoglobin: 15.6 g/dL (ref 13.0–17.0)
MCH: 31.9 pg (ref 26.0–34.0)
MCHC: 32.2 g/dL (ref 30.0–36.0)
MCV: 99.2 fL (ref 80.0–100.0)
Platelets: 224 10*3/uL (ref 150–400)
RBC: 4.89 MIL/uL (ref 4.22–5.81)
RDW: 12.8 % (ref 11.5–15.5)
WBC: 9.9 10*3/uL (ref 4.0–10.5)
nRBC: 0 % (ref 0.0–0.2)

## 2020-07-05 LAB — RESPIRATORY PANEL BY RT PCR (FLU A&B, COVID)
Influenza A by PCR: NEGATIVE
Influenza B by PCR: NEGATIVE
SARS Coronavirus 2 by RT PCR: NEGATIVE

## 2020-07-05 LAB — BASIC METABOLIC PANEL
Anion gap: 9 (ref 5–15)
BUN: 21 mg/dL (ref 8–23)
CO2: 27 mmol/L (ref 22–32)
Calcium: 9.1 mg/dL (ref 8.9–10.3)
Chloride: 103 mmol/L (ref 98–111)
Creatinine, Ser: 1.05 mg/dL (ref 0.61–1.24)
GFR, Estimated: >60 mL/min (ref >60)
Glucose, Bld: 115 mg/dL — ABNORMAL HIGH (ref 70–99)
Potassium: 4.2 mmol/L (ref 3.5–5.1)
Sodium: 139 mmol/L (ref 135–145)

## 2020-07-05 LAB — D-DIMER, QUANTITATIVE: D-Dimer, Quant: 1.3 ug/mL-FEU — ABNORMAL HIGH (ref 0.00–0.50)

## 2020-07-05 LAB — TROPONIN I (HIGH SENSITIVITY)
Troponin I (High Sensitivity): 39 ng/L — ABNORMAL HIGH (ref <18)
Troponin I (High Sensitivity): 46 ng/L — ABNORMAL HIGH (ref <18)

## 2020-07-05 MED ORDER — CARBIDOPA-LEVODOPA ER 25-100 MG PO TBCR
2.0000 | EXTENDED_RELEASE_TABLET | Freq: Two times a day (BID) | ORAL | Status: DC
  Administered 2020-07-06 – 2020-07-08 (×5): 2 via ORAL
  Filled 2020-07-05 (×7): qty 2

## 2020-07-05 MED ORDER — CLONAZEPAM 0.5 MG PO TABS
0.5000 mg | ORAL_TABLET | Freq: Every day | ORAL | Status: DC
  Administered 2020-07-06 – 2020-07-08 (×3): 0.5 mg via ORAL
  Filled 2020-07-05 (×3): qty 1

## 2020-07-05 MED ORDER — ONDANSETRON HCL 4 MG/2ML IJ SOLN: 4.0000 mg | Freq: Four times a day (QID) | INTRAMUSCULAR | Status: DC | PRN

## 2020-07-05 MED ORDER — BUPROPION HCL ER (SR) 100 MG PO TB12
100.0000 mg | ORAL_TABLET | Freq: Every day | ORAL | Status: DC
  Administered 2020-07-06 – 2020-07-08 (×3): 100 mg via ORAL
  Filled 2020-07-05 (×3): qty 1

## 2020-07-05 MED ORDER — FUROSEMIDE 10 MG/ML IJ SOLN
40.0000 mg | Freq: Once | INTRAMUSCULAR | Status: AC
  Administered 2020-07-05: 40 mg via INTRAVENOUS
  Filled 2020-07-05: qty 4

## 2020-07-05 MED ORDER — FUROSEMIDE 10 MG/ML IJ SOLN
40.0000 mg | Freq: Two times a day (BID) | INTRAMUSCULAR | Status: DC
  Administered 2020-07-06 (×2): 40 mg via INTRAVENOUS
  Filled 2020-07-05 (×2): qty 4

## 2020-07-05 MED ORDER — FINASTERIDE 5 MG PO TABS
5.0000 mg | ORAL_TABLET | Freq: Every day | ORAL | Status: DC
  Administered 2020-07-06 – 2020-07-08 (×3): 5 mg via ORAL
  Filled 2020-07-05 (×4): qty 1

## 2020-07-05 MED ORDER — ENOXAPARIN SODIUM 40 MG/0.4ML ~~LOC~~ SOLN
40.0000 mg | Freq: Every day | SUBCUTANEOUS | Status: DC
  Administered 2020-07-06 – 2020-07-07 (×2): 40 mg via SUBCUTANEOUS
  Filled 2020-07-05 (×2): qty 0.4

## 2020-07-05 MED ORDER — ACETAMINOPHEN 325 MG PO TABS: 650.0000 mg | ORAL_TABLET | ORAL | Status: DC | PRN

## 2020-07-05 MED ORDER — CARBIDOPA-LEVODOPA ER 25-100 MG PO TBCR
1.0000 | EXTENDED_RELEASE_TABLET | Freq: Every day | ORAL | Status: DC
  Administered 2020-07-06 – 2020-07-07 (×2): 1 via ORAL
  Filled 2020-07-05 (×3): qty 1

## 2020-07-05 MED ORDER — SODIUM CHLORIDE 0.9 % IV SOLN: 250.0000 mL | INTRAVENOUS | Status: DC | PRN

## 2020-07-05 MED ORDER — CARBIDOPA-LEVODOPA ER 50-200 MG PO TBCR
1.0000 | EXTENDED_RELEASE_TABLET | Freq: Every day | ORAL | Status: DC
  Administered 2020-07-06 – 2020-07-08 (×3): 1 via ORAL
  Filled 2020-07-05 (×4): qty 1

## 2020-07-05 MED ORDER — IOHEXOL 350 MG/ML SOLN
75.0000 mL | Freq: Once | INTRAVENOUS | Status: AC | PRN
  Administered 2020-07-05: 75 mL via INTRAVENOUS

## 2020-07-05 MED ORDER — CARBIDOPA-LEVODOPA ER 25-100 MG PO TBCR: 1.0000 | EXTENDED_RELEASE_TABLET | ORAL | Status: DC

## 2020-07-05 MED ORDER — FUROSEMIDE 10 MG/ML IJ SOLN: 40.0000 mg | Freq: Two times a day (BID) | INTRAMUSCULAR | Status: DC

## 2020-07-05 MED ORDER — SODIUM CHLORIDE 0.9% FLUSH: 3.0000 mL | INTRAVENOUS | Status: DC | PRN

## 2020-07-05 MED ORDER — SODIUM CHLORIDE 0.9% FLUSH
3.0000 mL | Freq: Two times a day (BID) | INTRAVENOUS | Status: DC
  Administered 2020-07-05 – 2020-07-08 (×6): 3 mL via INTRAVENOUS

## 2020-07-05 MED ORDER — PIMAVANSERIN TARTRATE 34 MG PO CAPS: 34.0000 mg | ORAL_CAPSULE | Freq: Every day | ORAL | Status: DC

## 2020-07-05 NOTE — H&P (Signed)
History and Physical    AKIF WELDY ZOX:[id number] DOB: 05/31/1932 DOA: 07/05/2020  PCP: Rodrigo Ran, MD   Patient coming from: Home   Chief Complaint: Breathing fast, clammy, elevated blood pressure   HPI: Randy Murphy is a 86 y.o. male with medical history significant for chronic combined systolic and diastolic CHF, chronic kidney disease stage III, Parkinson dementia, and paroxysmal atrial fibrillation not on anticoagulation due to falls, now presenting to emergency department after he was noted to be breathing fast, clammy, and had elevated blood pressure at home. Patient is accompanied by his wife who assists with the history. He seemed to be having slightly increased work of breathing over the past few days to a week before worsening significantly today. His blood pressure was also noted to be elevated to the 180/90 range at home which concerned his family. His wife explains that the typically try to avoid salty foods, but ordered some takeout last night that was very salty. Patient has not been complaining of any chest pain, has not been coughing much, and no fevers or chills have been noted.  ED Course: Upon arrival to the ED, patient is found to be afebrile, saturating mid to upper 80s on room air, tachypneic, and with stable blood pressure.  EKG features sinus rhythm with chronic LBBB.  Chest x-ray is notable for small bilateral pleural effusions and mild CHF findings.  CTA chest is negative for PE but notable for CHF, other findings that include ascending thoracic aortic dilatation.  Chemistry panel and CBC are essentially unremarkable.  High-sensitivity troponin is slightly elevated.  D-dimer was elevated to 1.30 which prompted the CTA.  COVID-19 PCR is negative.  Patient was given 40 mg IV Lasix in the ED.  Review of Systems:  All other systems reviewed and apart from HPI, are negative.  Past Medical History:  Diagnosis Date  . Chronic combined systolic and diastolic CHF  (congestive heart failure) (HCC)   . CKD (chronic kidney disease), stage III (HCC)    a. stage II-III by labs, Cr 1.1-1.22 in 09/2017, 1.3-1.4 in 06/2018.  Marland Kitchen Dementia (HCC)   . Fatigue   . Frequent falls   . Hearing loss of both ears   . History of orthostatic hypotension   . Hyperlipidemia   . Hypersomnia, periodic 02/18/2014  . Hypertension   . Insomnia   . Junctional bradycardia   . Moderate aortic insufficiency   . Osteopenia   . PAF (paroxysmal atrial fibrillation) (HCC)    a. noted in hospital 06/2018, not on anticoag due to h/o frequent falling.  . Parkinson's disease    Possible early parkinsons disease  . Pulmonary hypertension (HCC)   . Sinus bradycardia   . Tricuspid regurgitation     Past Surgical History:  Procedure Laterality Date  . CATARACT EXTRACTION, BILATERAL    . NASAL SEPTUM SURGERY  1972    Social History:   reports that he has never smoked. He has never used smokeless tobacco. He reports that he does not drink alcohol and does not use drugs.  No Known Allergies  Family History  Problem Relation Age of Onset  . Stroke Sister      Prior to Admission medications   Medication Sig Start Date End Date Taking? Authorizing Provider  buPROPion (WELLBUTRIN SR) 100 MG 12 hr tablet Take 100 mg by mouth daily. 04/02/20  Yes [provider]  carbidopa-levodopa (SINEMET CR) 50-200 MG tablet TAKE 1 TABLET BY MOUTH AT  BEDTIME 04/23/20  Yes Tat, Octaviano BattyRebecca S, DO  Carbidopa-Levodopa ER (SINEMET CR) 25-100 MG tablet controlled release Take 2 tabs in the morning; 2 tabs in the afternoon and 1 tab in the evening Patient taking differently: Take 1-2 tablets by mouth See admin instructions. Take 2 tabs in the morning; 2 tabs in the afternoon and 1 tab in the evening 04/23/20  Yes Tat, Rebecca S, DO  chlorhexidine (PERIDEX) 0.12 % solution 15 mLs by Mouth Rinse route at bedtime. Rinse and spit 06/20/18  Yes [provider]  cholecalciferol (VITAMIN D3) 25 MCG  (1000 UT) tablet Take 1,000 Units by mouth daily.   Yes [provider]  clonazePAM (KLONOPIN) 0.5 MG tablet Take 1 tablet (0.5 mg total) by mouth at bedtime. 07/14/18  Yes Berton Mountgbata, Sylvester I, MD  Co-Enzyme Q-10 100 MG CAPS Take 200 mg by mouth at bedtime.    Yes [provider]  finasteride (PROSCAR) 5 MG tablet Take 5 mg by mouth daily.     Yes [provider]  ibandronate (BONIVA) 150 MG tablet Take 150 mg by mouth every 30 (thirty) days. Take in the morning with a full glass of water, on an empty stomach, and do not take anything else by mouth or lie down for the next 30 min.   Yes [provider]  Multiple Vitamin (MULTIVITAMIN WITH MINERALS) TABS tablet Take 1 tablet by mouth daily.   Yes [provider]  Multiple Vitamins-Minerals (PRESERVISION AREDS 2 PO) Take 1 capsule by mouth 2 (two) times daily.   Yes [provider]  Pimavanserin Tartrate (NUPLAZID) 34 MG CAPS Take 1 capsule (34 mg total) by mouth daily. 10/17/19  Yes Tat, Octaviano Battyebecca S, DO  Probiotic Product (PROBIOTIC-10 PO) Take 1 tablet by mouth daily.   Yes [provider]  psyllium (METAMUCIL) 58.6 % powder Take 1 packet by mouth daily as needed (fiber).   Yes [provider]  AMBULATORY NON FORMULARY MEDICATION Lift Chair DX: G20 08/03/18   Vladimir Fasterat, Rebecca S, DO    Physical Exam: Vitals:   07/05/20 1845 07/05/20 2055 07/05/20 2226 07/05/20 2230  BP: (!) 145/88 (!) 146/77 (!) 134/108 (!) 145/78  Pulse: 86 85 81 78  Resp: (!) 21 (!) 28 20 15   Temp:      TempSrc:      SpO2: 92% 93% 94% 92%    Constitutional: NAD, calm  Eyes: PERTLA, lids and conjunctivae normal ENMT: Mucous membranes are moist. Posterior pharynx clear of any exudate or lesions.   Neck: normal, supple, no masses, no thyromegaly Respiratory: Mild tachypnea, no wheezing. No pallor or cyanosis.  Cardiovascular: S1 & S2 heard, regular rate and rhythm. Mild bilateral leg swelling. Abdomen: No  distension, no tenderness, soft. Bowel sounds active.  Musculoskeletal: no clubbing / cyanosis. No joint deformity upper and lower extremities.   Skin: no significant rashes, lesions, ulcers. Warm, dry, well-perfused. Neurologic: No gross facial asymmetry. Sensation intact. Moving all extremities.  Psychiatric: Awake, making eye contact. Answers his wife's questions with head nod/shakes. Calm.     Labs and Imaging on Admission: I have personally reviewed following labs and imaging studies  CBC: Recent Labs  Lab 07/05/20 1555  WBC 9.9  HGB 15.6  HCT 48.5  MCV 99.2  PLT 224   Basic Metabolic Panel: Recent Labs  Lab 07/05/20 1555  NA 139  K 4.2  CL 103  CO2 27  GLUCOSE 115*  BUN 21  CREATININE 1.05  CALCIUM 9.1   GFR: CrCl cannot  be calculated (Unknown ideal weight.). Liver Function Tests: No results for input(s): AST, ALT, ALKPHOS, BILITOT, PROT, ALBUMIN in the last 168 hours. No results for input(s): LIPASE, AMYLASE in the last 168 hours. No results for input(s): AMMONIA in the last 168 hours. Coagulation Profile: No results for input(s): INR, PROTIME in the last 168 hours. Cardiac Enzymes: No results for input(s): CKTOTAL, CKMB, CKMBINDEX, TROPONINI in the last 168 hours. BNP (last 3 results) No results for input(s): PROBNP in the last 8760 hours. HbA1C: No results for input(s): HGBA1C in the last 72 hours. CBG: No results for input(s): GLUCAP in the last 168 hours. Lipid Profile: No results for input(s): CHOL, HDL, LDLCALC, TRIG, CHOLHDL, LDLDIRECT in the last 72 hours. Thyroid Function Tests: No results for input(s): TSH, T4TOTAL, FREET4, T3FREE, THYROIDAB in the last 72 hours. Anemia Panel: No results for input(s): VITAMINB12, FOLATE, FERRITIN, TIBC, IRON, RETICCTPCT in the last 72 hours. Urine analysis:    Component Value Date/Time   COLORURINE YELLOW 11/13/2019 1010   APPEARANCEUR HAZY (A) 11/13/2019 1010   LABSPEC 1.021 11/13/2019 1010   PHURINE 5.0  11/13/2019 1010   GLUCOSEU NEGATIVE 11/13/2019 1010   HGBUR NEGATIVE 11/13/2019 1010   BILIRUBINUR NEGATIVE 11/13/2019 1010   KETONESUR 5 (A) 11/13/2019 1010   PROTEINUR NEGATIVE 11/13/2019 1010   NITRITE NEGATIVE 11/13/2019 1010   LEUKOCYTESUR NEGATIVE 11/13/2019 1010   Sepsis Labs: @LABRCNTIP (procalcitonin:4,lacticidven:4) ) Recent Results (from the past 240 hour(s))  Respiratory Panel by RT PCR (Flu A&B, Covid) - Nasopharyngeal Swab     Status: None   Collection Time: 07/05/20  6:00 PM   Specimen: Nasopharyngeal Swab  Result Value Ref Range Status   SARS Coronavirus 2 by RT PCR NEGATIVE NEGATIVE Final    Comment: (NOTE) SARS-CoV-2 target nucleic acids are NOT DETECTED.  The SARS-CoV-2 RNA is generally detectable in upper respiratoy specimens during the acute phase of infection. The lowest concentration of SARS-CoV-2 viral copies this assay can detect is 131 copies/mL. A negative result does not preclude SARS-Cov-2 infection and should not be used as the sole basis for treatment or other patient management decisions. A negative result may occur with  improper specimen collection/handling, submission of specimen other than nasopharyngeal swab, presence of viral mutation(s) within the areas targeted by this assay, and inadequate number of viral copies (<131 copies/mL). A negative result must be combined with clinical observations, patient history, and epidemiological information. The expected result is Negative.  Fact Sheet for Patients:  13/06/21  Fact Sheet for Healthcare Providers:  https://www.moore.com/  This test is no t yet approved or cleared by the https://www.young.biz/ FDA and  has been authorized for detection and/or diagnosis of SARS-CoV-2 by FDA under an Emergency Use Authorization (EUA). This EUA will remain  in effect (meaning this test can be used) for the duration of the COVID-19 declaration under Section  564(b)(1) of the Act, 21 U.S.C. section 360bbb-3(b)(1), unless the authorization is terminated or revoked sooner.     Influenza A by PCR NEGATIVE NEGATIVE Final   Influenza B by PCR NEGATIVE NEGATIVE Final    Comment: (NOTE) The Xpert Xpress SARS-CoV-2/FLU/RSV assay is intended as an aid in  the diagnosis of influenza from Nasopharyngeal swab specimens and  should not be used as a sole basis for treatment. Nasal washings and  aspirates are unacceptable for Xpert Xpress SARS-CoV-2/FLU/RSV  testing.  Fact Sheet for Patients: [place]  Fact Sheet for Healthcare Providers: https://www.moore.com/  This test is not yet approved or cleared by  the [employer] and  has been authorized for detection and/or diagnosis of SARS-CoV-2 by  FDA under an Emergency Use Authorization (EUA). This EUA will remain  in effect (meaning this test can be used) for the duration of the  Covid-19 declaration under Section 564(b)(1) of the Act, 21  U.S.C. section 360bbb-3(b)(1), unless the authorization is  terminated or revoked. Performed at [facility], [address] [place], [place] [address]      Radiological Exams on Admission: DG Chest 2 View  Result Date: 07/05/2020 CLINICAL DATA:  Dyspnea, tachypnea, hypertension, dementia EXAM: CHEST - 2 VIEW COMPARISON:  11/13/2019 chest radiograph. FINDINGS: Stable cardiomediastinal silhouette with mild cardiomegaly. No pneumothorax. Small bilateral pleural effusions, left greater than right. Mild diffuse prominence of the parahilar interstitial markings. Mild hazy bibasilar lung opacities, favor atelectasis. IMPRESSION: 1. Small bilateral pleural effusions, left greater than right. 2. Mild congestive heart failure. Electronically Signed   By: Delbert Phenix M.D.   On: 07/05/2020 17:00   CT Angio Chest PE W and/or Wo Contrast  Addendum Date: 07/05/2020   ADDENDUM REPORT: 07/05/2020 21:47 ADDENDUM:  Patient history: Tachypnic with clammy complexion, elevated D-Dimer Additional impression and follow-up recommendation: Ascending thoracic aortic dilatation to 4.2 cm. Recommend annual imaging followup by CTA or MRA. This recommendation follows 2010 ACCF/AHA/AATS/ACR/ASA/SCA/SCAI/SIR/STS/SVM Guidelines for the Diagnosis and Management of Patients with Thoracic Aortic Disease. Circulation. 2010; 121: G836-O294. Aortic aneurysm NOS (ICD10-I71.9) Electronically Signed   By: Kreg Shropshire M.D.   On: 07/05/2020 21:47   Result Date: 07/05/2020 CLINICAL DATA:  CT neck, clammy complexion, elevated D-dimer EXAM: CT ANGIOGRAPHY CHEST WITH CONTRAST TECHNIQUE: Multidetector CT imaging of the chest was performed using the standard protocol during bolus administration of intravenous contrast. Multiplanar CT image reconstructions and MIPs were obtained to evaluate the vascular anatomy. CONTRAST:  70mL OMNIPAQUE IOHEXOL 350 MG/ML SOLN COMPARISON:  Radiograph 07/05/2020, CT abdomen pelvis 09/08/2018 FINDINGS: Cardiovascular: Satisfactory opacification the pulmonary arteries to the segmental level. No pulmonary artery filling defects are identified. Central pulmonary arteries are borderline enlarged. Mild cardiomegaly some right heart enlargement. Coronary artery calcifications are present. No pericardial effusion. Fluid in the pericardial recesses is likely within physiologic normal. The aortic root is suboptimally assessed given cardiac pulsation artifact. Ascending aorta measures up to 4.2 cm in size returning to a more normal caliber 3 cm by the distal arch. Atherosclerotic plaque within the aorta. No acute luminal abnormality of the imaged aorta. No periaortic stranding or hemorrhage. Normal 3 vessel branching of the aortic arch. Proximal great vessels are tortuous but free of acute abnormality. No major venous abnormalities. Mediastinum/Nodes: No mediastinal fluid or gas. Normal thyroid gland and thoracic inlet. No acute  abnormality of the trachea or esophagus. Scattered low-attenuation mediastinal hilar nodes present largest including a 13 mm precarinal lymph node (5/57) and a 13 mm AP window lymph node (5/56). These are fairly low attenuation may be reactive or edematous. Lungs/Pleura: Lung volumes are low with atelectasis likely accentuated by imaging during exhalation. There is significant redistribution of the pulmonary vascularity with bronchial cuffing, fissural and septal thickening, and perihilar and dependent predominant hazy ground-glass opacity most suggestive of pulmonary edema with some small to moderate bilateral effusions and passive atelectatic changes. Some more bandlike opacities towards the lung bases could reflect additional subsegmental atelectasis or scarring. No pneumothorax. Upper Abdomen: Redemonstration of a cystic lesion measuring up to 14 mm in the dome of the liver. Some slightly increased attenuation may be related to the adjacent  dense contrast bolus. (5/115). No other liver abnormalities are seen. Gallbladder mildly distended with layering partially calcified gallstones and some gradient attenuation, possibly biliary sludge. No pericholecystic fluid or inflammation or biliary ductal dilatation is seen. Mild symmetric bilateral perinephric stranding, a nonspecific finding which may correlate with advanced age or decreased renal function the correlate for urinary symptoms. Musculoskeletal: Multilevel degenerative changes are present in the imaged portions of the spine. Levocurvature in exaggerated thoracic kyphosis with some associated chest wall deformity. Additional degenerative changes in the shoulders. No acute or worrisome osseous abnormality. Body wall edema most pronounced over the right flank and chest wall. No other acute or focal soft tissue abnormality. Review of the MIP images confirms the above findings. IMPRESSION: 1. No evidence of acute pulmonary artery embolism. 2. Features of  congestive heart failure with cardiomegaly, redistribution of the pulmonary vascularity, pulmonary edema, and small to moderate bilateral effusions with adjacent passive atelectatic changes. 3. Right heart enlargement and central pulmonary arterial enlargement may reflect some chronic pulmonary artery hypertension/right heart dysfunction. 4. Scattered low-attenuation borderline enlarged mediastinal and hilar nodes, may be reactive or edematous. 5. Cholelithiasis and likely biliary sludge without CT evidence of acute cholecystitis. 6. Mild symmetric bilateral perinephric stranding, a nonspecific finding which may correlate with advanced age or decreased renal function the correlate for urinary symptoms. 7. Aortic Atherosclerosis (ICD10-I70.0). 8. Coronary artery calcifications are present. Please note that the presence of coronary artery calcium documents the presence of coronary artery disease, the severity of this disease and any potential stenosis cannot be assessed on this non-gated CT examination. Electronically Signed: By: Kreg Shropshire M.D. On: 07/05/2020 21:09    EKG: Independently reviewed. Sinus rhythm, chronic LBBB.   Assessment/Plan   1. Acute on chronic combined systolic & diastolic CHF; acute hypoxic respiratory failure  - Presents with increased WOB, tachypnea, and hypertension at home and is found to be saturating mid 80s on room air with CHF-findings but no PE on CTA chest  - Echo from March 2021 with EF 35-40%, global hypokinesis, and grade 1 diastolic dysfunction  - Likely precipitated by salty meal last night  - Given Lasix 40 mg IV in ED  - Continue diuresis with Lasix 40 mg IV q12h, monitor weight and I/Os, monitor renal function and electrolytes    2. Elevated troponin  - HS troponin 39, then 46 in ED  - Patient has not complained of any chest pain  - Chronic LBBB on EKG  - Acute CHF likely precipitated by dietary indiscretions rather than acute MI, a third troponin is pending  but do not anticipate any further workup unless it is significantly higher   3. PAF  - In sinus rhythm on admission  - Has not been on anticoagulant due to fall risk    4. Parkinson dementia  - Continue Sinemet, Nuplazid    5. Ascending thoracic aortic dilatation  - Noted incidentally on CTA chest in ED  - Annual follow-up imaging was recommended     DVT prophylaxis: Lovenox  Code Status: DNR, confirmed on admission  Family Communication: Wife updated at bedside  Disposition Plan:  Patient is from: home  Anticipated d/c is to: TBD Anticipated d/c date is: 07/08/20 Patient currently: Has new supplemental O2 requirement, pending improvement with diuresis  Consults called: None  Admission status: Inpatient     Briscoe Deutscher, MD [facility]  07/05/2020, 11:06 PM

## 2020-07-05 NOTE — ED Provider Notes (Signed)
[facility] Provider Note   CSN: [id number] Arrival date & time: 07/05/20  1544     History No chief complaint on file.   Randy Murphy is a 86 y.o. male.  HPI Patient presents after reported hypertension.  Reportedly is tachypneic and clammy.  Upon arrival initial sats of 89%.  Does have dementia can not provide much history.  Patient states he does not feel great but cannot really elaborate much more on it.    Past Medical History:  Diagnosis Date  . Chronic combined systolic and diastolic CHF (congestive heart failure) (HCC)   . CKD (chronic kidney disease), stage III (HCC)    a. stage II-III by labs, Cr 1.1-1.22 in 09/2017, 1.3-1.4 in 06/2018.  Marland Kitchen. Dementia (HCC)   . Fatigue   . Frequent falls   . Hearing loss of both ears   . History of orthostatic hypotension   . Hyperlipidemia   . Hypersomnia, periodic 02/18/2014  . Hypertension   . Insomnia   . Junctional bradycardia   . Moderate aortic insufficiency   . Osteopenia   . PAF (paroxysmal atrial fibrillation) (HCC)    a. noted in hospital 06/2018, not on anticoag due to h/o frequent falling.  . Parkinson's disease    Possible early parkinsons disease  . Pulmonary hypertension (HCC)   . Sinus bradycardia   . Tricuspid regurgitation     Patient Active Problem List   Diagnosis Date Noted  . Hypertensive urgency 11/13/2019  . Acute encephalopathy 11/13/2019  . Dementia (HCC) 11/13/2019  . Congestive heart failure (CHF) (HCC) 07/09/2018  . Hypersomnia, periodic 02/18/2014  . Parkinson disease (HCC) 02/18/2014  . H/O viral encephalitis 02/18/2014  . Hearing loss of both ears   . Orthostatic hypotension 05/27/2011    Past Surgical History:  Procedure Laterality Date  . CATARACT EXTRACTION, BILATERAL    . NASAL SEPTUM SURGERY  1972       Family History  Problem Relation Age of Onset  . Stroke Sister     Social History   Tobacco Use  . Smoking status: Never Smoker   . Smokeless tobacco: Never Used  Vaping Use  . Vaping Use: Never used  Substance Use Topics  . Alcohol use: No  . Drug use: No    Home Medications Prior to Admission medications   Medication Sig Start Date End Date Taking? Authorizing Provider  buPROPion (WELLBUTRIN SR) 100 MG 12 hr tablet Take 100 mg by mouth daily. 04/02/20  Yes [provider]  carbidopa-levodopa (SINEMET CR) 50-200 MG tablet TAKE 1 TABLET BY MOUTH AT  BEDTIME 04/23/20  Yes Tat, Rebecca S, DO  Carbidopa-Levodopa ER (SINEMET CR) 25-100 MG tablet controlled release Take 2 tabs in the morning; 2 tabs in the afternoon and 1 tab in the evening Patient taking differently: Take 1-2 tablets by mouth See admin instructions. Take 2 tabs in the morning; 2 tabs in the afternoon and 1 tab in the evening 04/23/20  Yes Tat, Rebecca S, DO  chlorhexidine (PERIDEX) 0.12 % solution 15 mLs by Mouth Rinse route at bedtime. Rinse and spit 06/20/18  Yes [provider]  cholecalciferol (VITAMIN D3) 25 MCG (1000 UT) tablet Take 1,000 Units by mouth daily.   Yes [provider]  clonazePAM (KLONOPIN) 0.5 MG tablet Take 1 tablet (0.5 mg total) by mouth at bedtime. 07/14/18  Yes Berton Mountgbata, Sylvester I, MD  Co-Enzyme Q-10 100 MG CAPS Take 200 mg by mouth at bedtime.  Yes [provider]  finasteride (PROSCAR) 5 MG tablet Take 5 mg by mouth daily.     Yes [provider]  ibandronate (BONIVA) 150 MG tablet Take 150 mg by mouth every 30 (thirty) days. Take in the morning with a full glass of water, on an empty stomach, and do not take anything else by mouth or lie down for the next 30 min.   Yes [provider]  Multiple Vitamin (MULTIVITAMIN WITH MINERALS) TABS tablet Take 1 tablet by mouth daily.   Yes [provider]  Multiple Vitamins-Minerals (PRESERVISION AREDS 2 PO) Take 1 capsule by mouth 2 (two) times daily.   Yes [provider]  Pimavanserin Tartrate (NUPLAZID) 34 MG CAPS  Take 1 capsule (34 mg total) by mouth daily. 10/17/19  Yes Tat, Octaviano Batty, DO  Probiotic Product (PROBIOTIC-10 PO) Take 1 tablet by mouth daily.   Yes [provider]  psyllium (METAMUCIL) 58.6 % powder Take 1 packet by mouth daily as needed (fiber).   Yes [provider]  AMBULATORY NON FORMULARY MEDICATION Lift Chair DX: G20 08/03/18   TatOctaviano Batty, DO    Allergies    Patient has no known allergies.  Review of Systems   Review of Systems  Unable to perform ROS: Mental status change  Respiratory: Positive for shortness of breath.     Physical Exam Updated Vital Signs BP (!) 134/108 (BP Location: Left Arm)   Pulse 81   Temp 98.1 F (36.7 C) (Oral)   Resp 20   SpO2 94%   Physical Exam Vitals and nursing note reviewed.  HENT:     Head: Normocephalic.  Eyes:     Pupils: Pupils are equal, round, and reactive to light.  Cardiovascular:     Rate and Rhythm: Tachycardia present.  Abdominal:     Tenderness: There is no abdominal tenderness.  Musculoskeletal:     Cervical back: Neck supple.     Comments: Pitting edema on left lower extremity  Skin:    General: Skin is warm.     Capillary Refill: Capillary refill takes less than 2 seconds.  Neurological:     Mental Status: He is alert.     Comments:   Awake and pleasant but some confusion.     ED Results / Procedures / Treatments   Labs (all labs ordered are listed, but only abnormal results are displayed) Labs Reviewed  BASIC METABOLIC PANEL - Abnormal; Notable for the following components:      Result Value   Glucose, Bld 115 (*)    All other components within normal limits  D-DIMER, QUANTITATIVE (NOT AT [facility]) - Abnormal; Notable for the following components:   D-Dimer, Quant 1.30 (*)    All other components within normal limits  TROPONIN I (HIGH SENSITIVITY) - Abnormal; Notable for the following components:   Troponin I (High Sensitivity) 39 (*)    All other components within normal limits    TROPONIN I (HIGH SENSITIVITY) - Abnormal; Notable for the following components:   Troponin I (High Sensitivity) 46 (*)    All other components within normal limits  RESPIRATORY PANEL BY RT PCR (FLU A&B, COVID)  CBC    EKG EKG Interpretation  Date/Time:  Saturday July 05 2020 15:52:10 EDT Ventricular Rate:  84 PR Interval:  204 QRS Duration: 168 QT Interval:  432 QTC Calculation: 510 R Axis:   -45 Text Interpretation: Normal sinus rhythm with sinus arrhythmia Left axis deviation Left bundle branch block  Abnormal ECG Confirmed by Benjiman Core [phone]) on 07/05/2020 10:32:02 PM   Radiology DG Chest 2 View  Result Date: 07/05/2020 CLINICAL DATA:  Dyspnea, tachypnea, hypertension, dementia EXAM: CHEST - 2 VIEW COMPARISON:  11/13/2019 chest radiograph. FINDINGS: Stable cardiomediastinal silhouette with mild cardiomegaly. No pneumothorax. Small bilateral pleural effusions, left greater than right. Mild diffuse prominence of the parahilar interstitial markings. Mild hazy bibasilar lung opacities, favor atelectasis. IMPRESSION: 1. Small bilateral pleural effusions, left greater than right. 2. Mild congestive heart failure. Electronically Signed   By: Delbert Phenix M.D.   On: 07/05/2020 17:00   CT Angio Chest PE W and/or Wo Contrast  Addendum Date: 07/05/2020   ADDENDUM REPORT: 07/05/2020 21:47 ADDENDUM: Patient history: Tachypnic with clammy complexion, elevated D-Dimer Additional impression and follow-up recommendation: Ascending thoracic aortic dilatation to 4.2 cm. Recommend annual imaging followup by CTA or MRA. This recommendation follows 2010 ACCF/AHA/AATS/ACR/ASA/SCA/SCAI/SIR/STS/SVM Guidelines for the Diagnosis and Management of Patients with Thoracic Aortic Disease. Circulation. 2010; 121: J673-A193. Aortic aneurysm NOS (ICD10-I71.9) Electronically Signed   By: Kreg Shropshire M.D.   On: 07/05/2020 21:47   Result Date: 07/05/2020 CLINICAL DATA:  CT neck, clammy complexion, elevated  D-dimer EXAM: CT ANGIOGRAPHY CHEST WITH CONTRAST TECHNIQUE: Multidetector CT imaging of the chest was performed using the standard protocol during bolus administration of intravenous contrast. Multiplanar CT image reconstructions and MIPs were obtained to evaluate the vascular anatomy. CONTRAST:  75mL OMNIPAQUE IOHEXOL 350 MG/ML SOLN COMPARISON:  Radiograph 07/05/2020, CT abdomen pelvis 09/08/2018 FINDINGS: Cardiovascular: Satisfactory opacification the pulmonary arteries to the segmental level. No pulmonary artery filling defects are identified. Central pulmonary arteries are borderline enlarged. Mild cardiomegaly some right heart enlargement. Coronary artery calcifications are present. No pericardial effusion. Fluid in the pericardial recesses is likely within physiologic normal. The aortic root is suboptimally assessed given cardiac pulsation artifact. Ascending aorta measures up to 4.2 cm in size returning to a more normal caliber 3 cm by the distal arch. Atherosclerotic plaque within the aorta. No acute luminal abnormality of the imaged aorta. No periaortic stranding or hemorrhage. Normal 3 vessel branching of the aortic arch. Proximal great vessels are tortuous but free of acute abnormality. No major venous abnormalities. Mediastinum/Nodes: No mediastinal fluid or gas. Normal thyroid gland and thoracic inlet. No acute abnormality of the trachea or esophagus. Scattered low-attenuation mediastinal hilar nodes present largest including a 13 mm precarinal lymph node (5/57) and a 13 mm AP window lymph node (5/56). These are fairly low attenuation may be reactive or edematous. Lungs/Pleura: Lung volumes are low with atelectasis likely accentuated by imaging during exhalation. There is significant redistribution of the pulmonary vascularity with bronchial cuffing, fissural and septal thickening, and perihilar and dependent predominant hazy ground-glass opacity most suggestive of pulmonary edema with some small to  moderate bilateral effusions and passive atelectatic changes. Some more bandlike opacities towards the lung bases could reflect additional subsegmental atelectasis or scarring. No pneumothorax. Upper Abdomen: Redemonstration of a cystic lesion measuring up to 14 mm in the dome of the liver. Some slightly increased attenuation may be related to the adjacent dense contrast bolus. (5/115). No other liver abnormalities are seen. Gallbladder mildly distended with layering partially calcified gallstones and some gradient attenuation, possibly biliary sludge. No pericholecystic fluid or inflammation or biliary ductal dilatation is seen. Mild symmetric bilateral perinephric stranding, a nonspecific finding which may correlate with advanced age or decreased renal function the correlate for urinary symptoms. Musculoskeletal: Multilevel degenerative changes are present in the imaged portions of the spine.  Levocurvature in exaggerated thoracic kyphosis with some associated chest wall deformity. Additional degenerative changes in the shoulders. No acute or worrisome osseous abnormality. Body wall edema most pronounced over the right flank and chest wall. No other acute or focal soft tissue abnormality. Review of the MIP images confirms the above findings. IMPRESSION: 1. No evidence of acute pulmonary artery embolism. 2. Features of congestive heart failure with cardiomegaly, redistribution of the pulmonary vascularity, pulmonary edema, and small to moderate bilateral effusions with adjacent passive atelectatic changes. 3. Right heart enlargement and central pulmonary arterial enlargement may reflect some chronic pulmonary artery hypertension/right heart dysfunction. 4. Scattered low-attenuation borderline enlarged mediastinal and hilar nodes, may be reactive or edematous. 5. Cholelithiasis and likely biliary sludge without CT evidence of acute cholecystitis. 6. Mild symmetric bilateral perinephric stranding, a nonspecific  finding which may correlate with advanced age or decreased renal function the correlate for urinary symptoms. 7. Aortic Atherosclerosis (ICD10-I70.0). 8. Coronary artery calcifications are present. Please note that the presence of coronary artery calcium documents the presence of coronary artery disease, the severity of this disease and any potential stenosis cannot be assessed on this non-gated CT examination. Electronically Signed: By: Kreg Shropshire M.D. On: 07/05/2020 21:09    Procedures Procedures (including critical care time)  Medications Ordered in ED Medications  iohexol (OMNIPAQUE) 350 MG/ML injection 75 mL (75 mLs Intravenous Contrast Given 07/05/20 2051)    ED Course  I have reviewed the triage vital signs and the nursing notes.  Pertinent labs & imaging results that were available during my care of the patient were reviewed by me and considered in my medical decision making (see chart for details).    MDM Rules/Calculators/A&P                          Patient brought in for shortness of breath.  Reportedly may have had a tachycardia to.  Patient states he was feeling fine.  No chest pain now.  Does have rales at the bases.  Worsening edema on left lower extremity compared to right.  D-dimer elevated.  CT scan done does not show blood clot but does show pulmonary edema.  Initially had been hypoxic.  Will admit to hospitalist. Final Clinical Impression(s) / ED Diagnoses Final diagnoses:  Congestive heart failure, unspecified HF chronicity, unspecified heart failure type [facility])    Rx / DC Orders ED Discharge Orders    None       Benjiman Core, MD 07/05/20 2234

## 2020-07-05 NOTE — ED Triage Notes (Signed)
Pt to triage via [facility] from home.  Family called 911 due to hypertension.  Per EMS pt tachypnic and clammy.  Hx of dementia.  Non-ambulatory- stroke history.  Pt denies complaint.

## 2020-07-06 DIAGNOSIS — I509 Heart failure, unspecified: Secondary | ICD-10-CM

## 2020-07-06 DIAGNOSIS — F0280 Dementia in other diseases classified elsewhere without behavioral disturbance: Secondary | ICD-10-CM

## 2020-07-06 DIAGNOSIS — Z66 Do not resuscitate: Secondary | ICD-10-CM

## 2020-07-06 DIAGNOSIS — Z515 Encounter for palliative care: Secondary | ICD-10-CM

## 2020-07-06 DIAGNOSIS — Z789 Other specified health status: Secondary | ICD-10-CM

## 2020-07-06 DIAGNOSIS — Z7189 Other specified counseling: Secondary | ICD-10-CM

## 2020-07-06 DIAGNOSIS — I5043 Acute on chronic combined systolic (congestive) and diastolic (congestive) heart failure: Secondary | ICD-10-CM

## 2020-07-06 DIAGNOSIS — G20 Parkinson's disease: Secondary | ICD-10-CM

## 2020-07-06 LAB — BASIC METABOLIC PANEL
Anion gap: 11 (ref 5–15)
BUN: 19 mg/dL (ref 8–23)
CO2: 26 mmol/L (ref 22–32)
Calcium: 8.7 mg/dL — ABNORMAL LOW (ref 8.9–10.3)
Chloride: 102 mmol/L (ref 98–111)
Creatinine, Ser: 1.11 mg/dL (ref 0.61–1.24)
GFR, Estimated: >60 mL/min (ref >60)
Glucose, Bld: 101 mg/dL — ABNORMAL HIGH (ref 70–99)
Potassium: 3.8 mmol/L (ref 3.5–5.1)
Sodium: 139 mmol/L (ref 135–145)

## 2020-07-06 LAB — CBC
HCT: 45.4 % (ref 39.0–52.0)
Hemoglobin: 14.4 g/dL (ref 13.0–17.0)
MCH: 31 pg (ref 26.0–34.0)
MCHC: 31.7 g/dL (ref 30.0–36.0)
MCV: 97.8 fL (ref 80.0–100.0)
Platelets: 221 10*3/uL (ref 150–400)
RBC: 4.64 MIL/uL (ref 4.22–5.81)
RDW: 13.1 % (ref 11.5–15.5)
WBC: 7.7 10*3/uL (ref 4.0–10.5)
nRBC: 0 % (ref 0.0–0.2)

## 2020-07-06 LAB — BRAIN NATRIURETIC PEPTIDE: B Natriuretic Peptide: 1309.9 pg/mL — ABNORMAL HIGH (ref 0.0–100.0)

## 2020-07-06 LAB — TROPONIN I (HIGH SENSITIVITY): Troponin I (High Sensitivity): 52 ng/L — ABNORMAL HIGH (ref <18)

## 2020-07-06 MED ORDER — LIVING BETTER WITH HEART FAILURE BOOK: Freq: Once | Status: AC

## 2020-07-06 MED ORDER — INFLUENZA VAC A&B SA ADJ QUAD 0.5 ML IM PRSY
0.5000 mL | PREFILLED_SYRINGE | INTRAMUSCULAR | Status: AC
  Administered 2020-07-07: 0.5 mL via INTRAMUSCULAR
  Filled 2020-07-06: qty 0.5

## 2020-07-06 NOTE — Progress Notes (Signed)
[facility])  Received referral for possible home with hospice for Mr. Wicke at discharge. Wife may also be interested in Palliative care. When this liaison reached out to wife she was unsure and would like for [place] to reach out again to go over our hospice vs palliative services.   Thank you for this referral. Please feel free to reach out at any time for further questions.  Yolande Jolly, BSN, [facility] (in [place]) [phone]

## 2020-07-06 NOTE — Progress Notes (Signed)
PROGRESS NOTE    Randy Murphy  ACZ:[id number] DOB: 01/28/1932 DOA: 07/05/2020 PCP: Rodrigo Ran, MD    Brief Narrative: 86 year old male with history of chronic combined systolic and diastolic CHF, CKD stage III, Parkinson's dementia/hearing loss/frequent falls/insomnia, paroxysmal A. Fib- not on anticoagulation due to falls, , moderate aortic insufficiency, pulmonary hypertension, sinus bradycardia, hypertension, hyperlipidemia, history of orthostatic hypotension presented with complaint of fast breathing, elevated blood pressure, increased work of breathing over the past few days to week.  As per report patient having slightly increased work of breathing over the past few days to a week before worsening significantly today. His blood pressure was also noted to be elevated to the 180/90 range at home which concerned his family. His wife explains that the typically try to avoid salty foods, but ordered some takeout last night that was very salty. Patient has not been complaining of any chest pain, has not been coughing much, and no fevers or chills have been noted.  In the ED, afebrile, saturating mid to upper 80s on room air, tachypneic, and with stable blood pressure.  EKG nsr/chronic LBBB.  Cxr-small bilateral pleural effusions and mild CHF findings.    Troponin slightly positive but flat 39, 46, 52, D-dimer 1.3, subsequently underwent CTA chest:negative for PE but notable for CHF,ascending thoracic aortic dilatation. COVID-19 PCR is negative.  Patient was given 40 mg IV Lasix in the ED and was subsequently admitted.  Subjective: Seen this morning.  Was sleepy but did wake up did not interact, does not follow command, not in acute distress, on 2 L nasal cannula. Wife had just left for breakfast.  Discussed with the nursing staff. Mental status is at baseline with dementia  Assessment & Plan:  Acute on chronic combined systolic and diastolic CHF: Suspected from dietary indiscretion/salty  meals, no PND in the CT chest, echo from 2021 March EF 35 to 40%, global hypokinesia and grade 1 DD.  Checked proBNP- elevated at 1309. We will continue Lasix 40 mg twice daily with close monitoring of intake cardiac, renal function.  He seems to be responding well.Continue supplemental oxygen ambulate as tolerated  Elevated troponin, flat 39, 46, 52,, no acute ischemic changes on EKG no chest pain suspected type II demand mismatch in the setting of acute CHF.  CKD stage II, BUN/creatinine seems stable at 1.1. monitor  Parkinson's dementia/hearing loss/frequent falls/insomnia: Continue his home Sinement, Klonopin and Wellbutrin.  Sleepy but alert awake does not follow command, demented.  This is his baseline as per his wife per RN.  Paroxysmal A. Fib- not on anticoagulation due to falls, in NSR. Monitor.  Thoracic ascending aortic aneurysm/Moderate aortic insufficiency/TR/Pulmonary hypertension: Incidentally noted thoracic aortic dilation, annual follow-up with imaging, PCP follow-up.  Hypertension/ history of orthostatic hypotension: Blood pressure stable.  Not on antihypertensives  BPH continue Proscar.  Goals of care: Is DNR.  Will request palliative care consultation for ongoing discussion given his overall age, complex medical comorbidities especially w/ respiratory failure and CHF  Nutrition: Diet Order            Diet Heart Room service appropriate? Yes; Fluid consistency: Thin; Fluid restriction: 1500 mL Fluid  Diet effective now                There is no height or weight on file to calculate BMI.   DVT prophylaxis: enoxaparin (LOVENOX) injection 40 mg Start: 07/06/20 1000 Code Status:   Code Status: DNR  Family Communication: plan of care discussed with nursing  staff at bedside.  Patient's wife was at bedside and ambulate for breakfast.    Status is: Inpatient  Remains inpatient appropriate because:IV treatments appropriate due to intensity of illness or inability to  take PO and Inpatient level of care appropriate due to severity of illness   Dispo: The patient is from: Home              Anticipated d/c is to: Home              Anticipated d/c date is: 2 days              Patient currently is not medically stable to d/c.   Consultants:see note  Procedures:see note  Culture/Microbiology    Component Value Date/Time   SDES  09/08/2018 1403    BLOOD LEFT ANTECUBITAL Performed at [facility], [address] [place], [place] [address]    SPECREQUEST  09/08/2018 1403    BOTTLES DRAWN AEROBIC AND ANAEROBIC Blood Culture results may not be optimal due to an excessive volume of blood received in culture bottles Performed at [facility], [address] [place], [place] [address]    CULT  09/08/2018 1403    NO GROWTH 5 DAYS Performed at [facility], [address] [place], [place] [address]    REPTSTATUS 09/13/2018 FINAL 09/08/2018 1403    Other culture-see note  Medications: Scheduled Meds: . buPROPion  100 mg Oral Daily  . carbidopa-levodopa  1 tablet Oral QHS  . Carbidopa-Levodopa ER  2 tablet Oral BID WC   And  . Carbidopa-Levodopa ER  1 tablet Oral QAC supper  . clonazePAM  0.5 mg Oral QHS  . enoxaparin (LOVENOX) injection  40 mg Subcutaneous Daily  . finasteride  5 mg Oral Daily  . furosemide  40 mg Intravenous BID  . Pimavanserin Tartrate  34 mg Oral Daily  . sodium chloride flush  3 mL Intravenous Q12H   Continuous Infusions: . sodium chloride      Antimicrobials: Anti-infectives (From admission, onward)   None     Objective: Vitals: Today's Vitals   07/06/20 0245 07/06/20 0345 07/06/20 0500 07/06/20 0600  BP: 116/68 109/67 108/67 (!) 101/57  Pulse: 63 69 68 69  Resp: (!) 28 14 19  (!) 31  Temp:      TempSrc:      SpO2: 94% 96% 96% 96%  PainSc:  Asleep     No intake or output data in the 24 hours ending 07/06/20 0731 There were no vitals filed for this visit. Weight  change:   Intake/Output from previous day: No intake/output data recorded. Intake/Output this shift: No intake/output data recorded.  Examination: General exam: Sleepy but able to wake up did not interact, not in distress, on nasal cannula HEENT:Oral mucosa moist, Ear/Nose WNL grossly,dentition normal. Respiratory system: bilaterally basal crackles,no wheezing or crackles,no use of accessory muscle, non tender. Cardiovascular system: S1 & S2 +, regular, No JVD. Gastrointestinal system: Abdomen soft, NT,ND, BS+. Nervous System: Demented does not follow instruction or commands.   Extremities: Bilateral lower extremity edema more on the left, distal peripheral pulses palpable.  Skin: No rashes,no icterus. MSK: Normal muscle bulk,tone, power  Data Reviewed: I have personally reviewed following labs and imaging studies CBC: Recent Labs  Lab 07/05/20 1555 07/06/20 0514  WBC 9.9 7.7  HGB 15.6 14.4  HCT 48.5 45.4  MCV 99.2 97.8  PLT 224 221   Basic Metabolic Panel: Recent Labs  Lab  07/05/20 1555 07/06/20 0514  NA 139 139  K 4.2 3.8  CL 103 102  CO2 27 26  GLUCOSE 115* 101*  BUN 21 19  CREATININE 1.05 1.11  CALCIUM 9.1 8.7*   GFR: CrCl cannot be calculated (Unknown ideal weight.). Liver Function Tests: No results for input(s): AST, ALT, ALKPHOS, BILITOT, PROT, ALBUMIN in the last 168 hours. No results for input(s): LIPASE, AMYLASE in the last 168 hours. No results for input(s): AMMONIA in the last 168 hours. Coagulation Profile: No results for input(s): INR, PROTIME in the last 168 hours. Cardiac Enzymes: No results for input(s): CKTOTAL, CKMB, CKMBINDEX, TROPONINI in the last 168 hours. BNP (last 3 results) No results for input(s): PROBNP in the last 8760 hours. HbA1C: No results for input(s): HGBA1C in the last 72 hours. CBG: No results for input(s): GLUCAP in the last 168 hours. Lipid Profile: No results for input(s): CHOL, HDL, LDLCALC, TRIG, CHOLHDL, LDLDIRECT  in the last 72 hours. Thyroid Function Tests: No results for input(s): TSH, T4TOTAL, FREET4, T3FREE, THYROIDAB in the last 72 hours. Anemia Panel: No results for input(s): VITAMINB12, FOLATE, FERRITIN, TIBC, IRON, RETICCTPCT in the last 72 hours. Sepsis Labs: No results for input(s): PROCALCITON, LATICACIDVEN in the last 168 hours.  Recent Results (from the past 240 hour(s))  Respiratory Panel by RT PCR (Flu A&B, Covid) - Nasopharyngeal Swab     Status: None   Collection Time: 07/05/20  6:00 PM   Specimen: Nasopharyngeal Swab  Result Value Ref Range Status   SARS Coronavirus 2 by RT PCR NEGATIVE NEGATIVE Final    Comment: (NOTE) SARS-CoV-2 target nucleic acids are NOT DETECTED.  The SARS-CoV-2 RNA is generally detectable in upper respiratoy specimens during the acute phase of infection. The lowest concentration of SARS-CoV-2 viral copies this assay can detect is 131 copies/mL. A negative result does not preclude SARS-Cov-2 infection and should not be used as the sole basis for treatment or other patient management decisions. A negative result may occur with  improper specimen collection/handling, submission of specimen other than nasopharyngeal swab, presence of viral mutation(s) within the areas targeted by this assay, and inadequate number of viral copies (<131 copies/mL). A negative result must be combined with clinical observations, patient history, and epidemiological information. The expected result is Negative.  Fact Sheet for Patients:  https://www.moore.com/  Fact Sheet for Healthcare Providers:  https://www.young.biz/  This test is no t yet approved or cleared by the [place] FDA and  has been authorized for detection and/or diagnosis of SARS-CoV-2 by FDA under an Emergency Use Authorization (EUA). This EUA will remain  in effect (meaning this test can be used) for the duration of the COVID-19 declaration under Section  564(b)(1) of the Act, 21 U.S.C. section 360bbb-3(b)(1), unless the authorization is terminated or revoked sooner.     Influenza A by PCR NEGATIVE NEGATIVE Final   Influenza B by PCR NEGATIVE NEGATIVE Final    Comment: (NOTE) The Xpert Xpress SARS-CoV-2/FLU/RSV assay is intended as an aid in  the diagnosis of influenza from Nasopharyngeal swab specimens and  should not be used as a sole basis for treatment. Nasal washings and  aspirates are unacceptable for Xpert Xpress SARS-CoV-2/FLU/RSV  testing.  Fact Sheet for Patients: https://www.moore.com/  Fact Sheet for Healthcare Providers: https://www.young.biz/  This test is not yet approved or cleared by the [place] FDA and  has been authorized for detection and/or diagnosis of SARS-CoV-2 by  FDA under an Emergency Use Authorization (EUA). This EUA will  remain  in effect (meaning this test can be used) for the duration of the  Covid-19 declaration under Section 564(b)(1) of the Act, 21  U.S.C. section 360bbb-3(b)(1), unless the authorization is  terminated or revoked. Performed at [facility], [address], [place], [place] [address]      Radiology Studies: DG Chest 2 View  Result Date: 07/05/2020 CLINICAL DATA:  Dyspnea, tachypnea, hypertension, dementia EXAM: CHEST - 2 VIEW COMPARISON:  11/13/2019 chest radiograph. FINDINGS: Stable cardiomediastinal silhouette with mild cardiomegaly. No pneumothorax. Small bilateral pleural effusions, left greater than right. Mild diffuse prominence of the parahilar interstitial markings. Mild hazy bibasilar lung opacities, favor atelectasis. IMPRESSION: 1. Small bilateral pleural effusions, left greater than right. 2. Mild congestive heart failure. Electronically Signed   By: Delbert Phenix M.D.   On: 07/05/2020 17:00   CT Angio Chest PE W and/or Wo Contrast  Addendum Date: 07/05/2020   ADDENDUM REPORT: 07/05/2020 21:47 ADDENDUM: Patient history:  Tachypnic with clammy complexion, elevated D-Dimer Additional impression and follow-up recommendation: Ascending thoracic aortic dilatation to 4.2 cm. Recommend annual imaging followup by CTA or MRA. This recommendation follows 2010 ACCF/AHA/AATS/ACR/ASA/SCA/SCAI/SIR/STS/SVM Guidelines for the Diagnosis and Management of Patients with Thoracic Aortic Disease. Circulation. 2010; 121: A213-Y865. Aortic aneurysm NOS (ICD10-I71.9) Electronically Signed   By: Kreg Shropshire M.D.   On: 07/05/2020 21:47   Result Date: 07/05/2020 CLINICAL DATA:  CT neck, clammy complexion, elevated D-dimer EXAM: CT ANGIOGRAPHY CHEST WITH CONTRAST TECHNIQUE: Multidetector CT imaging of the chest was performed using the standard protocol during bolus administration of intravenous contrast. Multiplanar CT image reconstructions and MIPs were obtained to evaluate the vascular anatomy. CONTRAST:  28mL OMNIPAQUE IOHEXOL 350 MG/ML SOLN COMPARISON:  Radiograph 07/05/2020, CT abdomen pelvis 09/08/2018 FINDINGS: Cardiovascular: Satisfactory opacification the pulmonary arteries to the segmental level. No pulmonary artery filling defects are identified. Central pulmonary arteries are borderline enlarged. Mild cardiomegaly some right heart enlargement. Coronary artery calcifications are present. No pericardial effusion. Fluid in the pericardial recesses is likely within physiologic normal. The aortic root is suboptimally assessed given cardiac pulsation artifact. Ascending aorta measures up to 4.2 cm in size returning to a more normal caliber 3 cm by the distal arch. Atherosclerotic plaque within the aorta. No acute luminal abnormality of the imaged aorta. No periaortic stranding or hemorrhage. Normal 3 vessel branching of the aortic arch. Proximal great vessels are tortuous but free of acute abnormality. No major venous abnormalities. Mediastinum/Nodes: No mediastinal fluid or gas. Normal thyroid gland and thoracic inlet. No acute abnormality of the  trachea or esophagus. Scattered low-attenuation mediastinal hilar nodes present largest including a 13 mm precarinal lymph node (5/57) and a 13 mm AP window lymph node (5/56). These are fairly low attenuation may be reactive or edematous. Lungs/Pleura: Lung volumes are low with atelectasis likely accentuated by imaging during exhalation. There is significant redistribution of the pulmonary vascularity with bronchial cuffing, fissural and septal thickening, and perihilar and dependent predominant hazy ground-glass opacity most suggestive of pulmonary edema with some small to moderate bilateral effusions and passive atelectatic changes. Some more bandlike opacities towards the lung bases could reflect additional subsegmental atelectasis or scarring. No pneumothorax. Upper Abdomen: Redemonstration of a cystic lesion measuring up to 14 mm in the dome of the liver. Some slightly increased attenuation may be related to the adjacent dense contrast bolus. (5/115). No other liver abnormalities are seen. Gallbladder mildly distended with layering partially calcified gallstones and some gradient attenuation, possibly biliary sludge. No pericholecystic fluid or  inflammation or biliary ductal dilatation is seen. Mild symmetric bilateral perinephric stranding, a nonspecific finding which may correlate with advanced age or decreased renal function the correlate for urinary symptoms. Musculoskeletal: Multilevel degenerative changes are present in the imaged portions of the spine. Levocurvature in exaggerated thoracic kyphosis with some associated chest wall deformity. Additional degenerative changes in the shoulders. No acute or worrisome osseous abnormality. Body wall edema most pronounced over the right flank and chest wall. No other acute or focal soft tissue abnormality. Review of the MIP images confirms the above findings. IMPRESSION: 1. No evidence of acute pulmonary artery embolism. 2. Features of congestive heart failure  with cardiomegaly, redistribution of the pulmonary vascularity, pulmonary edema, and small to moderate bilateral effusions with adjacent passive atelectatic changes. 3. Right heart enlargement and central pulmonary arterial enlargement may reflect some chronic pulmonary artery hypertension/right heart dysfunction. 4. Scattered low-attenuation borderline enlarged mediastinal and hilar nodes, may be reactive or edematous. 5. Cholelithiasis and likely biliary sludge without CT evidence of acute cholecystitis. 6. Mild symmetric bilateral perinephric stranding, a nonspecific finding which may correlate with advanced age or decreased renal function the correlate for urinary symptoms. 7. Aortic Atherosclerosis (ICD10-I70.0). 8. Coronary artery calcifications are present. Please note that the presence of coronary artery calcium documents the presence of coronary artery disease, the severity of this disease and any potential stenosis cannot be assessed on this non-gated CT examination. Electronically Signed: By: Kreg ShropshirePrice  DeHay M.D. On: 07/05/2020 21:09     LOS: 1 day   Randy Boastamesh Arsenia Goracke, MD [facility]  07/06/2020, 7:31 AM

## 2020-07-06 NOTE — ED Notes (Signed)
Attempted to give report, but nurse not answering phone

## 2020-07-06 NOTE — ED Notes (Signed)
Pt A-flutter on monitor

## 2020-07-06 NOTE — Consult Note (Signed)
Consultation Note Date: 07/06/2020   Patient Name: Randy Murphy  DOB: [birth year]  MRN: [id number]  Age / Sex: 86 y.o., male  PCP: Randy Infante, MD Referring Physician: Antonieta Pert, MD  Reason for Consultation: Establishing goals of care  HPI/Patient Profile: 86 y.o. male  with past medical history of Parkinson's disease, atrial fibrillation, dementia, CKD, CHF presented to the ED on 07/06/20 from home and per wife his blood pressure was elevated, he was clammy, and tachypneic.    In the ED, afebrile, saturating mid to upper 80s on room air, tachypneic, and with stable blood pressure. EKG nsr/chronic LBBB. Cxr-small bilateral pleural effusions and mild CHF findings.   Troponin slightly positive but flat 39, 46, 52, D-dimer 1.3, subsequently underwent CTA chest:negative for PE but notable for CHF,ascending thoracic aortic dilatation.COVID-19 PCR is negative. Patient was given 40 mg IV Lasix in the ED and was subsequently admitted.  Patient and family face treatment option decisions, advanced directive decisions, and anticipatory care needs.  Clinical Assessment and Goals of Care: I have reviewed medical records including EPIC notes, labs, and imaging. Received report from primary RN - no acute concerns. RN states patient did not eat any breakfast this morning and has been sleeping a lot.  Went to visit patient at bedside - wife/Randy Murphy was present feeding the patient lunch - he was accepting of food and ate about 30-40% of his meal. Patient was lying in bed awake, alert, not oriented, and minimally able to participate in conversation. No signs or non-verbal gestures of pain or discomfort noted. No respiratory distress, increased work of breathing, or secretions noted. The patient denied having pain or shortness of breath, he stated "I feel good." He is on 2.5L O2 [place].  Met with patient and wife  to discuss  diagnosis, prognosis, GOC, EOL wishes, disposition, and options.  I introduced Palliative Medicine as specialized medical care for people living with serious illness. It focuses on providing relief from the symptoms and stress of a serious illness. The goal is to improve quality of life for both the patient and the family.  We discussed a brief life review of the patient as well as functional and nutritional status. Mr. and Mrs. Murphy have been married for 61 years. They have 1 son together. The patient and Randy Murphy live together in a private residence where the patient enjoys watching TV, napping, looking out the window, and eating ice cream. They have caregivers in the home that assist with the patient's needs 24/7. The patient is bedbound - a hoyer lift has to be used for his transfers. He is able to sit in a wheelchair/chair throughout the day to get out of the bed and see different parts of the house. Randy Murphy reports the patient's appetite/PO intake has declined over the past month but states that he does still eat and drink.    We discussed patient's current illness and what it means in the larger context of patient's on-going co-morbidities. Natural disease trajectory and expectations at EOL were discussed.  Reviewed that Parkinson's, dementia, CKD, and CHF were all non-curable progressive diseases, which makes the patient high risk for rehospitalization. I attempted to elicit values and goals of care important to the patient. The difference between aggressive medical intervention and comfort care was considered in light of the patient's goals of care. Hospice and Palliative Care services outpatient were explained and offered. Randy Murphy explains that her goal is to have the patient discharged home as soon as possible without being rehospitalized in the future. She explains she wants him to be comfortable. Home hospice services and philosophy were discussed and explained in detail. Randy Murphy expressed interest  in home hospice.   Randy Murphy asked if the patient should be the one making these decisions. I asked her if she felt the patient was able to make complex medical decisions such as this - she agreed that he could not due to his dementia. Support was provided that it was ok for her to make decisions on his behalf if she felt they were in his best interest.   Visit also consisted of discussions dealing with the complex and emotionally intense issues of symptom management and palliative care in the setting of serious and potentially life-threatening illness. Palliative care team will continue to support patient, patient's family, and medical team.  Discussed with patient/family the importance of continued conversation with each other and the medical providers regarding overall plan of care and treatment options, ensuring decisions are within the context of the patient's values and GOCs.    Questions and concerns were addressed. The patient/family was encouraged to call with questions and/or concerns. PMT card was provided.    Primary Decision Maker: NEXT OF KIN - Randy Murphy - patient's wife    SUMMARY OF RECOMMENDATIONS:  Continue current medical treatment  Continue DNR/DNI as previously documented  Wife is open to getting more information about home hospice support from [place] liaison notified  Wife explains her goal is for the patient to not be rehospitalized after discharge and is open to home hospice support - she will decide after speaking with liaison  PMT will continue to follow holistically  Code Status/Advance Care Planning:  DNR  Palliative Prophylaxis:   Aspiration, Bowel Regimen, Delirium Protocol, Frequent Pain Assessment, Oral Care and Turn Reposition  Additional Recommendations (Limitations, Scope, Preferences):  Full Scope Treatment  Psycho-social/Spiritual:  Created space and opportunity for patient and family to express thoughts and feelings  regarding patient's current medical situation.   Emotional support and therapeutic listening provided.  Prognosis:   < 6 months  Discharge Planning: To Be Determined      Primary Diagnoses: Present on Admission: . Acute on chronic combined systolic and diastolic CHF (congestive heart failure) ([place]) . Dementia ([place]) . Parkinson disease ([place]) . Elevated troponin . Thoracic ascending aortic aneurysm ([place]) . PAF (paroxysmal atrial fibrillation) ([place])   I have reviewed the medical record, interviewed the patient and family, and examined the patient. The following aspects are pertinent.  Past Medical History:  Diagnosis Date  . Chronic combined systolic and diastolic CHF (congestive heart failure) ([place])   . CKD (chronic kidney disease), stage III ([place])    a. stage II-III by labs, Cr 1.1-1.22 in 09/2017, 1.3-1.4 in 06/2018.  Marland Kitchen Dementia ([place])   . Fatigue   . Frequent falls   . Hearing loss of both ears   . History of orthostatic hypotension   . Hyperlipidemia   . Hypersomnia, periodic 02/18/2014  . Hypertension   . Insomnia   .  Junctional bradycardia   . Moderate aortic insufficiency   . Osteopenia   . PAF (paroxysmal atrial fibrillation) ([place])    a. noted in hospital 06/2018, not on anticoag due to h/o frequent falling.  . Parkinson's disease    Possible early parkinsons disease  . Pulmonary hypertension ([place])   . Sinus bradycardia   . Tricuspid regurgitation    Social History   Socioeconomic History  . Marital status: Married    Spouse name: Randy Murphy  . Number of children: 1  . Years of education: 35  . Highest education level: Not on file  Occupational History  . Occupation: retired    Comment: Designer, fashion/clothing - Adult nurse  Tobacco Use  . Smoking status: Never Smoker  . Smokeless tobacco: Never Used  Vaping Use  . Vaping Use: Never used  Substance and Sexual Activity  . Alcohol use: No  . Drug use: No  . Sexual activity: Not on file  Other  Topics Concern  . Not on file  Social History Narrative   Patient is married Randy Murphy) and lives at home with his wife.   Patient has one adult child.   Patient is retired.   Patient has a college education.   Patient is right-handed.   Patient drinks one cup of coffee daily.   Social Determinants of Health   Financial Resource Strain:   . Difficulty of Paying Living Expenses: Not on file  Food Insecurity:   . Worried About Charity fundraiser in the Last Year: Not on file  . Ran Out of Food in the Last Year: Not on file  Transportation Needs:   . Lack of Transportation (Medical): Not on file  . Lack of Transportation (Non-Medical): Not on file  Physical Activity:   . Days of Exercise per Week: Not on file  . Minutes of Exercise per Session: Not on file  Stress:   . Feeling of Stress : Not on file  Social Connections:   . Frequency of Communication with Friends and Family: Not on file  . Frequency of Social Gatherings with Friends and Family: Not on file  . Attends Religious Services: Not on file  . Active Member of Clubs or Organizations: Not on file  . Attends Archivist Meetings: Not on file  . Marital Status: Not on file   Family History  Problem Relation Age of Onset  . Stroke Sister    Scheduled Meds: . buPROPion  100 mg Oral Daily  . carbidopa-levodopa  1 tablet Oral QHS  . Carbidopa-Levodopa ER  2 tablet Oral BID WC   And  . Carbidopa-Levodopa ER  1 tablet Oral QAC supper  . clonazePAM  0.5 mg Oral QHS  . enoxaparin (LOVENOX) injection  40 mg Subcutaneous Daily  . finasteride  5 mg Oral Daily  . furosemide  40 mg Intravenous BID  . Pimavanserin Tartrate  34 mg Oral Daily  . sodium chloride flush  3 mL Intravenous Q12H   Continuous Infusions: . sodium chloride     PRN Meds:.sodium chloride, acetaminophen, ondansetron (ZOFRAN) IV, sodium chloride flush Medications Prior to Admission:  Prior to Admission medications   Medication Sig Start Date  End Date Taking? Authorizing Provider  buPROPion (WELLBUTRIN SR) 100 MG 12 hr tablet Take 100 mg by mouth daily. 04/02/20  Yes [provider]  carbidopa-levodopa (SINEMET CR) 50-200 MG tablet TAKE 1 TABLET BY MOUTH AT  BEDTIME 04/23/20  Yes Tat, Eustace Quail, DO  Carbidopa-Levodopa ER (  SINEMET CR) 25-100 MG tablet controlled release Take 2 tabs in the morning; 2 tabs in the afternoon and 1 tab in the evening Patient taking differently: Take 1-2 tablets by mouth See admin instructions. Take 2 tabs in the morning; 2 tabs in the afternoon and 1 tab in the evening 04/23/20  Yes Tat, Rebecca S, DO  chlorhexidine (PERIDEX) 0.12 % solution 15 mLs by Mouth Rinse route at bedtime. Rinse and spit 06/20/18  Yes [provider]  cholecalciferol (VITAMIN D3) 25 MCG (1000 UT) tablet Take 1,000 Units by mouth daily.   Yes [provider]  clonazePAM (KLONOPIN) 0.5 MG tablet Take 1 tablet (0.5 mg total) by mouth at bedtime. 07/14/18  Yes Dana Allan I, MD  Co-Enzyme Q-10 100 MG CAPS Take 200 mg by mouth at bedtime.    Yes [provider]  finasteride (PROSCAR) 5 MG tablet Take 5 mg by mouth daily.     Yes [provider]  ibandronate (BONIVA) 150 MG tablet Take 150 mg by mouth every 30 (thirty) days. Take in the morning with a full glass of water, on an empty stomach, and do not take anything else by mouth or lie down for the next 30 min.   Yes [provider]  Multiple Vitamin (MULTIVITAMIN WITH MINERALS) TABS tablet Take 1 tablet by mouth daily.   Yes [provider]  Multiple Vitamins-Minerals (PRESERVISION AREDS 2 PO) Take 1 capsule by mouth 2 (two) times daily.   Yes [provider]  Pimavanserin Tartrate (NUPLAZID) 34 MG CAPS Take 1 capsule (34 mg total) by mouth daily. 10/17/19  Yes Tat, Eustace Quail, DO  Probiotic Product (PROBIOTIC-10 PO) Take 1 tablet by mouth daily.   Yes [provider]  psyllium (METAMUCIL) 58.6 % powder Take 1  packet by mouth daily as needed (fiber).   Yes [provider]  AMBULATORY NON FORMULARY MEDICATION Lift Chair DX: G20 08/03/18   Tat, Eustace Quail, DO   No Known Allergies Review of Systems  Unable to perform ROS: Dementia    Physical Exam Vitals and nursing note reviewed.  Constitutional:      General: He is not in acute distress.    Comments: Frail appearing  Pulmonary:     Effort: No respiratory distress.  Skin:    General: Skin is warm and dry.  Neurological:     Mental Status: He is alert. Mental status is at baseline.     Motor: Weakness present.  Psychiatric:        Behavior: Behavior is cooperative.        Cognition and Memory: Cognition is impaired. Memory is impaired.     Vital Signs: BP 123/66   Pulse 71   Temp 98.2 F (36.8 C) (Axillary)   Resp (!) 33   SpO2 96%  Pain Scale: 0-10   Pain Score: Asleep   SpO2: SpO2: 96 % O2 Device:SpO2: 96 % O2 Flow Rate: .O2 Flow Rate (L/min): 2 L/min  IO: Intake/output summary: No intake or output data in the 24 hours ending 07/06/20 1315  LBM:   Baseline Weight:   Most recent weight:       Palliative Assessment/Data: PPS 30%     Time In: 1315 Time Out: 1427 Time Total: 72 minutes  Greater than 50%  of this time was spent counseling and coordinating care related to the above assessment and plan.  Signed by: Lin Landsman, NP   Please contact Palliative Medicine Team phone at [phone]  for questions and concerns.  For individual provider: See Shea Evans

## 2020-07-07 ENCOUNTER — Inpatient Hospital Stay (HOSPITAL_COMMUNITY): Payer: Medicare Other

## 2020-07-07 DIAGNOSIS — I5022 Chronic systolic (congestive) heart failure: Secondary | ICD-10-CM | POA: Diagnosis not present

## 2020-07-07 DIAGNOSIS — L8990 Pressure ulcer of unspecified site, unspecified stage: Secondary | ICD-10-CM | POA: Insufficient documentation

## 2020-07-07 LAB — BASIC METABOLIC PANEL
Anion gap: 12 (ref 5–15)
Anion gap: 11 (ref 5–15)
BUN: 26 mg/dL — ABNORMAL HIGH (ref 8–23)
BUN: 24 mg/dL — ABNORMAL HIGH (ref 8–23)
CO2: 29 mmol/L (ref 22–32)
CO2: 32 mmol/L (ref 22–32)
Calcium: 9 mg/dL (ref 8.9–10.3)
Calcium: 9.1 mg/dL (ref 8.9–10.3)
Chloride: 101 mmol/L (ref 98–111)
Chloride: 99 mmol/L (ref 98–111)
Creatinine, Ser: 1.33 mg/dL — ABNORMAL HIGH (ref 0.61–1.24)
Creatinine, Ser: 1.28 mg/dL — ABNORMAL HIGH (ref 0.61–1.24)
GFR, Estimated: 52 mL/min — ABNORMAL LOW (ref >60)
GFR, Estimated: 55 mL/min — ABNORMAL LOW (ref >60)
Glucose, Bld: 102 mg/dL — ABNORMAL HIGH (ref 70–99)
Glucose, Bld: 125 mg/dL — ABNORMAL HIGH (ref 70–99)
Potassium: 3.6 mmol/L (ref 3.5–5.1)
Potassium: 3.5 mmol/L (ref 3.5–5.1)
Sodium: 142 mmol/L (ref 135–145)
Sodium: 142 mmol/L (ref 135–145)

## 2020-07-07 LAB — ECHOCARDIOGRAM LIMITED
Area-P 1/2: 2.83 cm2
Height: 72 in
P 1/2 time: 631 msec
S' Lateral: 4.43 cm
Weight: 2974.4 oz

## 2020-07-07 LAB — CBC
HCT: 46.8 % (ref 39.0–52.0)
Hemoglobin: 15 g/dL (ref 13.0–17.0)
MCH: 31.2 pg (ref 26.0–34.0)
MCHC: 32.1 g/dL (ref 30.0–36.0)
MCV: 97.3 fL (ref 80.0–100.0)
Platelets: 234 10*3/uL (ref 150–400)
RBC: 4.81 MIL/uL (ref 4.22–5.81)
RDW: 13 % (ref 11.5–15.5)
WBC: 8.7 10*3/uL (ref 4.0–10.5)
nRBC: 0 % (ref 0.0–0.2)

## 2020-07-07 LAB — BRAIN NATRIURETIC PEPTIDE: B Natriuretic Peptide: 821.4 pg/mL — ABNORMAL HIGH (ref 0.0–100.0)

## 2020-07-07 MED ORDER — ENOXAPARIN SODIUM 40 MG/0.4ML ~~LOC~~ SOLN
40.0000 mg | SUBCUTANEOUS | Status: DC
  Administered 2020-07-08: 40 mg via SUBCUTANEOUS
  Filled 2020-07-07: qty 0.4

## 2020-07-07 MED ORDER — FUROSEMIDE 10 MG/ML IJ SOLN
20.0000 mg | Freq: Every day | INTRAMUSCULAR | Status: DC
  Administered 2020-07-07 – 2020-07-08 (×2): 20 mg via INTRAVENOUS
  Filled 2020-07-07 (×2): qty 2

## 2020-07-07 NOTE — Progress Notes (Signed)
  Echocardiogram 2D Echocardiogram has been performed.  Manal Kreutzer G Albaraa Swingle 07/07/2020, 1:15 PM

## 2020-07-07 NOTE — Progress Notes (Signed)
Patent examiner [facility])  Hospital Liaison: RN note          Writer spoke with patient's wife to explain hospice and palliative services.  Wife prefers to start with our Palliative program then transition to hospice at a later time as appropriate.          [facility] Palliative team will follow up with patient after discharge.         Please call with any hospice or palliative related questions.         Thank you for this referral.         Elsie Saas, RN, CCM  [facility] Liaison (listed on [facility] under Hospice/[facility])    [phone]

## 2020-07-07 NOTE — Evaluation (Signed)
Physical Therapy Evaluation Patient Details Name: Randy Murphy MRN: [id number] DOB: 1932-07-09 Today's Date: 07/07/2020   History of Present Illness  86 year old male with history of chronic combined systolic and diastolic CHF, CKD stage III, Parkinson's dementia/hearing loss/frequent falls/insomnia, paroxysmal A. Fib- not on anticoagulation due to falls,moderate aortic insufficiency, pulmonary hypertension, sinus bradycardia, hypertension, hyperlipidemia, history of orthostatic hypotension presented with complaint of fast breathing, elevated blood pressure, increased work of breathing over the past few days to week.  Per chart, wife wants to take pt home with Hospice care.   Clinical Impression  Pt admitted with above diagnosis. Pt eval was bed level due to pt incontinent of bladder and bowel.  Total assist to clean pt and total assist to roll both directions. Plan is for pt to go home today or tomorrow with Hospice.  Pt currently with functional limitations due to the deficits listed below (see PT Problem List). Pt will benefit from skilled PT to increase their independence and safety with mobility to allow discharge to the venue listed below.      Follow Up Recommendations Home health PT;Supervision/Assistance - 24 hour (Hospice care per chart, unsure if pt will get PT)    Equipment Recommendations  Other (comment) (Per chart they have equipment needed)    Recommendations for Other Services       Precautions / Restrictions Precautions Precautions: Fall Restrictions Weight Bearing Restrictions: No      Mobility  Bed Mobility Overal bed mobility: Needs Assistance Bed Mobility: Rolling Rolling: Max assist;Total assist         General bed mobility comments: Pts condom cath was off on arrival and bed wet. Also discovered pt had BM once rolled to clean pt. Cleaned pt with total assist and needs incr assist and cues to roll with pt not assisting much at all.    Transfers                  General transfer comment: Did not sit EOB due to max to total assist to roll with one person.  Pt used lift to get OOB at home per chart.   Ambulation/Gait             General Gait Details: does not walk per chart  Stairs            Wheelchair Mobility    Modified Rankin (Stroke Patients Only)       Balance                                             Pertinent Vitals/Pain Pain Assessment: Faces Faces Pain Scale: Hurts little more Pain Location: generalized Pain Descriptors / Indicators: Grimacing;Guarding;Discomfort Pain Intervention(s): Limited activity within patient's tolerance;Monitored during session;Repositioned    Home Living Family/patient expects to be discharged to:: Private residence Living Arrangements: Spouse/significant other Available Help at Discharge: Personal care attendant;Available 24 hours/day Type of Home: House Home Access: Ramped entrance     Home Layout: One level Home Equipment: Walker - 2 wheels;Transport chair;Other (comment);Hospital bed (mechanical lift) Additional Comments: 2LO2 currently    Prior Function Level of Independence: Needs assistance   Gait / Transfers Assistance Needed: pt performs sit to stand transfers to assist with ADLs, otherwise is hoyer lifted for all transfers  ADL's / Homemaking Assistance Needed: pt is able to feed himself, requires significant assist with all ADLs  Hand Dominance   Dominant Hand: Right    Extremity/Trunk Assessment   Upper Extremity Assessment Upper Extremity Assessment: Defer to OT evaluation    Lower Extremity Assessment Lower Extremity Assessment: RLE deficits/detail;LLE deficits/detail RLE Deficits / Details: grossly 2/5 LLE Deficits / Details: grossly 2/5    Cervical / Trunk Assessment Cervical / Trunk Assessment: Kyphotic  Communication   Communication: HOH  Cognition Arousal/Alertness: Awake/alert Behavior During Therapy:  Flat affect Overall Cognitive Status: History of cognitive impairments - at baseline                                        General Comments General comments (skin integrity, edema, etc.): VSS    Exercises General Exercises - Upper Extremity Shoulder Flexion: AAROM;Both;5 reps;Supine Elbow Flexion: AROM;Both;10 reps;Supine General Exercises - Lower Extremity Ankle Circles/Pumps: AROM;Both;10 reps;Supine Heel Slides: AAROM;Both;5 reps;Supine   Assessment/Plan    PT Assessment Patient needs continued PT services  PT Problem List Decreased activity tolerance;Decreased strength;Decreased range of motion;Decreased balance;Decreased mobility;Decreased knowledge of use of DME;Decreased safety awareness;Decreased cognition;Decreased knowledge of precautions       PT Treatment Interventions DME instruction;Functional mobility training;Therapeutic activities;Therapeutic exercise;Balance training;Patient/family education    PT Goals (Current goals can be found in the Care Plan section)  Acute Rehab PT Goals Patient Stated Goal: unable to state PT Goal Formulation: Patient unable to participate in goal setting Time For Goal Achievement: 07/21/20 Potential to Achieve Goals: Fair    Frequency Min 3X/week   Barriers to discharge        Co-evaluation               AM-PAC PT "6 Clicks" Mobility  Outcome Measure Help needed turning from your back to your side while in a flat bed without using bedrails?: Total Help needed moving from lying on your back to sitting on the side of a flat bed without using bedrails?: Total Help needed moving to and from a bed to a chair (including a wheelchair)?: Total Help needed standing up from a chair using your arms (e.g., wheelchair or bedside chair)?: Total Help needed to walk in hospital room?: Total Help needed climbing 3-5 steps with a railing? : Total 6 Click Score: 6    End of Session Equipment Utilized During Treatment:  Oxygen (2L) Activity Tolerance: Patient limited by fatigue Patient left: in bed;with call bell/phone within reach;with bed alarm set Nurse Communication: Mobility status;Need for lift equipment PT Visit Diagnosis: Muscle weakness (generalized) (M62.81);Other abnormalities of gait and mobility (R26.89)    Time: 2751-7001 PT Time Calculation (min) (ACUTE ONLY): 13 min   Charges:   PT Evaluation $PT Eval Moderate Complexity: 1 Mod          Unita Detamore W,PT Acute Rehabilitation Services Pager:  [phone]  Office:  [phone]    Berline Lopes 07/07/2020, 3:43 PM

## 2020-07-07 NOTE — Progress Notes (Signed)
Contacted pharmacy regarding patient med Pimavanserin Tartrate. Spouse stated he takes this daily to decrease/manage visual and auditory hallucinations. Pharmacy states that this is not carried by the hospital and recommended the spouse bring this medication in and we can dispense it in this manner. Spouse agreed and stated she would bring it in the next time she comes from home, as patient has been on this medication for 2 years. However, spouse also stated she thought patient may be going home today, so it may be a nonissue. I do know that he was seen by Hospice and spouse has decided on palliative at this time.

## 2020-07-07 NOTE — Progress Notes (Signed)
PROGRESS NOTE    Randy Murphy  DGU:[id number] DOB: 16-May-1932 DOA: 07/05/2020 PCP: Rodrigo Ran, MD    Brief Narrative: 86 year old male with history of chronic combined systolic and diastolic CHF, CKD stage III, Parkinson's dementia/hearing loss/frequent falls/insomnia, paroxysmal A. Fib- not on anticoagulation due to falls, , moderate aortic insufficiency, pulmonary hypertension, sinus bradycardia, hypertension, hyperlipidemia, history of orthostatic hypotension presented with complaint of fast breathing, elevated blood pressure, increased work of breathing over the past few days to week.  As per report patient having slightly increased work of breathing over the past few days to a week before worsening significantly today. His blood pressure was also noted to be elevated to the 180/90 range at home which concerned his family. His wife explains that the typically try to avoid salty foods, but ordered some takeout last night that was very salty. Patient has not been complaining of any chest pain, has not been coughing much, and no fevers or chills have been noted.  In the ED, afebrile, saturating mid to upper 80s on room air, tachypneic, and with stable blood pressure.  EKG nsr/chronic LBBB.  Cxr-small bilateral pleural effusions and mild CHF findings.    Troponin slightly positive but flat 39, 46, 52, D-dimer 1.3, subsequently underwent CTA chest:negative for PE but notable for CHF,ascending thoracic aortic dilatation. COVID-19 PCR is negative.  Patient was given 40 mg IV Lasix in the ED and was subsequently admitted.  Subjective:  Feels thirsty aao at baseline asking 'Why  Am I here?" No shortness of breath or chest pain Creat up and lasix held, labs ordered for afternoon  Assessment & Plan:  Acute on chronic combined systolic and diastolic CHF: Suspected from dietary indiscretion/salty meal.Eho from 2021 March EF 35 to 40%, global hypokinesia and grade 1 DD. ProBNP-1309>800. Held lasix  this am, repeat bmp with creat a 1.2, cont lasix 20 mg iv daily. Monitor intake output Daily weight.  Clinically improving.  Continue PT OT.    Elevated troponin, flat 39, 46, 52,, no acute ischemic changes on EKG no chest pain suspected type II demand mismatch in the setting of acute CHF.  Supportive measures for CHF  CKD stage II, BUN/creatinine seems stable at 1.1. monitor, slightly up creatinine 1.3, repeat is 1.8, monitor while on Lasix.  Parkinson's dementia/hearing loss/frequent falls/insomnia: Is alert oriented much more coherent today.  Continue his home Sinement, Klonopin and Wellbutrin.   Paf:not on anticoagulation due to falls, in NSR.  Monitor in telemetry.  Thoracic ascending aortic aneurysm/Moderate aortic insufficiency/TR/Pulmonary hypertension: Incidentally noted thoracic aortic dilation, will advise annual follow-up with imaging, PCP follow-up.  Hypertension/ history of orthostatic hypotension: BP stable.  Not on antihypertensives.    BPH continue external catheter, continue Proscar.  Goals of care:DNR.  Appreciate palliative care consultation on board.  Hospice palliative care discussion initiated, noted plan for paliative care follow-up initially and transfer to home hospice at a later time as appropriate.  Nutrition: Diet Order            Diet Heart Room service appropriate? Yes; Fluid consistency: Thin; Fluid restriction: 1500 mL Fluid  Diet effective now                Body mass index is 25.21 kg/m.   DVT prophylaxis: enoxaparin (LOVENOX) injection 40 mg Start: 07/06/20 1000 Code Status:   Code Status: DNR  Family Communication: plan of care discussed with the patient.  I have updated patient's wife yesterday. Will update today again  Status is:  Inpatient  Remains inpatient appropriate because:IV treatments appropriate due to intensity of illness or inability to take PO and Inpatient level of care appropriate due to severity of illness   Dispo: The  patient is from: Home              Anticipated d/c is to: Home w/ HH-obtain PT OT evaluation.              Anticipated d/c date is: 1 day if remains euvolemic              Patient currently is not medically stable to d/c.   Consultants:see note  Procedures:see note  Culture/Microbiology    Component Value Date/Time   SDES  09/08/2018 1403    BLOOD LEFT ANTECUBITAL Performed at [facility], [address] [place], [place] [address]    SPECREQUEST  09/08/2018 1403    BOTTLES DRAWN AEROBIC AND ANAEROBIC Blood Culture results may not be optimal due to an excessive volume of blood received in culture bottles Performed at [facility], [address], [place], [place] [address]    CULT  09/08/2018 1403    NO GROWTH 5 DAYS Performed at [facility], [address], [place], [place] [address]    REPTSTATUS 09/13/2018 FINAL 09/08/2018 1403    Other culture-see note  Medications: Scheduled Meds:  buPROPion  100 mg Oral Daily   carbidopa-levodopa  1 tablet Oral QHS   Carbidopa-Levodopa ER  2 tablet Oral BID WC   And   Carbidopa-Levodopa ER  1 tablet Oral QAC supper   clonazePAM  0.5 mg Oral QHS   enoxaparin (LOVENOX) injection  40 mg Subcutaneous Daily   finasteride  5 mg Oral Daily   influenza vaccine adjuvanted  0.5 mL Intramuscular Tomorrow-1000   Pimavanserin Tartrate  34 mg Oral Daily   sodium chloride flush  3 mL Intravenous Q12H   Continuous Infusions:  sodium chloride      Antimicrobials: Anti-infectives (From admission, onward)   None     Objective: Vitals: Today's Vitals   07/06/20 1541 07/06/20 1816 07/06/20 2140 07/07/20 0433  BP: 131/72 140/81 131/80 135/67  Pulse: 77 74 84 81  Resp: (!) 22 18 20 20   Temp: 97.7 F (36.5 C) 97.8 F (36.6 C) 97.7 F (36.5 C) 97.6 F (36.4 C)  TempSrc: Oral Oral Oral Oral  SpO2: 100% 100% 95% 96%  Weight:    84.3 kg  Height:      PainSc: 0-No pain        Intake/Output Summary (Last 24 hours) at 07/07/2020 0850 Last data filed at 07/07/2020 0300 Gross per 24 hour  Intake 240 ml  Output 1000 ml  Net -760 ml   Filed Weights   07/06/20 1500 07/07/20 0433  Weight: 84.2 kg 84.3 kg   Weight change:   Intake/Output from previous day: 11/07 0701 - 11/08 0700 In: 240 [P.O.:240] Out: 1000 [Urine:1000] Intake/Output this shift: No intake/output data recorded.  Examination: General exam: AAO x2, NAD, weak appearing. HEENT:Oral mucosa moist, Ear/Nose WNL grossly, dentition normal. Respiratory system: bilaterally clear,no wheezing or crackles,no use of accessory muscle Cardiovascular system: S1 & S2 +, No JVD,. Gastrointestinal system: Abdomen soft, NT,ND, BS+ Nervous System:Alert, awake, moving extremities and grossly nonfocal Extremities: No edema, distal peripheral pulses palpable.  Skin: No rashes,no icterus. MSK: Normal muscle bulk,tone, power  Data Reviewed: I have personally reviewed following labs and imaging studies CBC: Recent Labs  Lab 07/05/20 1555 07/06/20  1308 07/07/20 0454  WBC 9.9 7.7 8.7  HGB 15.6 14.4 15.0  HCT 48.5 45.4 46.8  MCV 99.2 97.8 97.3  PLT 224 221 234   Basic Metabolic Panel: Recent Labs  Lab 07/05/20 1555 07/06/20 0514 07/07/20 0454  NA 139 139 142  K 4.2 3.8 3.6  CL 103 102 101  CO2 GLUCOSE 115* 101* 102*  BUN 21 19 26*  CREATININE 1.05 1.11 1.33*  CALCIUM 9.1 8.7* 9.0   GFR: Estimated Creatinine Clearance: 43.8 mL/min (A) (by C-G formula based on SCr of 1.33 mg/dL (H)). Liver Function Tests: No results for input(s): AST, ALT, ALKPHOS, BILITOT, PROT, ALBUMIN in the last 168 hours. No results for input(s): LIPASE, AMYLASE in the last 168 hours. No results for input(s): AMMONIA in the last 168 hours. Coagulation Profile: No results for input(s): INR, PROTIME in the last 168 hours. Cardiac Enzymes: No results for input(s): CKTOTAL, CKMB, CKMBINDEX, TROPONINI in the last 168  hours. BNP (last 3 results) No results for input(s): PROBNP in the last 8760 hours. HbA1C: No results for input(s): HGBA1C in the last 72 hours. CBG: No results for input(s): GLUCAP in the last 168 hours. Lipid Profile: No results for input(s): CHOL, HDL, LDLCALC, TRIG, CHOLHDL, LDLDIRECT in the last 72 hours. Thyroid Function Tests: No results for input(s): TSH, T4TOTAL, FREET4, T3FREE, THYROIDAB in the last 72 hours. Anemia Panel: No results for input(s): VITAMINB12, FOLATE, FERRITIN, TIBC, IRON, RETICCTPCT in the last 72 hours. Sepsis Labs: No results for input(s): PROCALCITON, LATICACIDVEN in the last 168 hours.  Recent Results (from the past 240 hour(s))  Respiratory Panel by RT PCR (Flu A&B, Covid) - Nasopharyngeal Swab     Status: None   Collection Time: 07/05/20  6:00 PM   Specimen: Nasopharyngeal Swab  Result Value Ref Range Status   SARS Coronavirus 2 by RT PCR NEGATIVE NEGATIVE Final    Comment: (NOTE) SARS-CoV-2 target nucleic acids are NOT DETECTED.  The SARS-CoV-2 RNA is generally detectable in upper respiratoy specimens during the acute phase of infection. The lowest concentration of SARS-CoV-2 viral copies this assay can detect is 131 copies/mL. A negative result does not preclude SARS-Cov-2 infection and should not be used as the sole basis for treatment or other patient management decisions. A negative result may occur with  improper specimen collection/handling, submission of specimen other than nasopharyngeal swab, presence of viral mutation(s) within the areas targeted by this assay, and inadequate number of viral copies (<131 copies/mL). A negative result must be combined with clinical observations, patient history, and epidemiological information. The expected result is Negative.  Fact Sheet for Patients:  https://www.moore.com/  Fact Sheet for Healthcare Providers:  https://www.young.biz/  This test is no t yet  approved or cleared by the [place] FDA and  has been authorized for detection and/or diagnosis of SARS-CoV-2 by FDA under an Emergency Use Authorization (EUA). This EUA will remain  in effect (meaning this test can be used) for the duration of the COVID-19 declaration under Section 564(b)(1) of the Act, 21 U.S.C. section 360bbb-3(b)(1), unless the authorization is terminated or revoked sooner.     Influenza A by PCR NEGATIVE NEGATIVE Final   Influenza B by PCR NEGATIVE NEGATIVE Final    Comment: (NOTE) The Xpert Xpress SARS-CoV-2/FLU/RSV assay is intended as an aid in  the diagnosis of influenza from Nasopharyngeal swab specimens and  should not be used as a sole basis for treatment. Nasal washings and  aspirates are unacceptable  for Xpert Xpress SARS-CoV-2/FLU/RSV  testing.  Fact Sheet for Patients: https://www.moore.com/  Fact Sheet for Healthcare Providers: https://www.young.biz/  This test is not yet approved or cleared by the [place] FDA and  has been authorized for detection and/or diagnosis of SARS-CoV-2 by  FDA under an Emergency Use Authorization (EUA). This EUA will remain  in effect (meaning this test can be used) for the duration of the  Covid-19 declaration under Section 564(b)(1) of the Act, 21  U.S.C. section 360bbb-3(b)(1), unless the authorization is  terminated or revoked. Performed at [facility], [address] [place], [place] [address]      Radiology Studies: DG Chest 2 View  Result Date: 07/05/2020 CLINICAL DATA:  Dyspnea, tachypnea, hypertension, dementia EXAM: CHEST - 2 VIEW COMPARISON:  11/13/2019 chest radiograph. FINDINGS: Stable cardiomediastinal silhouette with mild cardiomegaly. No pneumothorax. Small bilateral pleural effusions, left greater than right. Mild diffuse prominence of the parahilar interstitial markings. Mild hazy bibasilar lung opacities, favor atelectasis. IMPRESSION: 1.  Small bilateral pleural effusions, left greater than right. 2. Mild congestive heart failure. Electronically Signed   By: Delbert Phenix M.D.   On: 07/05/2020 17:00   CT Angio Chest PE W and/or Wo Contrast  Addendum Date: 07/05/2020   ADDENDUM REPORT: 07/05/2020 21:47 ADDENDUM: Patient history: Tachypnic with clammy complexion, elevated D-Dimer Additional impression and follow-up recommendation: Ascending thoracic aortic dilatation to 4.2 cm. Recommend annual imaging followup by CTA or MRA. This recommendation follows 2010 ACCF/AHA/AATS/ACR/ASA/SCA/SCAI/SIR/STS/SVM Guidelines for the Diagnosis and Management of Patients with Thoracic Aortic Disease. Circulation. 2010; 121: S568-L275. Aortic aneurysm NOS (ICD10-I71.9) Electronically Signed   By: Kreg Shropshire M.D.   On: 07/05/2020 21:47   Result Date: 07/05/2020 CLINICAL DATA:  CT neck, clammy complexion, elevated D-dimer EXAM: CT ANGIOGRAPHY CHEST WITH CONTRAST TECHNIQUE: Multidetector CT imaging of the chest was performed using the standard protocol during bolus administration of intravenous contrast. Multiplanar CT image reconstructions and MIPs were obtained to evaluate the vascular anatomy. CONTRAST:  49mL OMNIPAQUE IOHEXOL 350 MG/ML SOLN COMPARISON:  Radiograph 07/05/2020, CT abdomen pelvis 09/08/2018 FINDINGS: Cardiovascular: Satisfactory opacification the pulmonary arteries to the segmental level. No pulmonary artery filling defects are identified. Central pulmonary arteries are borderline enlarged. Mild cardiomegaly some right heart enlargement. Coronary artery calcifications are present. No pericardial effusion. Fluid in the pericardial recesses is likely within physiologic normal. The aortic root is suboptimally assessed given cardiac pulsation artifact. Ascending aorta measures up to 4.2 cm in size returning to a more normal caliber 3 cm by the distal arch. Atherosclerotic plaque within the aorta. No acute luminal abnormality of the imaged aorta. No  periaortic stranding or hemorrhage. Normal 3 vessel branching of the aortic arch. Proximal great vessels are tortuous but free of acute abnormality. No major venous abnormalities. Mediastinum/Nodes: No mediastinal fluid or gas. Normal thyroid gland and thoracic inlet. No acute abnormality of the trachea or esophagus. Scattered low-attenuation mediastinal hilar nodes present largest including a 13 mm precarinal lymph node (5/57) and a 13 mm AP window lymph node (5/56). These are fairly low attenuation may be reactive or edematous. Lungs/Pleura: Lung volumes are low with atelectasis likely accentuated by imaging during exhalation. There is significant redistribution of the pulmonary vascularity with bronchial cuffing, fissural and septal thickening, and perihilar and dependent predominant hazy ground-glass opacity most suggestive of pulmonary edema with some small to moderate bilateral effusions and passive atelectatic changes. Some more bandlike opacities towards the lung bases could reflect additional subsegmental atelectasis or scarring. No pneumothorax. Upper  Abdomen: Redemonstration of a cystic lesion measuring up to 14 mm in the dome of the liver. Some slightly increased attenuation may be related to the adjacent dense contrast bolus. (5/115). No other liver abnormalities are seen. Gallbladder mildly distended with layering partially calcified gallstones and some gradient attenuation, possibly biliary sludge. No pericholecystic fluid or inflammation or biliary ductal dilatation is seen. Mild symmetric bilateral perinephric stranding, a nonspecific finding which may correlate with advanced age or decreased renal function the correlate for urinary symptoms. Musculoskeletal: Multilevel degenerative changes are present in the imaged portions of the spine. Levocurvature in exaggerated thoracic kyphosis with some associated chest wall deformity. Additional degenerative changes in the shoulders. No acute or worrisome  osseous abnormality. Body wall edema most pronounced over the right flank and chest wall. No other acute or focal soft tissue abnormality. Review of the MIP images confirms the above findings. IMPRESSION: 1. No evidence of acute pulmonary artery embolism. 2. Features of congestive heart failure with cardiomegaly, redistribution of the pulmonary vascularity, pulmonary edema, and small to moderate bilateral effusions with adjacent passive atelectatic changes. 3. Right heart enlargement and central pulmonary arterial enlargement may reflect some chronic pulmonary artery hypertension/right heart dysfunction. 4. Scattered low-attenuation borderline enlarged mediastinal and hilar nodes, may be reactive or edematous. 5. Cholelithiasis and likely biliary sludge without CT evidence of acute cholecystitis. 6. Mild symmetric bilateral perinephric stranding, a nonspecific finding which may correlate with advanced age or decreased renal function the correlate for urinary symptoms. 7. Aortic Atherosclerosis (ICD10-I70.0). 8. Coronary artery calcifications are present. Please note that the presence of coronary artery calcium documents the presence of coronary artery disease, the severity of this disease and any potential stenosis cannot be assessed on this non-gated CT examination. Electronically Signed: By: Kreg ShropshirePrice  DeHay M.D. On: 07/05/2020 21:09     LOS: 2 days   Lanae Boastamesh Matyas Baisley, MD [facility]  07/07/2020, 8:50 AM

## 2020-07-07 NOTE — TOC Transition Note (Addendum)
Transition of Care [facility]) - CM/SW Discharge Note   Patient Details  Name: Randy Murphy MRN: [id number] Date of Birth: 1932/03/04  [facility] CM/SW Contact:  Leone Haven, RN Phone Number: 07/07/2020, 3:28 PM   Clinical Narrative:    NCM spoke with wife at bedside, she states she does not want home with hospice, she would like to have palliative services with [facility].  The [facility] rep has been in contact with her.  She states she also has around the clock cna for her spouse thru [facility].  She states she would like to have a [facility] for CHF disease management.  NCM asked MD for order. Wife states he will need ptar transport at discharge. NCM offered choice, she states [facility] will be fine.  NCM made referral to [facility] with [facility].  Awaiting call back to make sure she can take referral.  Per Rosey Bath with [facility] they are able to take referral.  Soc will begin 24 to 48 hrs post dc.   Final next level of care: Home w Home Health Services Barriers to Discharge: Continued Medical Work up   Patient Goals and CMS Choice   CMS Medicare.gov Compare Post Acute Care list provided to:: Patient Represenative (must comment) Choice offered to / list presented to : Adult Children  Discharge Placement                       Discharge Plan and Services                  DME Agency: NA       HH Arranged: RN, Disease Management HH Agency: [facility] Date HH Agency Contacted: 07/07/20 Time HH Agency Contacted: 1528 Representative spoke with at [facility] Agency: Rosey Bath  Social Determinants of Health (SDOH) Interventions     Readmission Risk Interventions No flowsheet data found.

## 2020-07-08 LAB — BASIC METABOLIC PANEL
Anion gap: 12 (ref 5–15)
BUN: 27 mg/dL — ABNORMAL HIGH (ref 8–23)
CO2: 31 mmol/L (ref 22–32)
Calcium: 9.1 mg/dL (ref 8.9–10.3)
Chloride: 98 mmol/L (ref 98–111)
Creatinine, Ser: 1.21 mg/dL (ref 0.61–1.24)
GFR, Estimated: 58 mL/min — ABNORMAL LOW (ref >60)
Glucose, Bld: 116 mg/dL — ABNORMAL HIGH (ref 70–99)
Potassium: 3.7 mmol/L (ref 3.5–5.1)
Sodium: 141 mmol/L (ref 135–145)

## 2020-07-08 MED ORDER — FUROSEMIDE 40 MG PO TABS: 40.0000 mg | ORAL_TABLET | Freq: Every day | ORAL | 0 refills | Status: AC

## 2020-07-08 NOTE — Plan of Care (Signed)
  Problem: Safety: Goal: Ability to remain free from injury will improve Outcome: Progressing   

## 2020-07-08 NOTE — Plan of Care (Signed)

## 2020-07-08 NOTE — Progress Notes (Signed)
Patient alert upon initial assessment this am, oriented to person and place only. Lungs clear throughout and slightly diminished in bilateral bases. Resps even and unlabored. Oxygen on via [place] at 1L only; weaned to room air and is satting 98-99%, so oxygen discontinued at this time. Assisted patient with breakfast and he ate 25%, which is an improvement from yesterday. He was also very talkative today and making jokes, also improved from yesterday. Abdomen soft and nontender, BS active in all quads. Patient is incontinent of bowel and bladder; condom cath in place and pericare given as needed for BM. No edema noted in BLE today and spouse ready to take patient home on palliative. Orders in, discharge planned. Spouse at bedside, able and encouraged to make needs known.

## 2020-07-08 NOTE — Progress Notes (Signed)
D/C instructions given and reviewed with wife. Questions asked and answered but encouraged to call with any further concerns. Tele and IV removed, tolerated well.

## 2020-07-08 NOTE — Evaluation (Signed)
Occupational Therapy Evaluation Patient Details Name: Randy Murphy MRN: [id number] DOB: 07-22-1932 Today's Date: 07/08/2020    History of Present Illness 86 year old male with history of chronic combined systolic and diastolic CHF, CKD stage III, Parkinson's dementia/hearing loss/frequent falls/insomnia, paroxysmal A. Fib- not on anticoagulation due to falls,moderate aortic insufficiency, pulmonary hypertension, sinus bradycardia, hypertension, hyperlipidemia, history of orthostatic hypotension presented with complaint of fast breathing, elevated blood pressure, increased work of breathing over the past few days to week.     Clinical Impression   Pt admitted with the above diagnoses and presents with below problem list. Pt will benefit from continued acute OT to address the below listed deficits and maximize independence with basic ADLs prior to d/c home. At baseline pt feeds self with min A, significant assist for remaining ADLs, rolls in bed with max A +1, Hoyer lift for OOB. Pt currently needing mod-max A with feeding, total A with remaining ADLs. Spouse present throughout session.    Follow Up Recommendations  Home health OT;Supervision/Assistance - 24 hour    Equipment Recommendations  None recommended by OT    Recommendations for Other Services       Precautions / Restrictions Precautions Precautions: Fall Restrictions Weight Bearing Restrictions: No      Mobility Bed Mobility               General bed mobility comments: max-total A     Transfers                 General transfer comment: Hoyer at baseline    Balance                                           ADL either performed or assessed with clinical judgement   ADL Overall ADL's : Needs assistance/impaired Eating/Feeding: Moderate assistance;Bed level   Grooming: Total assistance   Upper Body Bathing: Total assistance   Lower Body Bathing: Total assistance;+2 for physical  assistance;Bed level   Upper Body Dressing : Total assistance;Bed level   Lower Body Dressing: Total assistance;+2 for physical assistance;Bed level                 General ADL Comments: Able to bring full cup close to mouth, assist to bring fully to mouth and stabilize cup to drink     Vision         Perception     Praxis      Pertinent Vitals/Pain Pain Assessment: Faces Faces Pain Scale: Hurts little more Pain Location: generalized Pain Descriptors / Indicators: Aching Pain Intervention(s): Monitored during session;Limited activity within patient's tolerance;Repositioned     Hand Dominance Right   Extremity/Trunk Assessment Upper Extremity Assessment Upper Extremity Assessment: RUE deficits/detail;LUE deficits/detail RUE Deficits / Details: grossly 2/5 LUE Deficits / Details: grossly 2/5   Lower Extremity Assessment Lower Extremity Assessment: Defer to PT evaluation   Cervical / Trunk Assessment Cervical / Trunk Assessment: Kyphotic   Communication Communication Communication: HOH   Cognition Arousal/Alertness: Awake/alert Behavior During Therapy: Flat affect Overall Cognitive Status: History of cognitive impairments - at baseline                                     General Comments       Exercises Exercises: General Upper Extremity;General Lower Extremity General  Exercises - Upper Extremity Shoulder Flexion: AAROM;Both;5 reps;Supine Shoulder Extension: AAROM;5 reps;Both;Supine Elbow Flexion: AROM;Both;10 reps;Supine Elbow Extension: AROM;10 reps;Both Wrist Flexion: Right;5 reps;Supine;AROM Wrist Extension: AROM;Right;5 reps;Supine Digit Composite Flexion: 5 reps;Both;AROM;Supine Composite Extension: AROM;Both;5 reps;Supine General Exercises - Lower Extremity Ankle Circles/Pumps: AROM;Both;10 reps;Supine Hip Flexion/Marching: AAROM;PROM;5 reps;Both;Supine;Other (comment) (hip flexion/extension in supine)   Shoulder Instructions       Home Living Family/patient expects to be discharged to:: Private residence Living Arrangements: Spouse/significant other Available Help at Discharge: Personal care attendant;Available 24 hours/day Type of Home: House Home Access: Ramped entrance     Home Layout: One level     Bathroom Shower/Tub: Chief Strategy Officer: Standard Bathroom Accessibility: Yes How Accessible: Accessible via walker Home Equipment: Walker - 2 wheels;Transport chair;Other (comment);Hospital bed (mechanical lift)          Prior Functioning/Environment Level of Independence: Needs assistance  Gait / Transfers Assistance Needed: Per spouse has not sat EOB in >1 year. Hoyer lift for all OOB transfers.  ADL's / Homemaking Assistance Needed: min A with feeding, significant assist with remaining ADLs Communication / Swallowing Assistance Needed: spouse reports pt has been having more difficulty speaking          OT Problem List: Decreased strength;Decreased activity tolerance;Impaired balance (sitting and/or standing);Decreased range of motion;Decreased knowledge of use of DME or AE;Decreased knowledge of precautions;Cardiopulmonary status limiting activity;Impaired UE functional use;Pain      OT Treatment/Interventions: Self-care/ADL training;Therapeutic exercise;Energy conservation;DME and/or AE instruction;Therapeutic activities;Patient/family education;Balance training    OT Goals(Current goals can be found in the care plan section) Acute Rehab OT Goals Patient Stated Goal: unable to state OT Goal Formulation: With patient/family Time For Goal Achievement: 07/22/20 Potential to Achieve Goals: Fair ADL Goals Pt Will Perform Eating: with min assist;bed level;sitting Pt/caregiver will Perform Home Exercise Program: Increased strength;Increased ROM;Both right and left upper extremity;With minimal assist;With written HEP provided Additional ADL Goal #1: Pt will complete rolling at max A  +1 to each side to facilitate bed level ADLs.  OT Frequency: Min 2X/week   Barriers to D/C:            Co-evaluation              AM-PAC OT "6 Clicks" Daily Activity     Outcome Measure Help from another person eating meals?: A Lot Help from another person taking care of personal grooming?: Total Help from another person toileting, which includes using toliet, bedpan, or urinal?: Total Help from another person bathing (including washing, rinsing, drying)?: Total Help from another person to put on and taking off regular upper body clothing?: Total Help from another person to put on and taking off regular lower body clothing?: Total 6 Click Score: 7   End of Session    Activity Tolerance: Patient tolerated treatment well Patient left: in bed;with call bell/phone within reach;with bed alarm set;with family/visitor present  OT Visit Diagnosis: Other abnormalities of gait and mobility (R26.89);Muscle weakness (generalized) (M62.81);Other symptoms and signs involving cognitive function;Pain                Time: 2637-8588 OT Time Calculation (min): 17 min Charges:  OT General Charges $OT Visit: 1 Visit OT Evaluation $OT Eval Low Complexity: 1 Low  Raynald Kemp, OT Acute Rehabilitation Services Pager: [phone] Office: [phone]   Pilar Grammes 07/08/2020, 11:21 AM

## 2020-07-08 NOTE — Discharge Summary (Signed)
Physician Discharge Summary  Randy Murphy IEP:[id number] DOB: 04-17-1932 DOA: 07/05/2020  PCP: Rodrigo Ran, MD  Admit date: 07/05/2020 Discharge date: 07/08/2020  Admitted From: home Disposition:  HH  Recommendations for Outpatient Follow-up:  1. Follow up with PCP in 1-2 weeks 2. Please obtain BMP/CBC in one week 3. Please follow up on the following pending results: bmp in 1 wk  To reassess lasix dose  Home Health:Yes  Equipment/Devices: none Home o2 if he qualifies  Discharge Condition: Stable Code Status:   Code Status: DNR Diet recommendation:  Diet Order            Diet - low sodium heart healthy           Diet Heart Room service appropriate? Yes; Fluid consistency: Thin; Fluid restriction: 1500 mL Fluid  Diet effective now                  Brief/Interim Summary: 86 year old male with history of chronic combined systolic and diastolic CHF, CKD stage III, Parkinson's dementia/hearing loss/frequent falls/insomnia, paroxysmal A. Fib- not on anticoagulation due to falls, , moderate aortic insufficiency, pulmonary hypertension, sinus bradycardia, hypertension, hyperlipidemia, history of orthostatic hypotension presented with complaint of fast breathing, elevated blood pressure, increased work of breathing over the past few days to week.  As per report patient having slightly increased work of breathing over the past few days to a week before worsening significantly today. His blood pressure was also noted to be elevated to the 180/90 range at home which concerned his family. His wife explains that the typically try to avoid salty foods, but ordered some takeout last night that was very salty. Patient has not been complaining of any chest pain, has not been coughing much, and no fevers or chills have been noted.  In the ED, afebrile, saturating mid to upper 80s on room air, tachypneic, and with stable blood pressure. EKG nsr/chronic LBBB. Cxr-small bilateral pleural effusions  and mild CHF findings.   Troponin slightly positive but flat 39, 46, 52, D-dimer 1.3, subsequently underwent CTA chest:negative for PE but notable for CHF,ascending thoracic aortic dilatation.COVID-19 PCR is negative. Patient was given 40 mg IV Lasix in the ED and was subsequently admitted. Patient was diuresed 6 and overall clinically improving in terms of respiration.  Wife endorsed that he is eating well now appears more alert awake and without any labored breathing.  She prefers to home with oxygen. Seen by palliative care and they voiced interest with palliative care follow-up as outpatient and transition to hospice if needed in the future. I discussed with patient's wife in great detail about avoiding salt for fluid restriction less than 40 ounce and salt restriction less than 2 g daily and close monitoring of his fluid status edema shortness of breath and following up with Dr. Anne Fu from cardiology.  Need reassessment in a week to adjust Lasix dose  Discharge Diagnoses:   Acute on chronic combined systolic and diastolic CHF: Suspected from dietary indiscretion/salty meal.Eho from 2021 March EF 35 to 40%, PT, repeat echocardiogram needs unchanged.  ProBNP-1309>800. Held lasix  11/8  am, repeat bmp with creat a 1.2, continued on  lasix 20 mg iv daily.  Overall clinically improved we will transition to oral Lasix for discharge home with home health PT and follow-up with palliative.   Elevated troponin, flat 39, 46, 52,, no acute ischemic changes on EKG no chest pain suspected type II demand mismatch in the setting of acute CHF.  Supportive measures for  CHF  CKD stage II, BUN/creatinine seems stable at 1.1. monitor, slightly up creatinine 1.3, creatinine improving at 1.21, continue diuretics follow-up with PCP/cardiology.  Recent Labs  Lab 07/05/20 1555 07/06/20 0514 07/07/20 0454 07/07/20 1038 07/08/20 0422  BUN 21 19 26* 24* 27*  CREATININE 1.05 1.11 1.33* 1.28* 1.21   Parkinson's  dementia/hearing loss/frequent falls/insomnia: Patient is alert awake oriented at baseline with dementia we will continue on his home regimen of sinement, Klonopin and Wellbutrin.   Paf:not on anticoagulation due to falls, in NSR.  Monitor in telemetry.  Thoracic ascending aortic aneurysm/Moderate aortic insufficiency/TR/Pulmonary hypertension: Incidentally noted thoracic aortic dilation, will advise annual follow-up with imaging if he is interested but family leaning towards  palliative care or hospice, PCP follow-up.  Hypertension/ history of orthostatic hypotension:  BP stable.Marland Kitchen    BPH  continues Proscar  Goals of care:DNR.  Appreciate palliative care consultation on board.  Hospice palliative care discussion initiated, noted plan for paliative care follow-up initially and transfer to home hospice at a later time as appropriate  Diffuse hypokinesis with abnormal septal motion EF similar to echo  done March 2021 lvef- 35 to 40%. The left ventricle has moderately decreased function. The left  ventricle demonstrates global hypokinesis. The left ventricular internal cavity size was moderately  dilated.    Pressure Ulcer: Pressure Injury 07/06/20 Heel Right Stage 1 -  Intact skin with non-blanchable redness of a localized area usually over a bony prominence. Redness to The R heel and L Ankle (Active)  07/06/20 1830  Location: Heel  Location Orientation: Right  Staging: Stage 1 -  Intact skin with non-blanchable redness of a localized area usually over a bony prominence.  Wound Description (Comments): Redness to The R heel and L Ankle  Present on Admission: Yes     Pressure Injury 07/06/20 Penis Stage 2 -  Partial thickness loss of dermis presenting as a shallow open injury with a red, pink wound bed without slough. Open wound to R. side of penis (Active)  07/06/20 1830  Location: Penis  Location Orientation:   Staging: Stage 2 -  Partial thickness loss of dermis presenting as a  shallow open injury with a red, pink wound bed without slough.  Wound Description (Comments): Open wound to R. side of penis  Present on Admission: Yes    Consults:  TOC, PT/OT  Subjective: Aaox2, resting on 2l [place], wife at bedside., eager to go home today  Discharge Exam: Vitals:   07/07/20 1219 07/08/20 0442  BP: 126/61 130/77  Pulse: 72 66  Resp: 19 18  Temp: 99.2 F (37.3 C) 98.2 F (36.8 C)  SpO2: 97% 98%   General: Pt is alert, awake, not in acute distress Cardiovascular: RRR, S1/S2 +, no rubs, no gallops Respiratory: CTA bilaterally, no wheezing, no rhonchi Abdominal: Soft, NT, ND, bowel sounds + Extremities: no edema, no cyanosis  Discharge Instructions  Discharge Instructions    Diet - low sodium heart healthy   Complete by: As directed    Discharge instructions   Complete by: As directed    Please follow-up with cardiology.  Check weight daily. Restrict fluid intake less than 1200 ml  or 40 ounce daily and salt intake to <2 g mdaily. Call MD:  Anytime you have any of the following symptoms: 1) 3 pound weight gain in 24 hours or 5 pounds in 1 week 2) shortness of breath, with or without a dry hacking cough 3) swelling in the hands, feet or stomach  4) if you have to sleep on extra pillows at night in order to breathe  Please call call MD or return to ER for similar or worsening recurring problem that brought you to hospital or if any fever,nausea/vomiting,abdominal pain, uncontrolled pain, chest pain,  shortness of breath or any other alarming symptoms.  Please follow-up your doctor as instructed in a week time and call the office for appointment.  Please avoid alcohol, smoking, or any other illicit substance and maintain healthy habits including taking your regular medications as prescribed.  You were cared for by a hospitalist during your hospital stay. If you have any questions about your discharge medications or the care you received while you were in the  hospital after you are discharged, you can call the unit and ask to speak with the hospitalist on call if the hospitalist that took care of you is not available.  Once you are discharged, your primary care physician will handle any further medical issues. Please note that NO REFILLS for any discharge medications will be authorized once you are discharged, as it is imperative that you return to your primary care physician (or establish a relationship with a primary care physician if you do not have one) for your aftercare needs so that they can reassess your need for medications and monitor your lab values   For home use only DME oxygen   Complete by: As directed    Length of Need: Lifetime   Mode or (Route): Nasal cannula   Liters per Minute: 2   Frequency: Continuous (stationary and portable oxygen unit needed)   Oxygen delivery system: Gas   Increase activity slowly   Complete by: As directed      Allergies as of 07/08/2020   No Known Allergies     Medication List    TAKE these medications   AMBULATORY NON FORMULARY MEDICATION Lift Chair DX: G20   buPROPion 100 MG 12 hr tablet Commonly known as: WELLBUTRIN SR Take 100 mg by mouth daily.   carbidopa-levodopa 50-200 MG tablet Commonly known as: SINEMET CR TAKE 1 TABLET BY MOUTH AT  BEDTIME What changed: Another medication with the same name was changed. Make sure you understand how and when to take each.   Carbidopa-Levodopa ER 25-100 MG tablet controlled release Commonly known as: SINEMET CR Take 2 tabs in the morning; 2 tabs in the afternoon and 1 tab in the evening What changed:   how much to take  how to take this  when to take this   chlorhexidine 0.12 % solution Commonly known as: PERIDEX 15 mLs by Mouth Rinse route at bedtime. Rinse and spit   cholecalciferol 25 MCG (1000 UNIT) tablet Commonly known as: VITAMIN D3 Take 1,000 Units by mouth daily.   clonazePAM 0.5 MG tablet Commonly known as: KLONOPIN Take 1  tablet (0.5 mg total) by mouth at bedtime.   Co-Enzyme Q-10 100 MG Caps Take 200 mg by mouth at bedtime.   finasteride 5 MG tablet Commonly known as: PROSCAR Take 5 mg by mouth daily.   furosemide 40 MG tablet Commonly known as: Lasix Take 1 tablet (40 mg total) by mouth daily. After a week reassess tocut down to 20 mg daily.   ibandronate 150 MG tablet Commonly known as: BONIVA Take 150 mg by mouth every 30 (thirty) days. Take in the morning with a full glass of water, on an empty stomach, and do not take anything else by mouth or lie down for the next  30 min.   multivitamin with minerals Tabs tablet Take 1 tablet by mouth daily.   Nuplazid 34 MG Caps Generic drug: Pimavanserin Tartrate Take 1 capsule (34 mg total) by mouth daily.   PRESERVISION AREDS 2 PO Take 1 capsule by mouth 2 (two) times daily.   PROBIOTIC-10 PO Take 1 tablet by mouth daily.   psyllium 58.6 % powder Commonly known as: METAMUCIL Take 1 packet by mouth daily as needed (fiber).            Durable Medical Equipment  (From admission, onward)         Start     Ordered   07/08/20 0000  For home use only DME oxygen       Question Answer Comment  Length of Need Lifetime   Mode or (Route) Nasal cannula   Liters per Minute 2   Frequency Continuous (stationary and portable oxygen unit needed)   Oxygen delivery system Gas      07/08/20 0959          Follow-up Information    [facility], [facility] Follow up.   Specialty: [facility] Contact information: [address] [place] [address] [phone]        [name], Mark, MD In 1 week.   Specialty: Internal Medicine Why: The office will call Contact information: [address] [place] [address] [phone]        Jake BatheSkains, Mark C, MD.   Specialty: Cardiology Contact information: [phone] [address] [place] [address] [phone]              No Known Allergies  The results  of significant diagnostics from this hospitalization (including imaging, microbiology, ancillary and laboratory) are listed below for reference.    Microbiology: Recent Results (from the past 240 hour(s))  Respiratory Panel by RT PCR (Flu A&B, Covid) - Nasopharyngeal Swab     Status: None   Collection Time: 07/05/20  6:00 PM   Specimen: Nasopharyngeal Swab  Result Value Ref Range Status   SARS Coronavirus 2 by RT PCR NEGATIVE NEGATIVE Final    Comment: (NOTE) SARS-CoV-2 target nucleic acids are NOT DETECTED.  The SARS-CoV-2 RNA is generally detectable in upper respiratoy specimens during the acute phase of infection. The lowest concentration of SARS-CoV-2 viral copies this assay can detect is 131 copies/mL. A negative result does not preclude SARS-Cov-2 infection and should not be used as the sole basis for treatment or other patient management decisions. A negative result may occur with  improper specimen collection/handling, submission of specimen other than nasopharyngeal swab, presence of viral mutation(s) within the areas targeted by this assay, and inadequate number of viral copies (<131 copies/mL). A negative result must be combined with clinical observations, patient history, and epidemiological information. The expected result is Negative.  Fact Sheet for Patients:  https://www.moore.com/https://www.fda.gov/media/142436/download  Fact Sheet for Healthcare Providers:  https://www.young.biz/https://www.fda.gov/media/142435/download  This test is no t yet approved or cleared by the [place] FDA and  has been authorized for detection and/or diagnosis of SARS-CoV-2 by FDA under an Emergency Use Authorization (EUA). This EUA will remain  in effect (meaning this test can be used) for the duration of the COVID-19 declaration under Section 564(b)(1) of the Act, 21 U.S.C. section 360bbb-3(b)(1), unless the authorization is terminated or revoked sooner.     Influenza A by PCR NEGATIVE NEGATIVE Final   Influenza B by  PCR NEGATIVE NEGATIVE Final    Comment: (NOTE)  The Xpert Xpress SARS-CoV-2/FLU/RSV assay is intended as an aid in  the diagnosis of influenza from Nasopharyngeal swab specimens and  should not be used as a sole basis for treatment. Nasal washings and  aspirates are unacceptable for Xpert Xpress SARS-CoV-2/FLU/RSV  testing.  Fact Sheet for Patients: https://www.moore.com/  Fact Sheet for Healthcare Providers: https://www.young.biz/  This test is not yet approved or cleared by the [place] FDA and  has been authorized for detection and/or diagnosis of SARS-CoV-2 by  FDA under an Emergency Use Authorization (EUA). This EUA will remain  in effect (meaning this test can be used) for the duration of the  Covid-19 declaration under Section 564(b)(1) of the Act, 21  U.S.C. section 360bbb-3(b)(1), unless the authorization is  terminated or revoked. Performed at [facility], [address], [place], [place] [address]     Procedures/Studies: DG Chest 2 View  Result Date: 07/05/2020 CLINICAL DATA:  Dyspnea, tachypnea, hypertension, dementia EXAM: CHEST - 2 VIEW COMPARISON:  11/13/2019 chest radiograph. FINDINGS: Stable cardiomediastinal silhouette with mild cardiomegaly. No pneumothorax. Small bilateral pleural effusions, left greater than right. Mild diffuse prominence of the parahilar interstitial markings. Mild hazy bibasilar lung opacities, favor atelectasis. IMPRESSION: 1. Small bilateral pleural effusions, left greater than right. 2. Mild congestive heart failure. Electronically Signed   By: Delbert Phenix M.D.   On: 07/05/2020 17:00   CT Angio Chest PE W and/or Wo Contrast  Addendum Date: 07/05/2020   ADDENDUM REPORT: 07/05/2020 21:47 ADDENDUM: Patient history: Tachypnic with clammy complexion, elevated D-Dimer Additional impression and follow-up recommendation: Ascending thoracic aortic dilatation to 4.2 cm. Recommend annual imaging followup  by CTA or MRA. This recommendation follows 2010 ACCF/AHA/AATS/ACR/ASA/SCA/SCAI/SIR/STS/SVM Guidelines for the Diagnosis and Management of Patients with Thoracic Aortic Disease. Circulation. 2010; 121: U765-Y650. Aortic aneurysm NOS (ICD10-I71.9) Electronically Signed   By: Kreg Shropshire M.D.   On: 07/05/2020 21:47   Result Date: 07/05/2020 CLINICAL DATA:  CT neck, clammy complexion, elevated D-dimer EXAM: CT ANGIOGRAPHY CHEST WITH CONTRAST TECHNIQUE: Multidetector CT imaging of the chest was performed using the standard protocol during bolus administration of intravenous contrast. Multiplanar CT image reconstructions and MIPs were obtained to evaluate the vascular anatomy. CONTRAST:  46mL OMNIPAQUE IOHEXOL 350 MG/ML SOLN COMPARISON:  Radiograph 07/05/2020, CT abdomen pelvis 09/08/2018 FINDINGS: Cardiovascular: Satisfactory opacification the pulmonary arteries to the segmental level. No pulmonary artery filling defects are identified. Central pulmonary arteries are borderline enlarged. Mild cardiomegaly some right heart enlargement. Coronary artery calcifications are present. No pericardial effusion. Fluid in the pericardial recesses is likely within physiologic normal. The aortic root is suboptimally assessed given cardiac pulsation artifact. Ascending aorta measures up to 4.2 cm in size returning to a more normal caliber 3 cm by the distal arch. Atherosclerotic plaque within the aorta. No acute luminal abnormality of the imaged aorta. No periaortic stranding or hemorrhage. Normal 3 vessel branching of the aortic arch. Proximal great vessels are tortuous but free of acute abnormality. No major venous abnormalities. Mediastinum/Nodes: No mediastinal fluid or gas. Normal thyroid gland and thoracic inlet. No acute abnormality of the trachea or esophagus. Scattered low-attenuation mediastinal hilar nodes present largest including a 13 mm precarinal lymph node (5/57) and a 13 mm AP window lymph node (5/56). These are  fairly low attenuation may be reactive or edematous. Lungs/Pleura: Lung volumes are low with atelectasis likely accentuated by imaging during exhalation. There is significant redistribution of the pulmonary vascularity with bronchial cuffing, fissural and septal thickening, and perihilar and dependent predominant  hazy ground-glass opacity most suggestive of pulmonary edema with some small to moderate bilateral effusions and passive atelectatic changes. Some more bandlike opacities towards the lung bases could reflect additional subsegmental atelectasis or scarring. No pneumothorax. Upper Abdomen: Redemonstration of a cystic lesion measuring up to 14 mm in the dome of the liver. Some slightly increased attenuation may be related to the adjacent dense contrast bolus. (5/115). No other liver abnormalities are seen. Gallbladder mildly distended with layering partially calcified gallstones and some gradient attenuation, possibly biliary sludge. No pericholecystic fluid or inflammation or biliary ductal dilatation is seen. Mild symmetric bilateral perinephric stranding, a nonspecific finding which may correlate with advanced age or decreased renal function the correlate for urinary symptoms. Musculoskeletal: Multilevel degenerative changes are present in the imaged portions of the spine. Levocurvature in exaggerated thoracic kyphosis with some associated chest wall deformity. Additional degenerative changes in the shoulders. No acute or worrisome osseous abnormality. Body wall edema most pronounced over the right flank and chest wall. No other acute or focal soft tissue abnormality. Review of the MIP images confirms the above findings. IMPRESSION: 1. No evidence of acute pulmonary artery embolism. 2. Features of congestive heart failure with cardiomegaly, redistribution of the pulmonary vascularity, pulmonary edema, and small to moderate bilateral effusions with adjacent passive atelectatic changes. 3. Right heart  enlargement and central pulmonary arterial enlargement may reflect some chronic pulmonary artery hypertension/right heart dysfunction. 4. Scattered low-attenuation borderline enlarged mediastinal and hilar nodes, may be reactive or edematous. 5. Cholelithiasis and likely biliary sludge without CT evidence of acute cholecystitis. 6. Mild symmetric bilateral perinephric stranding, a nonspecific finding which may correlate with advanced age or decreased renal function the correlate for urinary symptoms. 7. Aortic Atherosclerosis (ICD10-I70.0). 8. Coronary artery calcifications are present. Please note that the presence of coronary artery calcium documents the presence of coronary artery disease, the severity of this disease and any potential stenosis cannot be assessed on this non-gated CT examination. Electronically Signed: By: Kreg Shropshire M.D. On: 07/05/2020 21:09   ECHOCARDIOGRAM LIMITED  Result Date: 07/07/2020    ECHOCARDIOGRAM LIMITED REPORT   Patient Name:   DELMOS VELAQUEZ Date of Exam: 07/07/2020 Medical Rec #:  [id number]      Height:       72.0 in Accession #:    [id number]     Weight:       185.9 lb Date of Birth:  March 31, 1932       BSA:          2.065 m Patient Age:    86 years       BP:           126/61 mmHg Patient Gender: M              HR:           72 bpm. Exam Location:  Inpatient Procedure: Cardiac Doppler, Limited Echo and Limited Color Doppler Indications:    I50.22 Chronic systolic (congestive) heart failure  History:        Patient has prior history of Echocardiogram examinations, most                 recent 11/13/2019. Pulmonary HTN, Arrythmias:Atrial Fibrillation;                 Risk Factors:Hypertension and Dyslipidemia. CKD. Parkinson's                 Disease.  Sonographer:    Tiffany Dance Referring Phys: [id number] Shakia Sebastiano  Raffaele Derise IMPRESSIONS  1. Diffuse hypokinesis with abnormal septal motion EF similar to echo done March 2021 . Left ventricular ejection fraction, by estimation, is 35 to 40%. The  left ventricle has moderately decreased function. The left ventricle demonstrates global hypokinesis. The left ventricular internal cavity size was moderately dilated.  2. Right ventricular systolic function is normal. The right ventricular size is normal. There is normal pulmonary artery systolic pressure.  3. Left atrial size was moderately dilated.  4. The mitral valve is normal in structure. Mild mitral valve regurgitation. No evidence of mitral stenosis.  5. The aortic valve was not well visualized. Aortic valve regurgitation is mild. No aortic stenosis is present.  6. Aortic dilatation noted. There is mild dilatation of the aortic root, measuring 39 mm.  7. The inferior vena cava is normal in size with greater than 50% respiratory variability, suggesting right atrial pressure of 3 mmHg. FINDINGS  Left Ventricle: Diffuse hypokinesis with abnormal septal motion EF similar to echo done March 2021. Left ventricular ejection fraction, by estimation, is 35 to 40%. The left ventricle has moderately decreased function. The left ventricle demonstrates global hypokinesis. The left ventricular internal cavity size was moderately dilated. There is no left ventricular hypertrophy. Right Ventricle: The right ventricular size is normal. No increase in right ventricular wall thickness. Right ventricular systolic function is normal. There is normal pulmonary artery systolic pressure. The tricuspid regurgitant velocity is 2.62 m/s, and  with an assumed right atrial pressure of 3 mmHg, the estimated right ventricular systolic pressure is 30.5 mmHg. Left Atrium: Left atrial size was moderately dilated. Right Atrium: Right atrial size was normal in size. Pericardium: There is no evidence of pericardial effusion. Mitral Valve: The mitral valve is normal in structure. There is mild thickening of the mitral valve leaflet(s). There is mild calcification of the mitral valve leaflet(s). Mild mitral annular calcification. Mild mitral  valve regurgitation. No evidence of  mitral valve stenosis. Tricuspid Valve: The tricuspid valve is normal in structure. Tricuspid valve regurgitation is not demonstrated. No evidence of tricuspid stenosis. Aortic Valve: The aortic valve was not well visualized. Aortic valve regurgitation is mild. Aortic regurgitation PHT measures 631 msec. No aortic stenosis is present. Pulmonic Valve: The pulmonic valve was normal in structure. Pulmonic valve regurgitation is not visualized. No evidence of pulmonic stenosis. Aorta: The aortic root is normal in size and structure and aortic dilatation noted. There is mild dilatation of the aortic root, measuring 39 mm. Venous: The inferior vena cava is normal in size with greater than 50% respiratory variability, suggesting right atrial pressure of 3 mmHg. IAS/Shunts: No atrial level shunt detected by color flow Doppler. LEFT VENTRICLE PLAX 2D LVIDd:         5.73 cm LVIDs:         4.43 cm LV PW:         1.36 cm LV IVS:        1.08 cm LVOT diam:     2.40 cm LV SV:         51 LV SV Index:   25 LVOT Area:     4.52 cm  RIGHT VENTRICLE          IVC RV Basal diam:  2.55 cm  IVC diam: 1.16 cm TAPSE (M-mode): 1.8 cm LEFT ATRIUM         Index      RIGHT ATRIUM           Index LA diam:  4.40 cm 2.13 cm/m RA Area:     13.10 cm                                RA Volume:   27.70 ml  13.41 ml/m  AORTIC VALVE LVOT Vmax:   57.80 cm/s LVOT Vmean:  37.850 cm/s LVOT VTI:    0.112 m AI PHT:      631 msec  AORTA Ao Root diam: 3.90 cm Ao Asc diam:  3.60 cm MITRAL VALVE               TRICUSPID VALVE MV Area (PHT): 2.83 cm    TR Peak grad:   27.5 mmHg MV Decel Time: 268 msec    TR Vmax:        262.00 cm/s MV E velocity: 50.00 cm/s MV A velocity: 60.60 cm/s  SHUNTS MV E/A ratio:  0.83        Systemic VTI:  0.11 m                            Systemic Diam: 2.40 cm Charlton Haws MD Electronically signed by Charlton Haws MD Signature Date/Time: 07/07/2020/1:20:22 PM    Final     Labs: BNP (last 3  results) Recent Labs    11/13/19 0800 07/06/20 0514 07/07/20 0454  BNP 180.8* 1,309.9* 821.4*   Basic Metabolic Panel: Recent Labs  Lab 07/05/20 1555 07/06/20 0514 07/07/20 0454 07/07/20 1038 07/08/20 0422  NA 139 139 142 142 141  K 4.2 3.8 3.6 3.5 3.7  CL 103 102 101 99 98  CO2 27 26 29  32 31  GLUCOSE 115* 101* 102* 125* 116*  BUN 21 19 26* 24* 27*  CREATININE 1.05 1.11 1.33* 1.28* 1.21  CALCIUM 9.1 8.7* 9.0 9.1 9.1   Liver Function Tests: No results for input(s): AST, ALT, ALKPHOS, BILITOT, PROT, ALBUMIN in the last 168 hours. No results for input(s): LIPASE, AMYLASE in the last 168 hours. No results for input(s): AMMONIA in the last 168 hours. CBC: Recent Labs  Lab 07/05/20 1555 07/06/20 0514 07/07/20 0454  WBC 9.9 7.7 8.7  HGB 15.6 14.4 15.0  HCT 48.5 45.4 46.8  MCV 99.2 97.8 97.3  PLT 224 221 234   Cardiac Enzymes: No results for input(s): CKTOTAL, CKMB, CKMBINDEX, TROPONINI in the last 168 hours. BNP: Invalid input(s): POCBNP CBG: No results for input(s): GLUCAP in the last 168 hours. D-Dimer Recent Labs    07/05/20 1757  DDIMER 1.30*   Hgb A1c No results for input(s): HGBA1C in the last 72 hours. Lipid Profile No results for input(s): CHOL, HDL, LDLCALC, TRIG, CHOLHDL, LDLDIRECT in the last 72 hours. Thyroid function studies No results for input(s): TSH, T4TOTAL, T3FREE, THYROIDAB in the last 72 hours.  Invalid input(s): FREET3 Anemia work up No results for input(s): VITAMINB12, FOLATE, FERRITIN, TIBC, IRON, RETICCTPCT in the last 72 hours. Urinalysis    Component Value Date/Time   COLORURINE YELLOW 11/13/2019 1010   APPEARANCEUR HAZY (A) 11/13/2019 1010   LABSPEC 1.021 11/13/2019 1010   PHURINE 5.0 11/13/2019 1010   GLUCOSEU NEGATIVE 11/13/2019 1010   HGBUR NEGATIVE 11/13/2019 1010   BILIRUBINUR NEGATIVE 11/13/2019 1010   KETONESUR 5 (A) 11/13/2019 1010   PROTEINUR NEGATIVE 11/13/2019 1010   NITRITE NEGATIVE 11/13/2019 1010    LEUKOCYTESUR NEGATIVE 11/13/2019 1010   Sepsis Labs Invalid input(s): PROCALCITONIN,  WBC,  LACTICIDVEN Microbiology Recent Results (from the past 240 hour(s))  Respiratory Panel by RT PCR (Flu A&B, Covid) - Nasopharyngeal Swab     Status: None   Collection Time: 07/05/20  6:00 PM   Specimen: Nasopharyngeal Swab  Result Value Ref Range Status   SARS Coronavirus 2 by RT PCR NEGATIVE NEGATIVE Final    Comment: (NOTE) SARS-CoV-2 target nucleic acids are NOT DETECTED.  The SARS-CoV-2 RNA is generally detectable in upper respiratoy specimens during the acute phase of infection. The lowest concentration of SARS-CoV-2 viral copies this assay can detect is 131 copies/mL. A negative result does not preclude SARS-Cov-2 infection and should not be used as the sole basis for treatment or other patient management decisions. A negative result may occur with  improper specimen collection/handling, submission of specimen other than nasopharyngeal swab, presence of viral mutation(s) within the areas targeted by this assay, and inadequate number of viral copies (<131 copies/mL). A negative result must be combined with clinical observations, patient history, and epidemiological information. The expected result is Negative.  Fact Sheet for Patients:  https://www.moore.com/  Fact Sheet for Healthcare Providers:  https://www.young.biz/  This test is no t yet approved or cleared by the [place] FDA and  has been authorized for detection and/or diagnosis of SARS-CoV-2 by FDA under an Emergency Use Authorization (EUA). This EUA will remain  in effect (meaning this test can be used) for the duration of the COVID-19 declaration under Section 564(b)(1) of the Act, 21 U.S.C. section 360bbb-3(b)(1), unless the authorization is terminated or revoked sooner.     Influenza A by PCR NEGATIVE NEGATIVE Final   Influenza B by PCR NEGATIVE NEGATIVE Final    Comment:  (NOTE) The Xpert Xpress SARS-CoV-2/FLU/RSV assay is intended as an aid in  the diagnosis of influenza from Nasopharyngeal swab specimens and  should not be used as a sole basis for treatment. Nasal washings and  aspirates are unacceptable for Xpert Xpress SARS-CoV-2/FLU/RSV  testing.  Fact Sheet for Patients: https://www.moore.com/  Fact Sheet for Healthcare Providers: https://www.young.biz/  This test is not yet approved or cleared by the [place] FDA and  has been authorized for detection and/or diagnosis of SARS-CoV-2 by  FDA under an Emergency Use Authorization (EUA). This EUA will remain  in effect (meaning this test can be used) for the duration of the  Covid-19 declaration under Section 564(b)(1) of the Act, 21  U.S.C. section 360bbb-3(b)(1), unless the authorization is  terminated or revoked. Performed at [facility], [address], [place], [place] [address]      Time coordinating discharge: 35 minutes  SIGNED: Lanae Boast, MD  [facility] 07/08/2020, 11:14 AM  If 7PM-7AM, please contact night-coverage [email or web address]

## 2020-07-09 ENCOUNTER — Telehealth: Payer: Self-pay | Admitting: Nurse Practitioner

## 2020-07-10 ENCOUNTER — Telehealth: Payer: Self-pay | Admitting: Nurse Practitioner

## 2020-07-10 NOTE — Telephone Encounter (Signed)
Scheduled [facility] Palliative visit for 07-11-20 at 11:30.

## 2020-07-11 ENCOUNTER — Other Ambulatory Visit: Payer: Medicare Other | Admitting: Nurse Practitioner

## 2020-07-11 ENCOUNTER — Other Ambulatory Visit: Payer: Self-pay

## 2020-07-11 DIAGNOSIS — Z515 Encounter for palliative care: Secondary | ICD-10-CM

## 2020-07-11 DIAGNOSIS — R630 Anorexia: Secondary | ICD-10-CM

## 2020-07-11 NOTE — Progress Notes (Signed)
[facility] Consult Note Telephone: [phone]  Fax: [phone]  PATIENT NAME: Randy Murphy [address] [place] [address] [phone] (home)  DOB: 08/03/1932 MRN: [id number]  PRIMARY CARE PROVIDER:    Crist Infante, MD,  [place] [address] [phone]  REFERRING PROVIDER:   Crist Infante, MD [address] [place],  [place] [address] [phone]  RESPONSIBLE PARTY:   Extended Emergency Contact Information Primary Emergency Contact: Toothman,[name] Address: [place] [place], [place] [address] Johnnette Litter of [place] Phone: [phone] Mobile Phone: [phone] Relation: Spouse Secondary Emergency Contact: Pauletta Browns Mobile Phone: [phone] Relation: Friend  I met face to face with patient and wife Chrys Racer in home.   ASSESSMENT AND RECOMMENDATIONS:   1. Advance Care Planning: Visit at the request of Dr. Crist Infante for Palliative care consult. Palliative Care was asked to help address advance care planning and goals of care. This is an initial visit.  Today's visit consisted of building trust and discussions on Palliative care medicine as a specialized medical care for people living with serious illness, aimed at facilitating improved quality of life through symptoms relief, assisting with advance care planning and establishing goals of care. Patient unable to substatntialy participate in the discussion due to sleepiness. Wife Chrys Racer expressed appreciation for education provided on Palliative care and how it differs from Hospice care. Palliative care will continue to provide support to patient, family and the medical team. Goal of care: Patient's goal of care is comfort. Chrys Racer desires for patient to be comfortable, stay well, and enjoy the rest of his life. family would like pateint to stay in his home. We discussed criteria for Hospice admission to include  increased somnolence, confusion, decreased oral intake, weight loss, inability to ambulate, or acute illness without plans for hospital evaluation for work up and acute intervention. Chrys Racer verbalized understanding the criteria, and understands that patient meet the criteria, she however decided against Hospice care for patient at this time. She want to think about it and call me with family's decision or we will revisit the discussion when family is ready. Patient verbalized desire for hospitalization if needed. Chrys Racer does not want hospitalization for patient but would consider it if it will prolong patient's life. Directives: Patient has a living will, his code status is DNR. Chrys Racer reiterated that patient would not want to be resuscitated in the event of cardiac or respiratory arrest. Patient does not have a DNR form at home, Chrys Racer said ambulance took it with patient during the last hospitalization, was not returned. New DNR form signed for patient today, form given to [place] to keep at home. MOST form discussed, sections of it discussed in detail, opportunity given for questions, all questions answered. Chrys Racer initially selected feeding tube for a defined trial period, she was unsure of what patient would want as she has never discussed it with him, she woke patient up and asked him, patient told us that he does not want a feeding tube. MOST form was completed and signed with Chrys Racer, form given to her to keep in home. Details of MOST include: DNR/NI, limited additional intervention, determine use and limitation of antibiotics, IV fluids for defined trial period, no feeding tube. Copies of the signed DNR and MOST form uploaded to Epic EMR.  2. Symptom Management:  Poor appetite: Chrys Racer report patient meal intake to be about 50%. Chrys Racer report poor appetite was ongoing  before last hospitalization last week. Patient also having difficulty adjusting to a low salt diet, has used salt  substitute without success. Wife has tried using herbs and other food flavoring to enhance taste of his meals.  Recommendation: Patient may benefit from an apetite stimulant like Megace or low dose Mirtazapine. Advised Chrys Racer to search for low salt meal recipes on internet or cook books. I would search for meal ideas and e-mail it to [place].   3. Follow up Palliative Care Visit: Palliative care will continue to follow for goals of care clarification and symptom management. Return in about 4 weeks or prn. My business card left with Chrys Racer to call for questions or concerns.  4. Family /Caregiver/Community Supports: Patient lives at home with wife, they have one child a son and 2 grandchildren and 2 great grand children. Patient enjoys watching football games, his care givers takes him outside his home when the weather permits.  5. Cognitive / Functional decline: Chrys Racer report increased sleepiness, at times sleeping through meal times. Patient is total care for all his ADLs, including feeding, wife report he is able to brush his teeth at times. Patient is non-ambulatory, wheelchair dependent, uses a [employer] fast streamer for transfers. Requires propping up with pillows to sit upright.  I spent 90 minutes providing this consultation, time includes time spent with patient ans wife, chart review, provider coordination, and documentation. More than 50% of the time in this consultation was spent coordinating communication.   HISTORY OF PRESENT ILLNESS:  Randy Murphy is a 86 y.o. year old male with multiple medical problems including Parkinson's disease, chronic combined systoic and diastolic CHF (EF 05-69% in March 2021), CKD, 3, dementia (FAST 6e), paroxysmal A-fib (not on anticoagulation due to falls), moderate aortic insufficiency, pulmonary hypertension, sinus brady cardia, HTN, HLD. Patient was Hospitalized from 07/05/2020 to 07/08/2020 for CHF exercabation where he was diuresed 6L.   CODE STATUS: DNR  PPS:  30%  HOSPICE ELIGIBILITY/DIAGNOSIS: TBD  PHYSICAL EXAM / ROS:   Current and past weights: 174lbs, Ht 63f11", BMI 24.3kg/m2 General:  frail appearing, sitting in a wheelchair in NAD Cardiovascular: no report of chest pain, no edema, S1S2 normal  Pulmonary: no cough, no increased SOB, room air GI:  appetite poor, no report of constipation, incontinent of bowel GU: denies dysuria, incontinent of urine MSK:  no joint and ROM abnormalities, non-ambulatory Skin: no rashes or wounds reported or noted on exposed skin Neurological: Weakness, but otherwise nonfocal  PAST MEDICAL HISTORY:  Past Medical History:  Diagnosis Date  . Chronic combined systolic and diastolic CHF (congestive heart failure) ([facility]   . CKD (chronic kidney disease), stage III ([facility]    a. stage II-III by labs, Cr 1.1-1.22 in 09/2017, 1.3-1.4 in 06/2018.  .Marland KitchenDementia ([facility]   . Fatigue   . Frequent falls   . Hearing loss of both ears   . History of orthostatic hypotension   . Hyperlipidemia   . Hypersomnia, periodic 02/18/2014  . Hypertension   . Insomnia   . Junctional bradycardia   . Moderate aortic insufficiency   . Osteopenia   . PAF (paroxysmal atrial fibrillation) ([facility]    a. noted in hospital 06/2018, not on anticoag due to h/o frequent falling.  . Parkinson's disease    Possible early parkinsons disease  . Pulmonary hypertension ([facility]   . Sinus bradycardia   . Tricuspid regurgitation     SOCIAL HX:  Social History   Tobacco Use  . Smoking status: Never  Smoker  . Smokeless tobacco: Never Used  Substance Use Topics  . Alcohol use: No   FAMILY HX:  Family History  Problem Relation Age of Onset  . Stroke Sister    ALLERGIES: No Known Allergies   PERTINENT MEDICATIONS:  Outpatient Encounter Medications as of 07/11/2020  Medication Sig  . AMBULATORY NON FORMULARY MEDICATION Lift Chair DX: G20  . buPROPion (WELLBUTRIN SR) 100 MG 12 hr tablet Take 100 mg by mouth daily.  . carbidopa-levodopa  (SINEMET CR) 50-200 MG tablet TAKE 1 TABLET BY MOUTH AT  BEDTIME  . Carbidopa-Levodopa ER (SINEMET CR) 25-100 MG tablet controlled release Take 2 tabs in the morning; 2 tabs in the afternoon and 1 tab in the evening (Patient taking differently: Take 1-2 tablets by mouth See admin instructions. Take 2 tabs in the morning; 2 tabs in the afternoon and 1 tab in the evening)  . chlorhexidine (PERIDEX) 0.12 % solution 15 mLs by Mouth Rinse route at bedtime. Rinse and spit  . cholecalciferol (VITAMIN D3) 25 MCG (1000 UT) tablet Take 1,000 Units by mouth daily.  . clonazePAM (KLONOPIN) 0.5 MG tablet Take 1 tablet (0.5 mg total) by mouth at bedtime.  Marland Kitchen Co-Enzyme Q-10 100 MG CAPS Take 200 mg by mouth at bedtime.   . finasteride (PROSCAR) 5 MG tablet Take 5 mg by mouth daily.    . furosemide (LASIX) 40 MG tablet Take 1 tablet (40 mg total) by mouth daily. After a week reassess tocut down to 20 mg daily.  Marland Kitchen ibandronate (BONIVA) 150 MG tablet Take 150 mg by mouth every 30 (thirty) days. Take in the morning with a full glass of water, on an empty stomach, and do not take anything else by mouth or lie down for the next 30 min.  . Multiple Vitamin (MULTIVITAMIN WITH MINERALS) TABS tablet Take 1 tablet by mouth daily.  . Multiple Vitamins-Minerals (PRESERVISION AREDS 2 PO) Take 1 capsule by mouth 2 (two) times daily.  Debby Freiberg Tartrate (NUPLAZID) 34 MG CAPS Take 1 capsule (34 mg total) by mouth daily.  . Probiotic Product (PROBIOTIC-10 PO) Take 1 tablet by mouth daily.  . psyllium (METAMUCIL) 58.6 % powder Take 1 packet by mouth daily as needed (fiber).   No facility-administered encounter medications on file as of 07/11/2020.     Jari Favre, DNP, AGPCNP-BC

## 2020-07-14 ENCOUNTER — Other Ambulatory Visit: Payer: Self-pay

## 2020-07-14 ENCOUNTER — Other Ambulatory Visit: Payer: Medicare Other | Admitting: Nurse Practitioner

## 2020-07-14 DIAGNOSIS — T7840XA Allergy, unspecified, initial encounter: Secondary | ICD-10-CM

## 2020-07-14 DIAGNOSIS — Z515 Encounter for palliative care: Secondary | ICD-10-CM

## 2020-07-14 DIAGNOSIS — R21 Rash and other nonspecific skin eruption: Secondary | ICD-10-CM

## 2020-07-14 NOTE — Progress Notes (Signed)
[facility] Consult Note Telephone: [phone]  Fax: [phone]  PATIENT NAME: Randy Murphy [address] [place] [address] [phone] (home)  DOB: [birth year] MRN: [id number]  PRIMARY CARE PROVIDER:    Crist Infante, MD,  [place] [address] [phone]  REFERRING PROVIDER:   Crist Infante, MD [address] [place],  [place] [address] [phone]  RESPONSIBLE PARTY:   Extended Emergency Contact Information Primary Emergency Contact: Burrough,[name] Address: [place] [place], [place] [address] Johnnette Litter of [place] Phone: [phone] Mobile Phone: [phone] Relation: Spouse Secondary Emergency Contact: Pauletta Browns Mobile Phone: [phone] Relation: Friend  I met face to face with patient and family in home.   ASSESSMENT AND RECOMMENDATIONS:   1. Advance Care Planning: Goal of care: Goal of care is comfort and for patient to remain in his home at end of life. Directives: Signed DNR and MOST form in home and on [facility] EMR. Details of MOST include: DNR/NI, limited additional intervention, determine use and limitation of antibiotics, IV fluids for defined trial period, no feeding tube.   2. Symptom Management: Wife called reporting patient with rash that was noted this morning. Patient has 24hr/7 days private care givers. Night time care giver noted the rash during personal care this morning. No report of pruritus. Wife denied any new food for patient, no change in his personal care products. No report of fever, chills, SOB, nausea or vomiting. Wife has administered over the counter Benadrly 27m this morning. On exam patient noted with erythematous rash on his face, chest and back, does not appear to be in any acute distress, denied any pain. The only new medication patient has received is Lasix 479mwhich he was discharged on during last hospitalization  on 07/08/2020. Plan/recommendation: Hold Lasix for the next 2 days, patient is scheduled to see his PCP in 2 days, PCP to determine need for continuing med or changing to another diuretic. Continue over the counter Benadryl 2513mvery 6hrs for 24 hrs, then use as needed every 6hrs for itching. Wife advised to call PCP if worsening rash, She is advised to call 911 for SOB or anaphylasxis. Wife verbalized understanding of plan.  3. Follow up Palliative Care Visit: Palliative care will continue to follow for goals of care clarification and symptom management. Return in about 4 weeks or prn.  I spent 30 minutes providing this consultation, time includes time spent with patient and family, chart review, and documentation. More than 50% of the time in this consultation was spent in counseling and coordination of care.   HISTORY OF PRESENT ILLNESS:  DonDONALD Murphy a 88 31o. year old male with multiple medical problems including Parkinson's disease, chronic combined systoic and diastolic CHF (EF 35-77-11% March 2021), CKD, 3, dementia (FAST 6e), paroxysmal A-fib (not on anticoagulation due to falls), moderate aortic insufficiency, pulmonary hypertension, sinus brady cardia, HTN, HLD. Patient was Hospitalized from 07/05/2020 to 07/08/2020 for CHF exercabation where he was diuresed 6L, he was discharged on Lasix 36m63m mouth daily.  CODE STATUS: DNR  PPS: 30%  HOSPICE ELIGIBILITY/DIAGNOSIS: TBD  PHYSICAL EXAM / ROS:   General:  frail appearing, sitting in a recliner in his bedroom in NAD Cardiovascular: no chest pain reported, no edema  Pulmonary: no cough, no increased SOB, room air Skin: erythematous rash noted to face, neck chest and back of patient, no blisters  Neurological: weakness, but otherwise nonfocal  PAST MEDICAL HISTORY:  Past Medical History:  Diagnosis Date  . Chronic combined systolic and diastolic CHF (congestive heart failure) ([facility])   . CKD (chronic kidney disease), stage III  ([facility] [facility])    a. stage II-III by labs, Cr 1.1-1.22 in 09/2017, 1.3-1.4 in 06/2018.  Marland Kitchen Dementia ([place] [facility])   . Fatigue   . Frequent falls   . Hearing loss of both ears   . History of orthostatic hypotension   . Hyperlipidemia   . Hypersomnia, periodic 02/18/2014  . Hypertension   . Insomnia   . Junctional bradycardia   . Moderate aortic insufficiency   . Osteopenia   . PAF (paroxysmal atrial fibrillation) ([facility])    a. noted in hospital 06/2018, not on anticoag due to h/o frequent falling.  . Parkinson's disease    Possible early parkinsons disease  . Pulmonary hypertension ([place])   . Sinus bradycardia   . Tricuspid regurgitation     SOCIAL HX:  Social History   Tobacco Use  . Smoking status: Never Smoker  . Smokeless tobacco: Never Used  Substance Use Topics  . Alcohol use: No   FAMILY HX:  Family History  Problem Relation Age of Onset  . Stroke Sister     ALLERGIES: No Known Allergies   PERTINENT MEDICATIONS:  Outpatient Encounter Medications as of 07/14/2020  Medication Sig  . AMBULATORY NON FORMULARY MEDICATION Lift Chair DX: G20  . buPROPion (WELLBUTRIN SR) 100 MG 12 hr tablet Take 100 mg by mouth daily.  . carbidopa-levodopa (SINEMET CR) 50-200 MG tablet TAKE 1 TABLET BY MOUTH AT  BEDTIME  . Carbidopa-Levodopa ER (SINEMET CR) 25-100 MG tablet controlled release Take 2 tabs in the morning; 2 tabs in the afternoon and 1 tab in the evening (Patient taking differently: Take 1-2 tablets by mouth See admin instructions. Take 2 tabs in the morning; 2 tabs in the afternoon and 1 tab in the evening)  . chlorhexidine (PERIDEX) 0.12 % solution 15 mLs by Mouth Rinse route at bedtime. Rinse and spit  . cholecalciferol (VITAMIN D3) 25 MCG (1000 UT) tablet Take 1,000 Units by mouth daily.  . clonazePAM (KLONOPIN) 0.5 MG tablet Take 1 tablet (0.5 mg total) by mouth at bedtime.  Marland Kitchen Co-Enzyme Q-10 100 MG CAPS Take 200 mg by mouth at bedtime.   . finasteride (PROSCAR) 5 MG tablet Take 5 mg by  mouth daily.    . furosemide (LASIX) 40 MG tablet Take 1 tablet (40 mg total) by mouth daily. After a week reassess tocut down to 20 mg daily.  Marland Kitchen ibandronate (BONIVA) 150 MG tablet Take 150 mg by mouth every 30 (thirty) days. Take in the morning with a full glass of water, on an empty stomach, and do not take anything else by mouth or lie down for the next 30 min.  . Multiple Vitamin (MULTIVITAMIN WITH MINERALS) TABS tablet Take 1 tablet by mouth daily.  . Multiple Vitamins-Minerals (PRESERVISION AREDS 2 PO) Take 1 capsule by mouth 2 (two) times daily.  Debby Freiberg Tartrate (NUPLAZID) 34 MG CAPS Take 1 capsule (34 mg total) by mouth daily.  . Probiotic Product (PROBIOTIC-10 PO) Take 1 tablet by mouth daily.  . psyllium (METAMUCIL) 58.6 % powder Take 1 packet by mouth daily as needed (fiber).   No facility-administered encounter medications on file as of 07/14/2020.    Jari Favre, DNP, AGPCNP-BC

## 2020-07-18 NOTE — Telephone Encounter (Signed)
Created in error

## 2020-08-01 ENCOUNTER — Ambulatory Visit: Payer: Medicare Other | Admitting: Cardiology

## 2020-08-08 ENCOUNTER — Encounter: Payer: Self-pay | Admitting: Neurology

## 2020-08-08 NOTE — Progress Notes (Signed)
Eino Farber (Key: [id number]) Nuplazid 34MG  capsules   Form [employer] Medicare Part D Electronic Prior Authorization Form (2017 NCPDP) Created 2 hours ago Sent to Plan 25 minutes ago Plan Response 22 minutes ago Submit Clinical Questions 21 minutes ago Determination Favorable 1 minute ago Per the Manufacturer, NUPLAZID (pimavanserin) is available through 6 pharmacies: LEARN ABOUT ACCESS SUPPORT - [facility] - [facility] - [facility] - [facility] - [facility] - [facility]  These pharmacies can offer guidance on financial and prior authorization assistance. If you need additional assistance please call [phone]. Please read the Full Prescribing Information, including Boxed WARNING.  Message from plan: Request Reference Number: [id number]. NUPLAZID CAP 34MG  is approved through 08/29/2021. Your patient may now fill this prescription and it will be covered.

## 2020-09-17 ENCOUNTER — Telehealth: Payer: Self-pay | Admitting: Neurology

## 2020-09-17 NOTE — Telephone Encounter (Signed)
I have no idea.  They will need to find out from the funding place or from [place] (the maker of nuplazid) and we can send where they would like

## 2020-09-17 NOTE — Telephone Encounter (Signed)
Spoke to patients wife and informed her that she would have to reach out to the funding place or [place]. Once she finds out from them she may call us back with information where the medication needs to be sent.  Patients wife verbalized understanding.

## 2020-09-17 NOTE — Telephone Encounter (Signed)
Received a call that patient has funding to get medication Nuplazid from [employer]. They are needing to find out what pharmacy would dispense that for the patient with the funding? Please call

## 2020-09-18 ENCOUNTER — Telehealth: Payer: Self-pay | Admitting: Neurology

## 2020-09-18 NOTE — Telephone Encounter (Signed)
Patient's wife called back in after speaking with the insurance company. The Nuplazid prescription can be sent to [facility] on [address]

## 2020-09-19 ENCOUNTER — Other Ambulatory Visit: Payer: Self-pay

## 2020-09-19 MED ORDER — NUPLAZID 34 MG PO CAPS
34.0000 mg | ORAL_CAPSULE | Freq: Every day | ORAL | 0 refills | Status: DC
Start: 2020-09-19 — End: 2020-10-01

## 2020-09-30 ENCOUNTER — Telehealth: Payer: Self-pay | Admitting: Neurology

## 2020-09-30 NOTE — Telephone Encounter (Signed)
Left message informing patients wife that I would place samples upfront. Advised a call back with any questions or concerns.

## 2020-09-30 NOTE — Telephone Encounter (Signed)
Patient's wife called in wanting to get some samples of Nuplazid. She was told last week she could come get some.

## 2020-10-01 ENCOUNTER — Telehealth: Payer: Self-pay

## 2020-10-01 MED ORDER — NUPLAZID 34 MG PO CAPS: 34.0000 mg | ORAL_CAPSULE | Freq: Every day | ORAL | 0 refills | Status: AC

## 2020-10-01 NOTE — Telephone Encounter (Signed)
Patients wife came tot he office to pick up samples of Nuplaizd.   She states she spoke with [employer] and they told her they would cover Nuplazid. She requested a refill sent to [facility].  Rx(s) sent to pharmacy electronically.   Contacted [facility] and was informed the last rx refill they received was on 09/13/2020.

## 2020-10-09 ENCOUNTER — Telehealth: Payer: Self-pay | Admitting: Neurology

## 2020-10-09 NOTE — Telephone Encounter (Signed)
Judeth Cornfield from [facility] called regarding patient's Nuplazid prescription. They were told by the patient that he has heart problems, so she wanted to call to verify that Dr Tat wants to order it for patient and make sure she is aware that Nuplazid can cause QT prolongation.

## 2020-10-09 NOTE — Telephone Encounter (Signed)
Tell Judeth Cornfield I am well aware as a physician and prescriber of this med (07/2018 note this is well documented and discussed with patient when we prescribed it).  Pt has been on it for years.  Wife well aware of QT issues.  This allows his wife to take care of him in the home

## 2020-10-10 NOTE — Telephone Encounter (Signed)
Spoke with Judeth Cornfield and made her aware of Dr Don Perking recommendations. She voiced understanding.

## 2020-11-12 ENCOUNTER — Telehealth: Payer: Medicare Other | Admitting: Neurology

## 2020-12-08 ENCOUNTER — Telehealth: Payer: Self-pay | Admitting: Neurology

## 2020-12-08 ENCOUNTER — Other Ambulatory Visit: Payer: Self-pay | Admitting: Neurology

## 2020-12-28 DEATH — deceased

## 2021-02-16 IMAGING — CT CT HEAD W/O CM
4 series · 16 of 47 positions shown, 18 images · non-contrast
Comparison: CT 10/03/2018

CLINICAL DATA: Encephalopathy, hypertension, frequent falls and
dementia

EXAM:
CT HEAD WITHOUT CONTRAST
TECHNIQUE: Contiguous axial images were obtained from the base of the skull
through the vertex without intravenous contrast.

[Series 3: head without · axial · non-contrast · 0.46mm/px · z∈[-122,+18]mm · 7 of 38 slices shown, 9 images]
[im 5/38  brain]
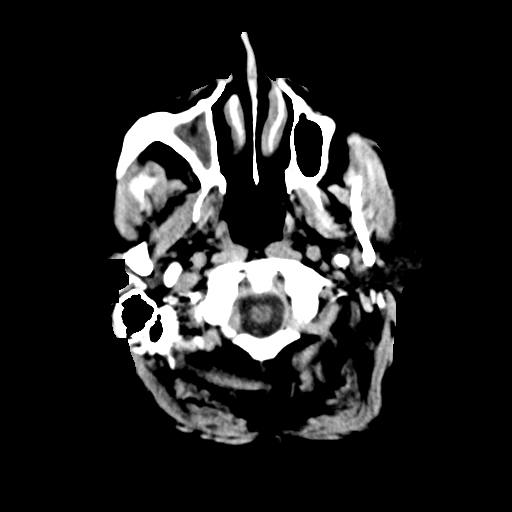
[im 5/38  bone]
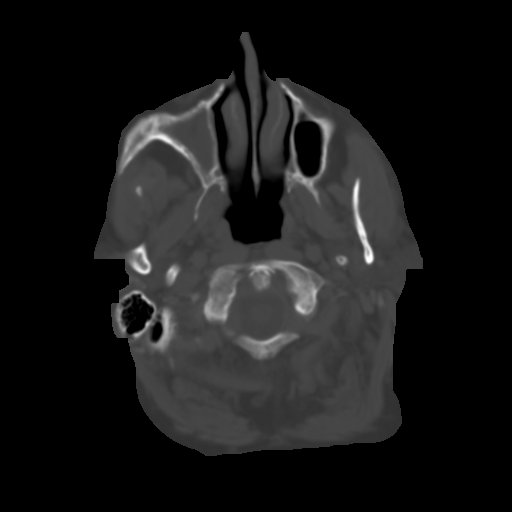
[im 10/38  brain]
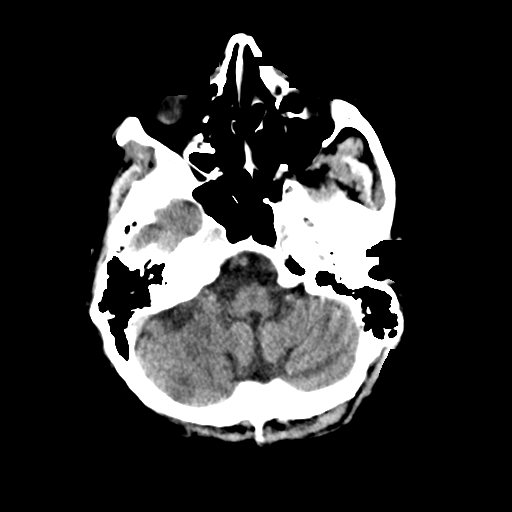
[im 14/38  brain]
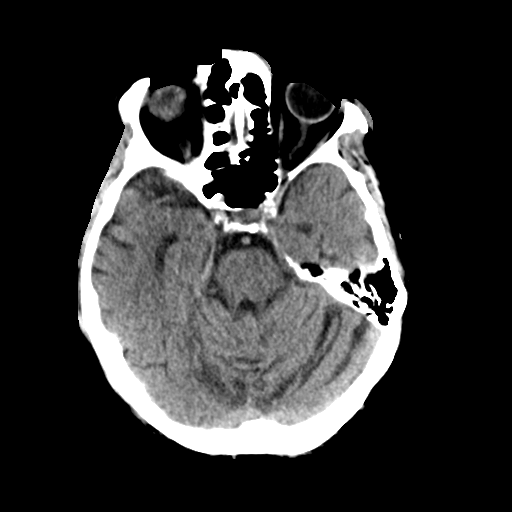
[im 19/38  brain]
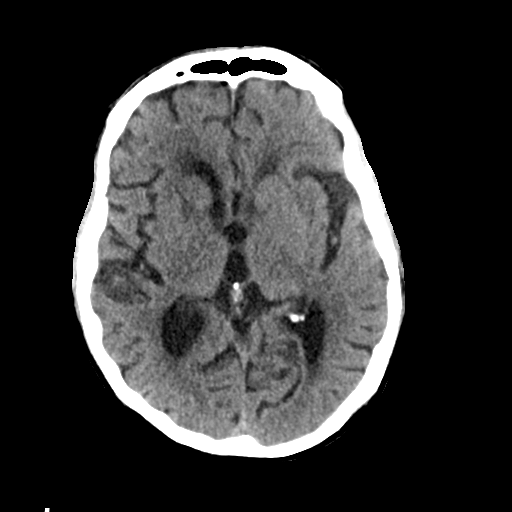
[im 24/38  brain]
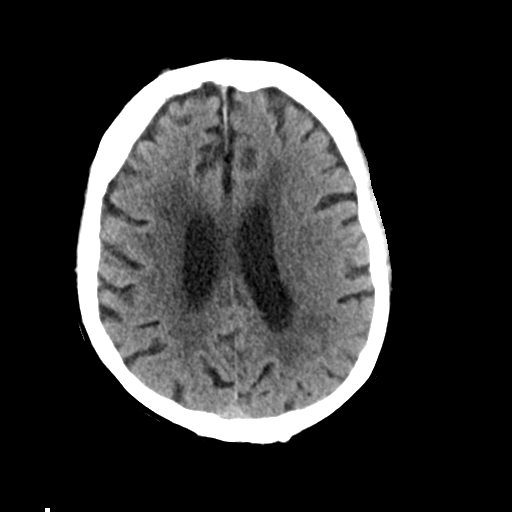
[im 24/38  bone]
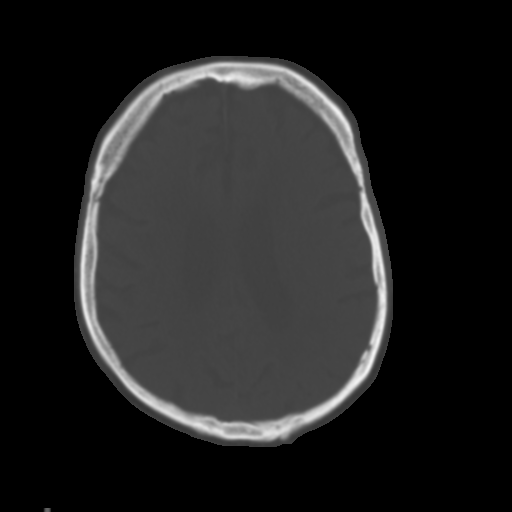
[im 28/38  brain]
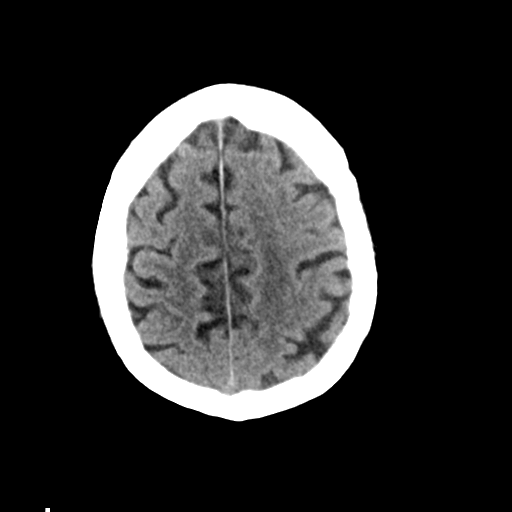
[im 33/38  brain]
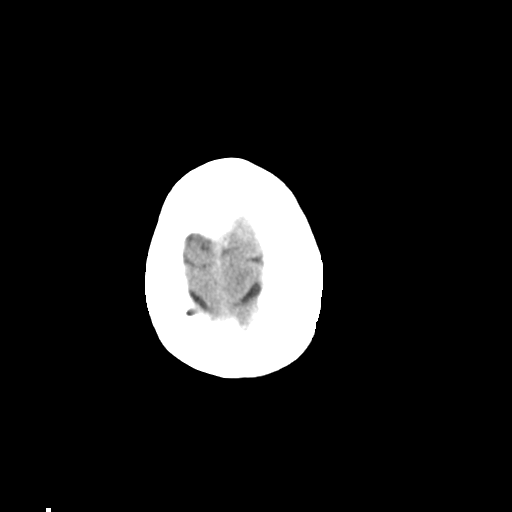

[Series 4: head bone · axial · 0.46mm/px · z∈[-124,-88]mm · 3 of 94 slices shown]
[im 10/94  bone]
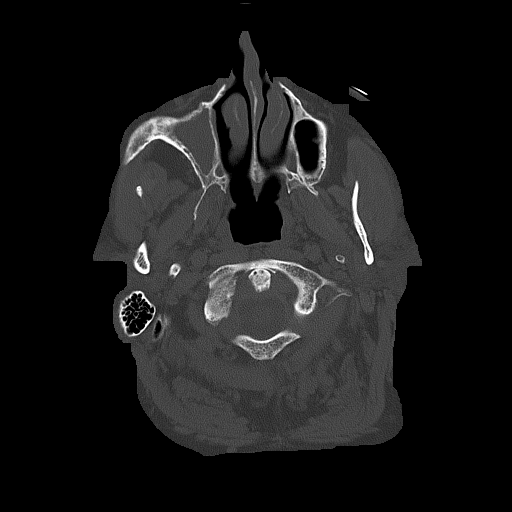
[im 19/94  bone]
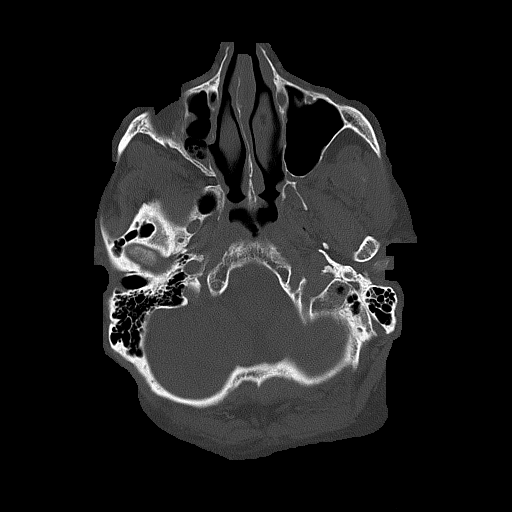
[im 28/94  bone]
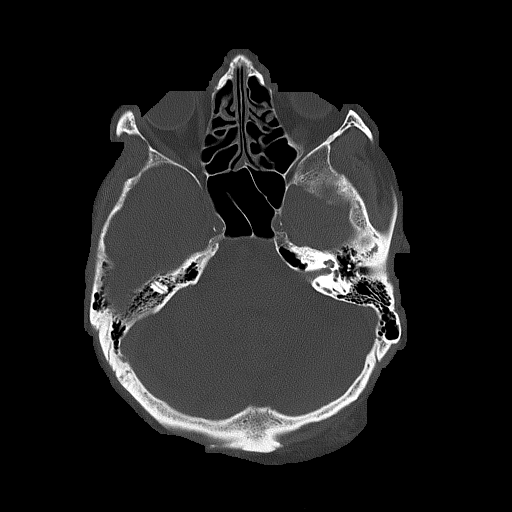

[Series 5: head without cor · coronal · non-contrast · 0.36mm/px · 3 of 69 slices shown]
[im 23/69  brain]
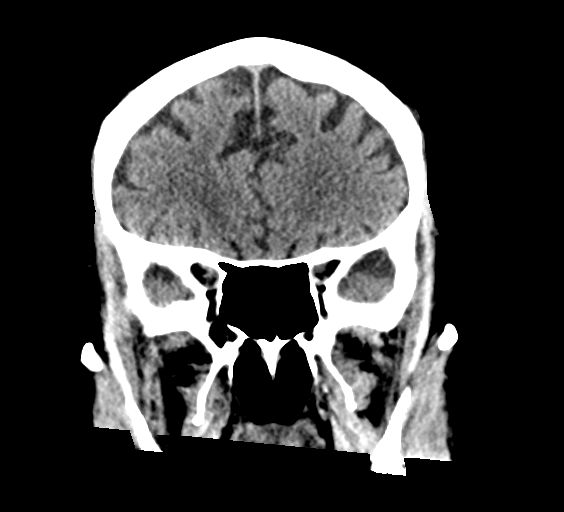
[im 31/69  brain]
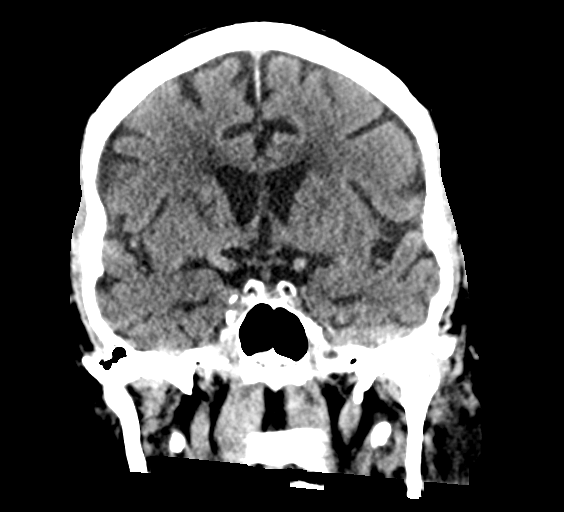
[im 38/69  brain]
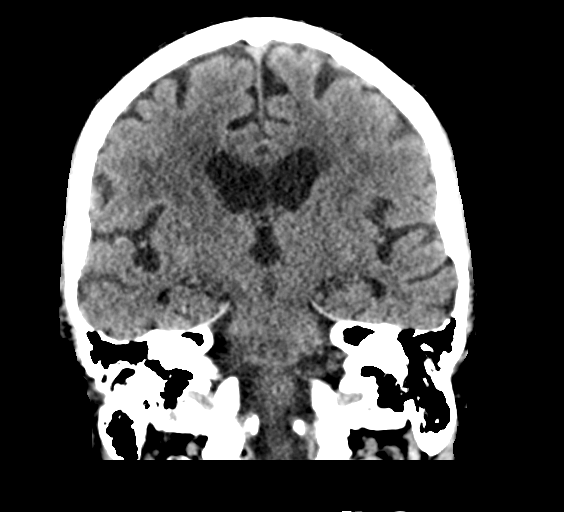

[Series 6: head without sag · sagittal · non-contrast · 0.38mm/px · 3 of 67 slices shown]
[im 23/67  brain]
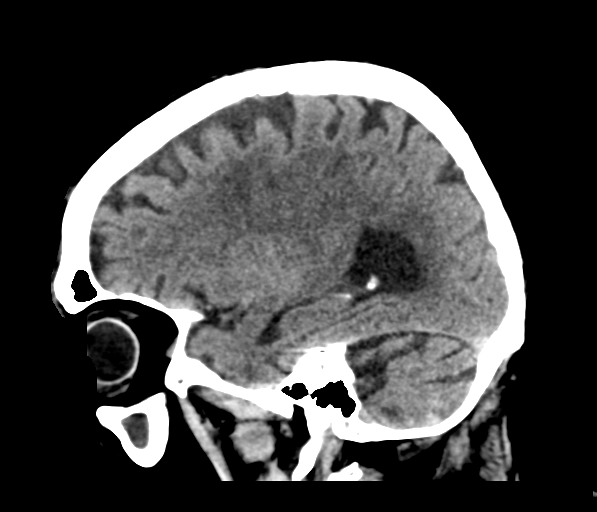
[im 34/67  brain]
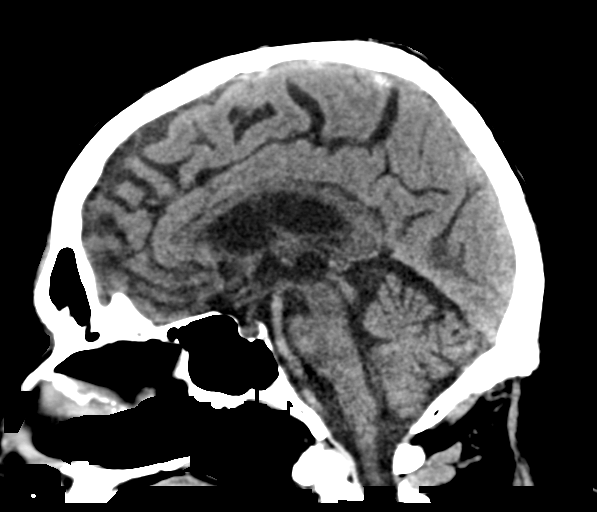
[im 45/67  brain]
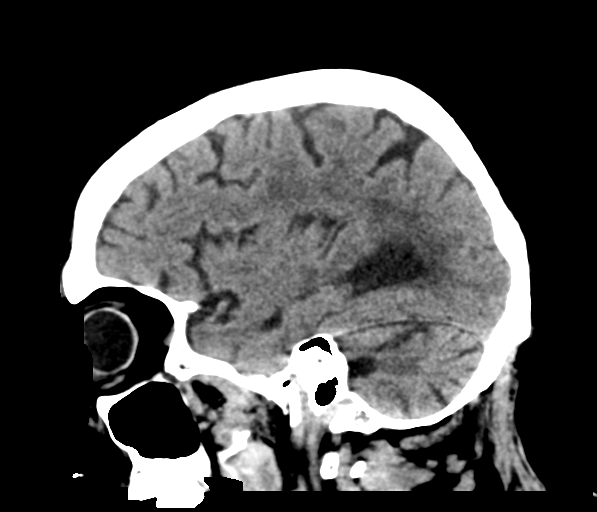

[16 of 47 positions shown; findings below may reference images not displayed]

FINDINGS: Brain: A hypoattenuating focus in the left thalamus has a subacute
to chronic appearance favoring remote lacunar infarct though is new
since the October 03, 2018. Additional areas of lacunar infarcts are
present in the bilateral basal ganglia particularly within the
internal capsules and right caudate. No evidence of acute
infarction, hemorrhage, hydrocephalus, extra-axial collection or
mass lesion/mass effect. Symmetric prominence of the ventricles,
cisterns and sulci compatible with parenchymal volume loss. Patchy
areas of white matter hypoattenuation are most compatible with
chronic microvascular angiopathy.

Vascular: Atherosclerotic calcification of the carotid siphons and
intradural vertebral arteries. No hyperdense vessel.

Skull: No calvarial fracture or suspicious osseous lesion. No scalp
swelling or hematoma.

Sinuses/Orbits: There is circumferential mucosal thickening and
hyperostotic changes of the right maxillary sinus with a slightly
atelectatic appearance when compared to the contralateral maxillary
sinus. Remaining paranasal sinuses and mastoid air cells are
predominantly clear.

Other: None
IMPRESSION: 1. No acute intracranial abnormality.
2. Subacute to chronic lacunar infarct in the left thalamus is new
since October 03, 2018. Additional areas of remote lacunar infarcts
in the bilateral basal ganglia particularly within the internal
capsules and right caudate.
3. Chronic microvascular angiopathy and mild parenchymal volume
loss.
4. Chronic right maxillary sinusitis with features of sinus
atelectasis.
